# Patient Record
Sex: Female | Born: 1950 | Race: White | Hispanic: No | Marital: Married | Smoking: Former smoker
Health system: Southern US, Community
[De-identification: ages and names within clinical notes are randomized; demographics above are authoritative.]

## PROBLEM LIST (undated history)

## (undated) DIAGNOSIS — M1990 Unspecified osteoarthritis, unspecified site: Secondary | ICD-10-CM

## (undated) DIAGNOSIS — G4730 Sleep apnea, unspecified: Secondary | ICD-10-CM

## (undated) DIAGNOSIS — I10 Essential (primary) hypertension: Secondary | ICD-10-CM

## (undated) DIAGNOSIS — Z8709 Personal history of other diseases of the respiratory system: Secondary | ICD-10-CM

## (undated) DIAGNOSIS — U071 COVID-19: Secondary | ICD-10-CM

## (undated) DIAGNOSIS — K219 Gastro-esophageal reflux disease without esophagitis: Secondary | ICD-10-CM

## (undated) DIAGNOSIS — E119 Type 2 diabetes mellitus without complications: Secondary | ICD-10-CM

## (undated) DIAGNOSIS — M2550 Pain in unspecified joint: Secondary | ICD-10-CM

## (undated) DIAGNOSIS — Z9289 Personal history of other medical treatment: Secondary | ICD-10-CM

## (undated) DIAGNOSIS — M2540 Effusion, unspecified joint: Secondary | ICD-10-CM

## (undated) DIAGNOSIS — R7303 Prediabetes: Secondary | ICD-10-CM

## (undated) DIAGNOSIS — R350 Frequency of micturition: Secondary | ICD-10-CM

## (undated) DIAGNOSIS — R3915 Urgency of urination: Secondary | ICD-10-CM

## (undated) DIAGNOSIS — J189 Pneumonia, unspecified organism: Secondary | ICD-10-CM

## (undated) DIAGNOSIS — K5900 Constipation, unspecified: Secondary | ICD-10-CM

## (undated) DIAGNOSIS — E785 Hyperlipidemia, unspecified: Secondary | ICD-10-CM

## (undated) DIAGNOSIS — F419 Anxiety disorder, unspecified: Secondary | ICD-10-CM

## (undated) DIAGNOSIS — R0600 Dyspnea, unspecified: Secondary | ICD-10-CM

## (undated) HISTORY — PX: CHOLECYSTECTOMY: SHX55

## (undated) HISTORY — PX: ABDOMINAL HYSTERECTOMY: SHX81

## (undated) HISTORY — PX: APPENDECTOMY: SHX54

## (undated) HISTORY — PX: TONSILLECTOMY: SUR1361

## (undated) HISTORY — PX: OTHER SURGICAL HISTORY: SHX169

## (undated) HISTORY — PX: COLONOSCOPY: SHX174

## (undated) HISTORY — PX: CATARACT EXTRACTION W/ INTRAOCULAR LENS IMPLANT: SHX1309

---
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
## 1970-03-31 HISTORY — PX: ABDOMINAL HYSTERECTOMY: SHX81

## 2014-05-11 ENCOUNTER — Other Ambulatory Visit: Payer: Self-pay | Admitting: Orthopaedic Surgery

## 2014-05-18 NOTE — Pre-Procedure Instructions (Signed)
Karen Baker  05/18/2014   Your procedure is scheduled on:  Tues, Mar 1 @ 12:40 PM  Report to [facility]  at 10:40 AM.  Call this number if you have problems the morning of surgery: [phone]   Remember:   Do not eat food or drink liquids after midnight.   Take these medicines the morning of surgery with A SIP OF WATER: Alprazolam(Xanax),Amlodipine(Norvasc),Pain Pill(if needed),and Omeprazole(Prilosec)              Stop taking your Naproxen. No Goody's,BC's,Aspirin,Ibuprofen,Fish Oil,or any Herbal Medications   Do not wear jewelry, make-up or nail polish.  Do not wear lotions, powders, or perfumes. You may wear deodorant.  Do not shave 48 hours prior to surgery.   Do not bring valuables to the hospital.  [facility] is not responsible                  for any belongings or valuables.               Contacts, dentures or bridgework may not be worn into surgery.  Leave suitcase in the car. After surgery it may be brought to your room.  For patients admitted to the hospital, discharge time is determined by your                treatment team.                   Special Instructions:    Please read over the following fact sheets that you were given: Pain Booklet, Coughing and Deep Breathing, MRSA Information and Surgical Site Infection Prevention

## 2014-05-19 ENCOUNTER — Encounter (HOSPITAL_COMMUNITY): Payer: Self-pay

## 2014-05-19 ENCOUNTER — Encounter (HOSPITAL_COMMUNITY)
Admission: RE | Admit: 2014-05-19 | Discharge: 2014-05-19 | Disposition: A | Discharge status: Home / Self Care | Payer: Medicare PPO | Source: Ambulatory Visit | Attending: Orthopaedic Surgery | Admitting: Orthopaedic Surgery

## 2014-05-19 DIAGNOSIS — Z87891 Personal history of nicotine dependence: Secondary | ICD-10-CM | POA: Insufficient documentation

## 2014-05-19 DIAGNOSIS — I10 Essential (primary) hypertension: Secondary | ICD-10-CM | POA: Insufficient documentation

## 2014-05-19 DIAGNOSIS — Z01818 Encounter for other preprocedural examination: Secondary | ICD-10-CM

## 2014-05-19 DIAGNOSIS — F419 Anxiety disorder, unspecified: Secondary | ICD-10-CM | POA: Insufficient documentation

## 2014-05-19 DIAGNOSIS — M1612 Unilateral primary osteoarthritis, left hip: Secondary | ICD-10-CM | POA: Insufficient documentation

## 2014-05-19 DIAGNOSIS — R001 Bradycardia, unspecified: Secondary | ICD-10-CM | POA: Insufficient documentation

## 2014-05-19 DIAGNOSIS — E785 Hyperlipidemia, unspecified: Secondary | ICD-10-CM | POA: Diagnosis not present

## 2014-05-19 DIAGNOSIS — Z01812 Encounter for preprocedural laboratory examination: Secondary | ICD-10-CM | POA: Insufficient documentation

## 2014-05-19 DIAGNOSIS — Z79899 Other long term (current) drug therapy: Secondary | ICD-10-CM | POA: Insufficient documentation

## 2014-05-19 DIAGNOSIS — K219 Gastro-esophageal reflux disease without esophagitis: Secondary | ICD-10-CM | POA: Diagnosis not present

## 2014-05-19 DIAGNOSIS — I4510 Unspecified right bundle-branch block: Secondary | ICD-10-CM | POA: Insufficient documentation

## 2014-05-19 HISTORY — DX: Essential (primary) hypertension: I10

## 2014-05-19 HISTORY — DX: Hyperlipidemia, unspecified: E78.5

## 2014-05-19 HISTORY — DX: Pain in unspecified joint: M25.50

## 2014-05-19 HISTORY — DX: Anxiety disorder, unspecified: F41.9

## 2014-05-19 HISTORY — DX: Effusion, unspecified joint: M25.40

## 2014-05-19 HISTORY — DX: Unspecified osteoarthritis, unspecified site: M19.90

## 2014-05-19 HISTORY — DX: Frequency of micturition: R35.0

## 2014-05-19 HISTORY — DX: Gastro-esophageal reflux disease without esophagitis: K21.9

## 2014-05-19 HISTORY — DX: Personal history of other medical treatment: Z92.89

## 2014-05-19 HISTORY — DX: Constipation, unspecified: K59.00

## 2014-05-19 HISTORY — DX: Personal history of other diseases of the respiratory system: Z87.09

## 2014-05-19 HISTORY — DX: Urgency of urination: R39.15

## 2014-05-19 LAB — BASIC METABOLIC PANEL
Anion gap: 8 (ref 5–15)
BUN: 14 mg/dL (ref 6–23)
CALCIUM: 9.8 mg/dL (ref 8.4–10.5)
CO2: 26 mmol/L (ref 19–32)
Chloride: 105 mmol/L (ref 96–112)
Creatinine, Ser: 0.67 mg/dL (ref 0.50–1.10)
GFR calc Af Amer: >90 mL/min (ref >90)
GFR calc non Af Amer: >90 mL/min (ref >90)
GLUCOSE: 97 mg/dL (ref 70–99)
POTASSIUM: 4.5 mmol/L (ref 3.5–5.1)
Sodium: 139 mmol/L (ref 135–145)

## 2014-05-19 LAB — SURGICAL PCR SCREEN
MRSA, PCR: NEGATIVE
Staphylococcus aureus: POSITIVE — AB

## 2014-05-19 LAB — URINALYSIS, ROUTINE W REFLEX MICROSCOPIC
BILIRUBIN URINE: NEGATIVE
Glucose, UA: NEGATIVE mg/dL
Hgb urine dipstick: NEGATIVE
Ketones, ur: NEGATIVE mg/dL
Leukocytes, UA: NEGATIVE
NITRITE: NEGATIVE
PROTEIN: NEGATIVE mg/dL
SPECIFIC GRAVITY, URINE: 1.017 (ref 1.005–1.030)
UROBILINOGEN UA: 0.2 mg/dL (ref 0.0–1.0)
pH: 5.5 (ref 5.0–8.0)

## 2014-05-19 LAB — CBC WITH DIFFERENTIAL/PLATELET
Basophils Absolute: 0.1 10*3/uL (ref 0.0–0.1)
Basophils Relative: 1 % (ref 0–1)
EOS ABS: 0.4 10*3/uL (ref 0.0–0.7)
EOS PCT: 4 % (ref 0–5)
HCT: 41.3 % (ref 36.0–46.0)
Hemoglobin: 14.2 g/dL (ref 12.0–15.0)
LYMPHS ABS: 2.9 10*3/uL (ref 0.7–4.0)
Lymphocytes Relative: 27 % (ref 12–46)
MCH: 30 pg (ref 26.0–34.0)
MCHC: 34.4 g/dL (ref 30.0–36.0)
MCV: 87.1 fL (ref 78.0–100.0)
MONO ABS: 0.9 10*3/uL (ref 0.1–1.0)
Monocytes Relative: 9 % (ref 3–12)
NEUTROS ABS: 6.4 10*3/uL (ref 1.7–7.7)
NEUTROS PCT: 59 % (ref 43–77)
PLATELETS: 264 10*3/uL (ref 150–400)
RBC: 4.74 MIL/uL (ref 3.87–5.11)
RDW: 12.7 % (ref 11.5–15.5)
WBC: 10.7 10*3/uL — ABNORMAL HIGH (ref 4.0–10.5)

## 2014-05-19 LAB — PROTIME-INR
INR: 1.04 (ref 0.00–1.49)
PROTHROMBIN TIME: 13.7 s (ref 11.6–15.2)

## 2014-05-19 LAB — APTT: APTT: 37 s (ref 24–37)

## 2014-05-19 MED ORDER — CHLORHEXIDINE GLUCONATE 4 % EX LIQD: 60.0000 mL | Freq: Once | CUTANEOUS | Status: DC

## 2014-05-19 NOTE — Progress Notes (Signed)
Mupirocin script called into [facility] in [place]

## 2014-05-19 NOTE — Progress Notes (Addendum)
Medical Md is Dr.Sami Sheryle Hail  Pt doesn't have a cardiologist  Denies EKG or CXR in past yr  Denies ever having an echo/stress test/heart cath

## 2014-05-23 NOTE — Progress Notes (Addendum)
Anesthesia Chart Review:  Patient is a 64 year old female scheduled for left THA, anterior approach on 05/30/14 by Dr. Rhona Raider.  History includes former smoker, HTN, HLD, anxiety, GERD, arthritis, hysterectomy, tonsillectomy. PCP is Dr. Antonietta Jewel [phone]).   Meds Xanax, amlodipine, lovastatin, omeprazole.   EKG 05/19/14: SB at 59 bpm, LAD/LAFB, right BBB, minimal voltage criteria for LVH, may be normal variant, pulmonary disease pattern. No old tracing to compare in [place], or at her PCP office.   05/19/14 CXR: 1. Left base pleural-parenchymal scarring. 2. No acute cardiopulmonary disease.  Preoperative labs noted.   I requested prior EKG if available from [facility]. Records are pending.  In the interim, I did review her PAT EKG with Dr. Jenita Seashore. If patient is asymptomatic from a CV standpoint then would anticipate that she could proceed as planned.  I'll follow-up once I hear back from [facility].  [facility] Short Stay Center/Anesthesiology Phone [phone] 05/23/2014 5:28 PM  Addendum: Stress echo from 03/26/09 received from [facility] with results showing: Normal exercise tolerance, inconclusive EKG due to underlying conduction delay, negative stress echo for ischemia. Baseline EKG was noted to have SR with right BBB, LAFB, bifascicular block which would be consistent with her 05/19/14 EKG. Echo portion showed impaired LV relaxation and mild TR, EF 60%.  [facility] Short Stay Center/Anesthesiology Phone [phone] 05/24/2014 9:28 AM

## 2014-05-26 NOTE — H&P (Signed)
TOTAL HIP ADMISSION H&P  Patient is admitted for left total hip arthroplasty.  Subjective:  Chief Complaint: left hip pain  HPI: [facility], 64 y.o. female, has a history of pain and functional disability in the left hip(s) due to arthritis and patient has failed non-surgical conservative treatments for greater than 12 weeks to include NSAID's and/or analgesics, flexibility and strengthening excercises, use of assistive devices, weight reduction as appropriate and activity modification.  Onset of symptoms was gradual starting 5 years ago with gradually worsening course since that time.The patient noted no past surgery on the left hip(s).  Patient currently rates pain in the left hip at 10 out of 10 with activity. Patient has night pain, worsening of pain with activity and weight bearing, pain that interfers with activities of daily living and crepitus. Patient has evidence of subchondral cysts, subchondral sclerosis, periarticular osteophytes and joint space narrowing by imaging studies. This condition presents safety issues increasing the risk of falls. There is no current active infection.  There are no active problems to display for this patient.  Past Medical History  Diagnosis Date  . Hypertension     takes Amlodipine daily  . Anxiety     takes Xanax daily  . Constipation     takes Colace daily  . Hyperlipidemia     takes Lovastatin daily  . GERD (gastroesophageal reflux disease)     takes Omeprazole daily  . History of bronchitis 3-55yrs ago  . Arthritis   . Joint pain   . Joint swelling   . Urinary frequency   . Urinary urgency   . History of blood transfusion     no abnormal reaction noted    Past Surgical History  Procedure Laterality Date  . Cyst removed from ovary    . Tonsillectomy    . 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 Knots removed from neck    . Knee athroscopy Right   . Abdominal hysterectomy    . Appendectomy    . Colonoscopy      No  prescriptions prior to admission   Allergies  Allergen Reactions  . Aspirin     States she just doesn't take it.    History  Substance Use Topics  . Smoking status: Former Smoker -- 1.00 packs/day for 20 years  . Smokeless tobacco: Not on file     Comment: no cigs in over a week  . Alcohol Use: No    No family history on file.   Review of Systems  Musculoskeletal: Positive for joint pain.       Left hip  All other systems reviewed and are negative.   Objective:  Physical Exam  Constitutional: She is oriented to person, place, and time. She appears well-developed and well-nourished.  HENT:  Head: Normocephalic and atraumatic.  Eyes: Conjunctivae are normal. Pupils are equal, round, and reactive to light.  Neck: Normal range of motion.  Cardiovascular: Normal rate and regular rhythm.   Respiratory: Effort normal.  GI: Soft.  Musculoskeletal:  Left hip motion is quite limited.  She has extreme pain on internal rotation.  Her leg lengths seem about equal.  Sensation and motor function are intact in her feet with palpable pulses on both sides.  She has normal unlabored respirations.  There is no palpable lymphadenopathy about either groin area  Neurological: She is alert and oriented to person, place, and time.  Skin: Skin is warm and dry.  Psychiatric: She has a normal  mood and affect. Her behavior is normal. Judgment and thought content normal.    Vital signs in last 24 hours:    Labs:   There is no height or weight on file to calculate BMI.   Imaging Review Plain radiographs demonstrate severe degenerative joint disease of the left hip(s). The bone quality appears to be good for age and reported activity level.  Assessment/Plan:  End stage primary arthritis, left hip(s)  The patient history, physical examination, clinical judgement of the provider and imaging studies are consistent with end stage degenerative joint disease of the left hip(s) and total hip  arthroplasty is deemed medically necessary. The treatment options including medical management, injection therapy, arthroscopy and arthroplasty were discussed at length. The risks and benefits of total hip arthroplasty were presented and reviewed. The risks due to aseptic loosening, infection, stiffness, dislocation/subluxation,  thromboembolic complications and other imponderables were discussed.  The patient acknowledged the explanation, agreed to proceed with the plan and consent was signed. Patient is being admitted for inpatient treatment for surgery, pain control, PT, OT, prophylactic antibiotics, VTE prophylaxis, progressive ambulation and ADL's and discharge planning.The patient is planning to be discharged home with home health services

## 2014-05-29 MED ORDER — CEFAZOLIN SODIUM-DEXTROSE 2-3 GM-% IV SOLR
2.0000 g | INTRAVENOUS | Status: AC
  Administered 2014-05-30: 2 g via INTRAVENOUS
  Filled 2014-05-29: qty 50

## 2014-05-29 MED ORDER — LACTATED RINGERS IV SOLN
INTRAVENOUS | Status: DC
  Administered 2014-05-30: 10:00:00 via INTRAVENOUS

## 2014-05-29 NOTE — Progress Notes (Signed)
Pt notified of time change;to arrive at [facility]

## 2014-05-30 ENCOUNTER — Encounter (HOSPITAL_COMMUNITY): Payer: Self-pay | Admitting: *Deleted

## 2014-05-30 ENCOUNTER — Encounter (HOSPITAL_COMMUNITY)
Admission: RE | Disposition: A | Discharge status: Home / Self Care | Payer: Self-pay | Source: Ambulatory Visit | Attending: Orthopaedic Surgery

## 2014-05-30 ENCOUNTER — Inpatient Hospital Stay (HOSPITAL_COMMUNITY): Payer: Medicare PPO | Admitting: Anesthesiology

## 2014-05-30 ENCOUNTER — Inpatient Hospital Stay (HOSPITAL_COMMUNITY)
Admission: RE | Admit: 2014-05-30 | Discharge: 2014-06-01 | DRG: 470 | Disposition: A | Discharge status: Home / Self Care | Payer: Medicare PPO | Source: Ambulatory Visit | Attending: Orthopaedic Surgery | Admitting: Orthopaedic Surgery

## 2014-05-30 ENCOUNTER — Inpatient Hospital Stay (HOSPITAL_COMMUNITY): Payer: Medicare PPO | Admitting: Vascular Surgery

## 2014-05-30 ENCOUNTER — Inpatient Hospital Stay (HOSPITAL_COMMUNITY): Payer: Medicare PPO

## 2014-05-30 DIAGNOSIS — F419 Anxiety disorder, unspecified: Secondary | ICD-10-CM | POA: Diagnosis not present

## 2014-05-30 DIAGNOSIS — E785 Hyperlipidemia, unspecified: Secondary | ICD-10-CM | POA: Diagnosis present

## 2014-05-30 DIAGNOSIS — Z90710 Acquired absence of both cervix and uterus: Secondary | ICD-10-CM | POA: Diagnosis not present

## 2014-05-30 DIAGNOSIS — Z886 Allergy status to analgesic agent status: Secondary | ICD-10-CM | POA: Diagnosis not present

## 2014-05-30 DIAGNOSIS — K219 Gastro-esophageal reflux disease without esophagitis: Secondary | ICD-10-CM | POA: Diagnosis present

## 2014-05-30 DIAGNOSIS — I10 Essential (primary) hypertension: Secondary | ICD-10-CM | POA: Diagnosis present

## 2014-05-30 DIAGNOSIS — Z87891 Personal history of nicotine dependence: Secondary | ICD-10-CM | POA: Diagnosis not present

## 2014-05-30 DIAGNOSIS — M1612 Unilateral primary osteoarthritis, left hip: Principal | ICD-10-CM | POA: Diagnosis present

## 2014-05-30 DIAGNOSIS — M25552 Pain in left hip: Secondary | ICD-10-CM | POA: Diagnosis present

## 2014-05-30 DIAGNOSIS — Z9049 Acquired absence of other specified parts of digestive tract: Secondary | ICD-10-CM | POA: Diagnosis present

## 2014-05-30 DIAGNOSIS — Z419 Encounter for procedure for purposes other than remedying health state, unspecified: Secondary | ICD-10-CM

## 2014-05-30 HISTORY — DX: Unilateral primary osteoarthritis, left hip: M16.12

## 2014-05-30 HISTORY — PX: TOTAL HIP ARTHROPLASTY: SHX124

## 2014-05-30 SURGERY — ARTHROPLASTY, HIP, TOTAL, ANTERIOR APPROACH
Anesthesia: Spinal | Site: Hip | Laterality: Left

## 2014-05-30 MED ORDER — PANTOPRAZOLE SODIUM 40 MG PO TBEC
40.0000 mg | DELAYED_RELEASE_TABLET | Freq: Every day | ORAL | Status: DC
Start: 2014-05-31 — End: 2014-06-01
  Administered 2014-05-31 – 2014-06-01 (×2): 40 mg via ORAL
  Filled 2014-05-30 (×2): qty 1

## 2014-05-30 MED ORDER — PROPOFOL 10 MG/ML IV BOLUS
INTRAVENOUS | Status: DC | PRN
  Administered 2014-05-30: 20 mg via INTRAVENOUS

## 2014-05-30 MED ORDER — METOCLOPRAMIDE HCL 10 MG PO TABS: 5.0000 mg | ORAL_TABLET | Freq: Three times a day (TID) | ORAL | Status: DC | PRN

## 2014-05-30 MED ORDER — TRANEXAMIC ACID 100 MG/ML IV SOLN
1000.0000 mg | INTRAVENOUS | Status: AC
  Administered 2014-05-30: 1000 mg via INTRAVENOUS
  Filled 2014-05-30: qty 10

## 2014-05-30 MED ORDER — ALPRAZOLAM 0.5 MG PO TABS
0.5000 mg | ORAL_TABLET | Freq: Two times a day (BID) | ORAL | Status: DC
  Administered 2014-05-30 – 2014-06-01 (×4): 0.5 mg via ORAL
  Filled 2014-05-30 (×4): qty 1

## 2014-05-30 MED ORDER — ONDANSETRON HCL 4 MG/2ML IJ SOLN: 4.0000 mg | Freq: Once | INTRAMUSCULAR | Status: DC | PRN

## 2014-05-30 MED ORDER — PROPOFOL 10 MG/ML IV BOLUS
INTRAVENOUS | Status: AC
  Filled 2014-05-30: qty 20

## 2014-05-30 MED ORDER — ONDANSETRON HCL 4 MG/2ML IJ SOLN
INTRAMUSCULAR | Status: AC
  Filled 2014-05-30: qty 2

## 2014-05-30 MED ORDER — SODIUM CHLORIDE 0.9 % IV SOLN
10.0000 mg | INTRAVENOUS | Status: DC | PRN
  Administered 2014-05-30: 10 ug/min via INTRAVENOUS

## 2014-05-30 MED ORDER — PHENYLEPHRINE HCL 10 MG/ML IJ SOLN
INTRAMUSCULAR | Status: AC
  Filled 2014-05-30: qty 1

## 2014-05-30 MED ORDER — PROPOFOL INFUSION 10 MG/ML OPTIME
INTRAVENOUS | Status: DC | PRN
  Administered 2014-05-30: 100 ug/kg/min via INTRAVENOUS

## 2014-05-30 MED ORDER — METHOCARBAMOL 1000 MG/10ML IJ SOLN
500.0000 mg | Freq: Four times a day (QID) | INTRAMUSCULAR | Status: DC | PRN
  Administered 2014-05-30: 500 mg via INTRAVENOUS
  Filled 2014-05-30 (×3): qty 5

## 2014-05-30 MED ORDER — MEPERIDINE HCL 25 MG/ML IJ SOLN: 6.2500 mg | INTRAMUSCULAR | Status: DC | PRN

## 2014-05-30 MED ORDER — DIPHENHYDRAMINE HCL 12.5 MG/5ML PO ELIX: 12.5000 mg | ORAL_SOLUTION | ORAL | Status: DC | PRN

## 2014-05-30 MED ORDER — AMLODIPINE BESYLATE 5 MG PO TABS
5.0000 mg | ORAL_TABLET | Freq: Every day | ORAL | Status: DC
Start: 2014-05-31 — End: 2014-06-01
  Administered 2014-05-31 – 2014-06-01 (×2): 5 mg via ORAL
  Filled 2014-05-30 (×2): qty 1

## 2014-05-30 MED ORDER — METOCLOPRAMIDE HCL 5 MG/ML IJ SOLN: 5.0000 mg | Freq: Three times a day (TID) | INTRAMUSCULAR | Status: DC | PRN

## 2014-05-30 MED ORDER — ACETAMINOPHEN 650 MG RE SUPP: 650.0000 mg | Freq: Four times a day (QID) | RECTAL | Status: DC | PRN

## 2014-05-30 MED ORDER — MIDAZOLAM HCL 2 MG/2ML IJ SOLN
INTRAMUSCULAR | Status: AC
  Filled 2014-05-30: qty 2

## 2014-05-30 MED ORDER — ROCURONIUM BROMIDE 50 MG/5ML IV SOLN
INTRAVENOUS | Status: AC
  Filled 2014-05-30: qty 1

## 2014-05-30 MED ORDER — ALUM & MAG HYDROXIDE-SIMETH 200-200-20 MG/5ML PO SUSP
30.0000 mL | ORAL | Status: DC | PRN
  Administered 2014-05-30: 30 mL via ORAL
  Filled 2014-05-30: qty 30

## 2014-05-30 MED ORDER — FENTANYL CITRATE 0.05 MG/ML IJ SOLN
INTRAMUSCULAR | Status: DC | PRN
  Administered 2014-05-30: 50 ug via INTRAVENOUS

## 2014-05-30 MED ORDER — DOCUSATE SODIUM 100 MG PO CAPS
100.0000 mg | ORAL_CAPSULE | Freq: Two times a day (BID) | ORAL | Status: DC
  Administered 2014-05-30 – 2014-06-01 (×4): 100 mg via ORAL
  Filled 2014-05-30 (×4): qty 1

## 2014-05-30 MED ORDER — HYDROMORPHONE HCL 1 MG/ML IJ SOLN
0.2500 mg | INTRAMUSCULAR | Status: DC | PRN
  Administered 2014-05-30 (×3): 0.5 mg via INTRAVENOUS

## 2014-05-30 MED ORDER — CEFAZOLIN SODIUM-DEXTROSE 2-3 GM-% IV SOLR
2.0000 g | Freq: Four times a day (QID) | INTRAVENOUS | Status: AC
  Administered 2014-05-30 – 2014-05-31 (×2): 2 g via INTRAVENOUS
  Filled 2014-05-30 (×2): qty 50

## 2014-05-30 MED ORDER — ONDANSETRON HCL 4 MG/2ML IJ SOLN: 4.0000 mg | Freq: Four times a day (QID) | INTRAMUSCULAR | Status: DC | PRN

## 2014-05-30 MED ORDER — PRAVASTATIN SODIUM 20 MG PO TABS
20.0000 mg | ORAL_TABLET | Freq: Every day | ORAL | Status: DC
  Administered 2014-05-30 – 2014-05-31 (×2): 20 mg via ORAL
  Filled 2014-05-30 (×4): qty 1

## 2014-05-30 MED ORDER — ASPIRIN EC 325 MG PO TBEC
325.0000 mg | DELAYED_RELEASE_TABLET | Freq: Two times a day (BID) | ORAL | Status: DC
  Administered 2014-05-31 – 2014-06-01 (×3): 325 mg via ORAL
  Filled 2014-05-30 (×3): qty 1

## 2014-05-30 MED ORDER — 0.9 % SODIUM CHLORIDE (POUR BTL) OPTIME
TOPICAL | Status: DC | PRN
  Administered 2014-05-30: 1000 mL

## 2014-05-30 MED ORDER — METHOCARBAMOL 500 MG PO TABS
500.0000 mg | ORAL_TABLET | Freq: Four times a day (QID) | ORAL | Status: DC | PRN
  Administered 2014-05-30 – 2014-05-31 (×2): 500 mg via ORAL
  Filled 2014-05-30 (×3): qty 1

## 2014-05-30 MED ORDER — HYDROMORPHONE HCL 1 MG/ML IJ SOLN
INTRAMUSCULAR | Status: AC
  Administered 2014-05-30: 0.5 mg via INTRAVENOUS
  Filled 2014-05-30: qty 1

## 2014-05-30 MED ORDER — PHENOL 1.4 % MT LIQD: 1.0000 | OROMUCOSAL | Status: DC | PRN

## 2014-05-30 MED ORDER — MENTHOL 3 MG MT LOZG: 1.0000 | LOZENGE | OROMUCOSAL | Status: DC | PRN

## 2014-05-30 MED ORDER — ONDANSETRON HCL 4 MG PO TABS: 4.0000 mg | ORAL_TABLET | Freq: Four times a day (QID) | ORAL | Status: DC | PRN

## 2014-05-30 MED ORDER — MIDAZOLAM HCL 5 MG/5ML IJ SOLN
INTRAMUSCULAR | Status: DC | PRN
  Administered 2014-05-30: 2 mg via INTRAVENOUS

## 2014-05-30 MED ORDER — HYDROMORPHONE HCL 1 MG/ML IJ SOLN
0.5000 mg | INTRAMUSCULAR | Status: DC | PRN
  Administered 2014-05-30 (×2): 1 mg via INTRAVENOUS
  Filled 2014-05-30 (×2): qty 1

## 2014-05-30 MED ORDER — LACTATED RINGERS IV SOLN
INTRAVENOUS | Status: DC
  Administered 2014-05-30 – 2014-05-31 (×3): via INTRAVENOUS

## 2014-05-30 MED ORDER — BISACODYL 5 MG PO TBEC: 5.0000 mg | DELAYED_RELEASE_TABLET | Freq: Every day | ORAL | Status: DC | PRN

## 2014-05-30 MED ORDER — LIDOCAINE HCL (CARDIAC) 20 MG/ML IV SOLN
INTRAVENOUS | Status: AC
  Filled 2014-05-30: qty 5

## 2014-05-30 MED ORDER — FENTANYL CITRATE 0.05 MG/ML IJ SOLN
INTRAMUSCULAR | Status: AC
  Filled 2014-05-30: qty 5

## 2014-05-30 MED ORDER — HYDROCODONE-ACETAMINOPHEN 10-325 MG PO TABS
1.0000 | ORAL_TABLET | ORAL | Status: DC | PRN
  Administered 2014-05-30 (×2): 1 via ORAL
  Administered 2014-05-31 (×3): 2 via ORAL
  Administered 2014-05-31: 1 via ORAL
  Administered 2014-06-01: 2 via ORAL
  Administered 2014-06-01: 1 via ORAL
  Filled 2014-05-30 (×3): qty 2
  Filled 2014-05-30 (×4): qty 1
  Filled 2014-05-30: qty 2

## 2014-05-30 MED ORDER — ACETAMINOPHEN 325 MG PO TABS
650.0000 mg | ORAL_TABLET | Freq: Four times a day (QID) | ORAL | Status: DC | PRN
Start: 2014-05-30 — End: 2014-06-01

## 2014-05-30 MED ORDER — HYDROMORPHONE HCL 1 MG/ML IJ SOLN
INTRAMUSCULAR | Status: AC
  Filled 2014-05-30: qty 1

## 2014-05-30 SURGICAL SUPPLY — 50 items
BENZOIN TINCTURE PRP APPL 2/3 (GAUZE/BANDAGES/DRESSINGS) ×2 IMPLANT
BLADE SAW SGTL 18X1.27X75 (BLADE) ×2 IMPLANT
BLADE SURG ROTATE 9660 (MISCELLANEOUS) IMPLANT
CAPT HIP TOTAL 2 ×2 IMPLANT
CELLS DAT CNTRL 66122 CELL SVR (MISCELLANEOUS) ×1 IMPLANT
CLSR STERI-STRIP ANTIMIC 1/2X4 (GAUZE/BANDAGES/DRESSINGS) ×2 IMPLANT
COVER PERINEAL POST (MISCELLANEOUS) ×2 IMPLANT
COVER SURGICAL LIGHT HANDLE (MISCELLANEOUS) ×2 IMPLANT
DRAPE C-ARM 42X72 X-RAY (DRAPES) ×2 IMPLANT
DRAPE IMP U-DRAPE 54X76 (DRAPES) ×2 IMPLANT
DRAPE STERI IOBAN 125X83 (DRAPES) ×2 IMPLANT
DRAPE U-SHAPE 47X51 STRL (DRAPES) ×6 IMPLANT
DRSG AQUACEL AG ADV 3.5X10 (GAUZE/BANDAGES/DRESSINGS) ×2 IMPLANT
DRSG AQUACEL AG ADV 3.5X14 (GAUZE/BANDAGES/DRESSINGS) ×2 IMPLANT
DURAPREP 26ML APPLICATOR (WOUND CARE) ×2 IMPLANT
ELECT BLADE 4.0 EZ CLEAN MEGAD (MISCELLANEOUS)
ELECT CAUTERY BLADE 6.4 (BLADE) ×2 IMPLANT
ELECT REM PT RETURN 9FT ADLT (ELECTROSURGICAL) ×2
ELECTRODE BLDE 4.0 EZ CLN MEGD (MISCELLANEOUS) IMPLANT
ELECTRODE REM PT RTRN 9FT ADLT (ELECTROSURGICAL) ×1 IMPLANT
FACESHIELD WRAPAROUND (MASK) ×4 IMPLANT
GLOVE BIO SURGEON STRL SZ8 (GLOVE) ×10 IMPLANT
GLOVE BIOGEL PI IND STRL 8 (GLOVE) ×2 IMPLANT
GLOVE BIOGEL PI INDICATOR 8 (GLOVE) ×2
GOWN STRL REUS W/ TWL LRG LVL3 (GOWN DISPOSABLE) ×1 IMPLANT
GOWN STRL REUS W/ TWL XL LVL3 (GOWN DISPOSABLE) ×2 IMPLANT
GOWN STRL REUS W/TWL LRG LVL3 (GOWN DISPOSABLE) ×1
GOWN STRL REUS W/TWL XL LVL3 (GOWN DISPOSABLE) ×2
KIT BASIN OR (CUSTOM PROCEDURE TRAY) ×2 IMPLANT
KIT ROOM TURNOVER OR (KITS) ×2 IMPLANT
LINER BOOT UNIVERSAL DISP (MISCELLANEOUS) ×2 IMPLANT
MANIFOLD NEPTUNE II (INSTRUMENTS) ×2 IMPLANT
NS IRRIG 1000ML POUR BTL (IV SOLUTION) ×2 IMPLANT
PACK TOTAL JOINT (CUSTOM PROCEDURE TRAY) ×2 IMPLANT
PACK UNIVERSAL I (CUSTOM PROCEDURE TRAY) ×2 IMPLANT
PAD ARMBOARD 7.5X6 YLW CONV (MISCELLANEOUS) ×4 IMPLANT
RTRCTR WOUND ALEXIS 18CM MED (MISCELLANEOUS) ×2
STAPLER VISISTAT 35W (STAPLE) ×2 IMPLANT
SUT ETHIBOND NAB CT1 #1 30IN (SUTURE) ×6 IMPLANT
SUT VIC AB 0 CT1 27 (SUTURE)
SUT VIC AB 0 CT1 27XBRD ANBCTR (SUTURE) IMPLANT
SUT VIC AB 1 CT1 27 (SUTURE) ×1
SUT VIC AB 1 CT1 27XBRD ANBCTR (SUTURE) ×1 IMPLANT
SUT VIC AB 2-0 CT1 27 (SUTURE) ×1
SUT VIC AB 2-0 CT1 TAPERPNT 27 (SUTURE) ×1 IMPLANT
SUT VLOC 180 0 24IN GS25 (SUTURE) ×2 IMPLANT
TOWEL OR 17X24 6PK STRL BLUE (TOWEL DISPOSABLE) ×2 IMPLANT
TOWEL OR 17X26 10 PK STRL BLUE (TOWEL DISPOSABLE) ×4 IMPLANT
TRAY FOLEY CATH 14FR (SET/KITS/TRAYS/PACK) IMPLANT
WATER STERILE IRR 1000ML POUR (IV SOLUTION) ×4 IMPLANT

## 2014-05-30 NOTE — Anesthesia Procedure Notes (Addendum)
Spinal Patient location during procedure: OR Start time: 05/30/2014 11:40 AM End time: 05/30/2014 11:45 AM Staffing Anesthesiologist: Lillia Abed Performed by: anesthesiologist  Preanesthetic Checklist Completed: patient identified, surgical consent, pre-op evaluation, timeout performed, IV checked, risks and benefits discussed and monitors and equipment checked Spinal Block Patient position: sitting Prep: ChloraPrep Patient monitoring: heart rate, cardiac monitor, continuous pulse ox and blood pressure Approach: right paramedian Location: L4-5 Injection technique: single-shot Needle Needle type: Pencan  Needle gauge: 24 G Needle length: 9 cm Needle insertion depth: 7 cm Assessment Sensory level: T6  Procedure Name: MAC Date/Time: 05/30/2014 11:58 AM Performed by: Rush Farmer E Pre-anesthesia Checklist: Patient identified, Emergency Drugs available, Suction available, Patient being monitored and Timeout performed Patient Re-evaluated:Patient Re-evaluated prior to inductionOxygen Delivery Method: Simple face mask Intubation Type: IV induction Placement Confirmation: positive ETCO2

## 2014-05-30 NOTE — Interval H&P Note (Signed)
OK for surgery PD 

## 2014-05-30 NOTE — Anesthesia Preprocedure Evaluation (Addendum)
Anesthesia Evaluation  Patient identified by MRN, date of birth, ID band Patient awake    Reviewed: Allergy & Precautions, NPO status , Patient's Chart, lab work & pertinent test results  Airway Mallampati: I  TM Distance: >3 FB Neck ROM: Full    Dental  (+) Partial Lower   Pulmonary Current Smoker, former smoker,          Cardiovascular hypertension, Pt. on medications     Neuro/Psych    GI/Hepatic GERD-  Medicated and Controlled,  Endo/Other    Renal/GU      Musculoskeletal   Abdominal   Peds  Hematology   Anesthesia Other Findings   Reproductive/Obstetrics                           Anesthesia Physical Anesthesia Plan  ASA: III  Anesthesia Plan: Spinal   Post-op Pain Management:    Induction: Intravenous  Airway Management Planned:   Additional Equipment:   Intra-op Plan:   Post-operative Plan:   Informed Consent: I have reviewed the patients History and Physical, chart, labs and discussed the procedure including the risks, benefits and alternatives for the proposed anesthesia with the patient or authorized representative who has indicated his/her understanding and acceptance.   Dental advisory given  Plan Discussed with: CRNA  Anesthesia Plan Comments:        Anesthesia Quick Evaluation

## 2014-05-30 NOTE — Transfer of Care (Signed)
Immediate Anesthesia Transfer of Care Note  Patient: Karen Baker  Procedure(s) Performed: Procedure(s): LEFT TOTAL HIP ARTHROPLASTY ANTERIOR APPROACH (Left)  Patient Location: PACU  Anesthesia Type:Spinal  Level of Consciousness: awake, alert  and oriented  Airway & Oxygen Therapy: Patient Spontanous Breathing and Patient connected to face mask oxygen  Post-op Assessment: Report given to RN and Post -op Vital signs reviewed and stable  Post vital signs: Reviewed and stable  Last Vitals:  Filed Vitals:   05/30/14 1349  BP:   Pulse:   Temp: 36.6 C  Resp:     Complications: No apparent anesthesia complications

## 2014-05-30 NOTE — Anesthesia Postprocedure Evaluation (Signed)
Anesthesia Post Note  Patient: Karen Baker  Procedure(s) Performed: Procedure(s) (LRB): LEFT TOTAL HIP ARTHROPLASTY ANTERIOR APPROACH (Left)  Anesthesia type: general  Patient location: PACU  Post pain: Pain level controlled  Post assessment: Patient's Cardiovascular Status Stable  Last Vitals:  Filed Vitals:   05/30/14 1534  BP: 122/76  Pulse: 65  Temp: 36.5 C  Resp: 18    Post vital signs: Reviewed and stable  Level of consciousness: sedated  Complications: No apparent anesthesia complications

## 2014-05-30 NOTE — Progress Notes (Signed)
Utilization review completed.  

## 2014-05-30 NOTE — Op Note (Signed)

## 2014-05-31 ENCOUNTER — Encounter (HOSPITAL_COMMUNITY): Payer: Self-pay | Admitting: Orthopaedic Surgery

## 2014-05-31 LAB — BASIC METABOLIC PANEL
Anion gap: 8 (ref 5–15)
BUN: 11 mg/dL (ref 6–23)
CHLORIDE: 99 mmol/L (ref 96–112)
CO2: 27 mmol/L (ref 19–32)
CREATININE: 0.65 mg/dL (ref 0.50–1.10)
Calcium: 8.6 mg/dL (ref 8.4–10.5)
GFR calc Af Amer: >90 mL/min (ref >90)
GFR calc non Af Amer: >90 mL/min (ref >90)
Glucose, Bld: 131 mg/dL — ABNORMAL HIGH (ref 70–99)
Potassium: 3.8 mmol/L (ref 3.5–5.1)
Sodium: 134 mmol/L — ABNORMAL LOW (ref 135–145)

## 2014-05-31 LAB — CBC
HEMATOCRIT: 30.3 % — AB (ref 36.0–46.0)
HEMOGLOBIN: 10.6 g/dL — AB (ref 12.0–15.0)
MCH: 30.7 pg (ref 26.0–34.0)
MCHC: 35 g/dL (ref 30.0–36.0)
MCV: 87.8 fL (ref 78.0–100.0)
Platelets: 203 10*3/uL (ref 150–400)
RBC: 3.45 MIL/uL — AB (ref 3.87–5.11)
RDW: 12.8 % (ref 11.5–15.5)
WBC: 10.8 10*3/uL — ABNORMAL HIGH (ref 4.0–10.5)

## 2014-05-31 MED ORDER — PNEUMOCOCCAL VAC POLYVALENT 25 MCG/0.5ML IJ INJ
0.5000 mL | INJECTION | INTRAMUSCULAR | Status: AC
  Administered 2014-06-01: 0.5 mL via INTRAMUSCULAR
  Filled 2014-05-31: qty 0.5

## 2014-05-31 NOTE — Care Management Note (Signed)
CARE MANAGEMENT NOTE 05/31/2014  Patient:  Haefner,Safira   Account Number:  [id number]  Date Initiated:  05/31/2014  Documentation initiated by:  Ricki Miller  Subjective/Objective Assessment:   64 yr old female admitted with DJD of left hip. Patient underwent a left total hip arthroplasty.     Action/Plan:   CM spoke with patient concerning home health and DME needs. Choice offered. Patient wants [facility]. CM called referral and faxed information to Abigail Butts [phone]. Patient has family support at Discharge.Has RW.   Anticipated DC Date:  06/01/2014   Anticipated DC Plan:  [place]  CM consult      [place]   Choice offered to / List presented to:  C-1 Patient   DME arranged  3-N-1      DME agency  [employer]     [place] arranged  HH-2 PT      [place] agency  [facility]   Status of service:  Completed, signed off Medicare Important Message given?  NA - LOS <3 / Initial given by admissions (If response is "NO", the following Medicare IM given date fields will be blank) Date Medicare IM given:   Medicare IM given by:   Date Additional Medicare IM given:   Additional Medicare IM given by:    Discharge Disposition:  [place]  Per UR Regulation:  Reviewed for med. necessity/level of care/duration of stay  If discussed at [place] of Stay Meetings, dates discussed:

## 2014-05-31 NOTE — Progress Notes (Signed)
Physical Therapy Treatment Patient Details Name: Karen Baker MRN: [id number] DOB: 10-18-1950 Today's Date: 05/31/2014    History of Present Illness Pt is a 64 y.o. female s/p Lt direct anterior THA.     PT Comments    Pt progressing with mobility and exercises. Pt hopeful to D/C home tomorrow. Patient needs to practice 1 step next session prior to D/C home.   Follow Up Recommendations  Home health PT;Supervision/Assistance - 24 hour     Equipment Recommendations  None recommended by PT    Recommendations for Other Services       Precautions / Restrictions Precautions Precautions: None Restrictions Weight Bearing Restrictions: Yes LLE Weight Bearing: Weight bearing as tolerated    Mobility  Bed Mobility Overal bed mobility: Needs Assistance Bed Mobility: Supine to Sit Rolling: Supervision   Supine to sit: Supervision     General bed mobility comments: cues for hooklying techique to bring Lt LE of EOB  Transfers Overall transfer level: Needs assistance Equipment used: Rolling walker (2 wheeled) Transfers: Sit to/from Stand Sit to Stand: Supervision         General transfer comment: cues for hand placement and safety   Ambulation/Gait Ambulation/Gait assistance: Min guard Ambulation Distance (Feet): 100 Feet Assistive device: Rolling walker (2 wheeled) Gait Pattern/deviations: Step-through pattern;Decreased stance time - left;Decreased step length - right;Antalgic;Narrow base of support Gait velocity: decr Gait velocity interpretation: Below normal speed for age/gender General Gait Details: cues for widened BOS and step through gt   Stairs            Wheelchair Mobility    Modified Rankin (Stroke Patients Only)       Balance Overall balance assessment: Needs assistance Sitting-balance support: Feet supported;No upper extremity supported Sitting balance-Leahy Scale: Good     Standing balance support: During functional activity;Bilateral  upper extremity supported Standing balance-Leahy Scale: Poor Standing balance comment: RW to balance                     Cognition Arousal/Alertness: Awake/alert Behavior During Therapy: WFL for tasks assessed/performed Overall Cognitive Status: Within Functional Limits for tasks assessed                      Exercises Total Joint Exercises Ankle Circles/Pumps: AROM;Both;10 reps;Seated Quad Sets: AROM;10 reps;Both Heel Slides: AAROM;Left;10 reps Hip ABduction/ADduction: AAROM;Left;10 reps Long Arc Quad: AROM;Left;10 reps;Seated    General Comments        Pertinent Vitals/Pain Pain Assessment: 0-10 Pain Score: 2  Pain Location: Lt hip  Pain Descriptors / Indicators: Sore Pain Intervention(s): Monitored during session;Premedicated before session;Repositioned    Home Living Family/patient expects to be discharged to:: Private residence Living Arrangements: Spouse/significant other;Children;Other relatives Available Help at Discharge: Family;Available 24 hours/day Type of Home: House Home Access: Stairs to enter Entrance Stairs-Rails: None Home Layout: One level Home Equipment: Environmental consultant - 2 wheels;Bedside commode      Prior Function Level of Independence: Independent          PT Goals (current goals can now be found in the care plan section) Acute Rehab PT Goals Patient Stated Goal: to go home tomorrow PT Goal Formulation: With patient Time For Goal Achievement: 06/03/14 Potential to Achieve Goals: Good Progress towards PT goals: Progressing toward goals    Frequency  7X/week    PT Plan Current plan remains appropriate    Co-evaluation             End of Session Equipment Utilized  During Treatment: Gait belt Activity Tolerance: Patient tolerated treatment well Patient left: in chair;with call bell/phone within reach;with family/visitor present     Time: 1341-1413 PT Time Calculation (min) (ACUTE ONLY): 32 min  Charges:  $Gait  Training: 8-22 mins $Therapeutic Exercise: 8-22 mins                    G CodesElie Confer Fonda, [place] 05/31/2014, 4:09 PM

## 2014-05-31 NOTE — Clinical Social Work Note (Signed)
CSW received referral for SNF plan is to discharge home with home health.  CSW to sign off please re-consult if social work needs arise.  Jones Broom. [place], MSW, [place]

## 2014-05-31 NOTE — Progress Notes (Signed)
Subjective: 1 Day Post-Op Procedure(s) (LRB): LEFT TOTAL HIP ARTHROPLASTY ANTERIOR APPROACH (Left)  Activity level:  WBAT Diet tolerance:  Eating well Voiding:  ok Patient reports pain as mild.    Objective: Vital signs in last 24 hours: Temp:  [97.5 F (36.4 C)-99.3 F (37.4 C)] 98.7 F (37.1 C) (03/02 0524) Pulse Rate:  [50-85] 85 (03/02 0524) Resp:  [9-20] 17 (03/02 0524) BP: (90-172)/(60-90) 102/63 mmHg (03/02 0524) SpO2:  [91 %-100 %] 91 % (03/02 0524) Weight:  [87.998 kg (194 lb)] 87.998 kg (194 lb) (03/01 0911)  Labs:  Recent Labs  05/31/14 0633  HGB 10.6*    Recent Labs  05/31/14 0633  WBC 10.8*  RBC 3.45*  HCT 30.3*  PLT 203    Recent Labs  05/31/14 0633  NA 134*  K 3.8  CL 99  CO2 27  BUN 11  CREATININE 0.65  GLUCOSE 131*  CALCIUM 8.6   No results for input(s): LABPT, INR in the last 72 hours.  Physical Exam:  Neurologically intact ABD soft Neurovascular intact Sensation intact distally Intact pulses distally Dorsiflexion/Plantar flexion intact Incision: dressing C/D/I and no drainage No cellulitis present Compartment soft  Assessment/Plan:  1 Day Post-Op Procedure(s) (LRB): LEFT TOTAL HIP ARTHROPLASTY ANTERIOR APPROACH (Left) Advance diet Up with therapy D/C IV fluids Plan for discharge tomorrow Discharge home with home health if doing well and cleared by PT. Continue on 325mg  ASA BID x 4 weeks post op. Follow up in office 2 weeks post op.   Shahrukh Pasch, Larwance Sachs 05/31/2014, 8:03 AM

## 2014-05-31 NOTE — Evaluation (Signed)
Occupational Therapy Evaluation Patient Details Name: Karen Baker MRN: [id number] DOB: 13-Sep-1950 Today's Date: 05/31/2014    History of Present Illness Pt is a 64 y.o. female s/p Lt direct anterior THA.    Clinical Impression   Patient independent PTA. Patient currently requires up to min assist for ADLs using RW and AE prn. Patient will benefit from acute OT to increase overall independence in the areas of ADLs, functional mobility, and overall safety in order to safely discharge home with family.     Follow Up Recommendations  No OT follow up;Supervision/Assistance - 24 hour    Equipment Recommendations  Other (comment) (AE - reacher, sock aid, LH sponge, LH shoe horn)    Recommendations for Other Services  None at this time     Precautions / Restrictions Precautions Precautions: None Restrictions Weight Bearing Restrictions: Yes LLE Weight Bearing: Weight bearing as tolerated      Mobility Bed Mobility Overal bed mobility: Needs Assistance Bed Mobility: Supine to Sit;Rolling Rolling: Supervision   Supine to sit: Supervision     General bed mobility comments: bed rails used  Transfers  See PT eval    Balance Overall balance assessment: Needs assistance Sitting-balance support: Feet supported;No upper extremity supported Sitting balance-Leahy Scale: Good        See PT eval for standing balance     ADL Overall ADL's : Needs assistance/impaired Eating/Feeding: Independent;Sitting   Grooming: Supervision/safety;Standing   Upper Body Bathing: Set up;Sitting   Lower Body Bathing: Minimal assistance;Sit to/from stand   Upper Body Dressing : Set up;Sitting   Lower Body Dressing: Minimal assistance;Sit to/from stand                 General ADL Comments: Patient unable to reach BLEs for LB ADLs. Educated, introduced, and demonstrated use of AE to assist with increasing independence with LB ADLs. Educated patient on walk-in shower transfers. Plan  to demonstrate and have patient teach back walk-in shower transfers using RW and BSC during next OT session.     Pertinent Vitals/Pain Pain Assessment: 0-10 Pain Score: 4  Pain Location: left hip Pain Descriptors / Indicators: Sore Pain Intervention(s): Monitored during session     Hand Dominance Right   Extremity/Trunk Assessment Upper Extremity Assessment Upper Extremity Assessment: Overall WFL for tasks assessed       Cervical / Trunk Assessment Cervical / Trunk Assessment: Normal   Communication Communication Communication: No difficulties   Cognition Arousal/Alertness: Awake/alert Behavior During Therapy: WFL for tasks assessed/performed Overall Cognitive Status: Within Functional Limits for tasks assessed             Home Living Family/patient expects to be discharged to:: Private residence Living Arrangements: Spouse/significant other;Children;Other relatives Available Help at Discharge: Family;Available 24 hours/day Type of Home: House Home Access: Stairs to enter [employer] of Steps: 1 Entrance Stairs-Rails: None Home Layout: One level     Bathroom Shower/Tub: Walk-in shower;Door   [employer] Toilet: Standard     Home Equipment: Environmental consultant - 2 wheels;Bedside commode          Prior Functioning/Environment Level of Independence: Independent      OT Diagnosis: Generalized weakness;Acute pain   OT Problem List: Decreased strength;Decreased activity tolerance;Impaired balance (sitting and/or standing);Decreased safety awareness;Decreased knowledge of use of DME or AE;Decreased knowledge of precautions;Pain   OT Treatment/Interventions: Self-care/ADL training;Therapeutic exercise;Energy conservation;DME and/or AE instruction;Therapeutic activities;Patient/family education;Balance training    OT Goals(Current goals can be found in the care plan section) Acute Rehab OT Goals Patient Stated  Goal: to go home tomorrow OT Goal Formulation: With  patient/family Time For Goal Achievement: 06/07/14 Potential to Achieve Goals: Good ADL Goals Pt Will Perform Grooming: with modified independence;standing Pt Will Perform Upper Body Bathing: Independently;sitting Pt Will Perform Lower Body Bathing: with modified independence;sit to/from stand;with adaptive equipment Pt Will Perform Upper Body Dressing: Independently;sitting Pt Will Perform Lower Body Dressing: with modified independence;sit to/from stand;with adaptive equipment Pt Will Transfer to Toilet: with modified independence;bedside commode;ambulating Pt Will Perform Toileting - Clothing Manipulation and hygiene: with modified independence;sit to/from stand Pt Will Perform Tub/Shower Transfer: with modified independence;ambulating;3 in 1;Shower transfer;rolling walker  OT Frequency: Min 2X/week   Barriers to D/C: None known at this time          End of Session Activity Tolerance: Patient tolerated treatment well Patient left: in bed;with call bell/phone within reach;with family/visitor present   Time: 4628-6381 OT Time Calculation (min): 17 min Charges:  OT General Charges $OT Visit: 1 Procedure OT Evaluation $Initial OT Evaluation Tier I: 1 Procedure  Adaira Centola , MS, OTR/L, CLT Pager: [phone]  05/31/2014, 3:47 PM

## 2014-05-31 NOTE — Evaluation (Signed)
Physical Therapy Evaluation Patient Details Name: Karen Baker MRN: [id number] DOB: 1950-10-23 Today's Date: 05/31/2014   History of Present Illness  Pt is a 64 y.o. female s/p Lt direct anterior THA.   Clinical Impression  Pt is s/p Lt direct anterior THA POD#1 resulting in the deficits listed below (see PT Problem List).  Pt will benefit from skilled PT to increase their independence and safety with mobility to allow discharge to the venue listed below. PTA pt very independent. Pt also has good family support.      Follow Up Recommendations Home health PT;Supervision/Assistance - 24 hour (may progress to only needing OPPT  ?)    Equipment Recommendations  None recommended by PT    Recommendations for Other Services OT consult     Precautions / Restrictions Precautions Precautions: None Restrictions Weight Bearing Restrictions: Yes LLE Weight Bearing: Weight bearing as tolerated      Mobility  Bed Mobility Overal bed mobility: Needs Assistance Bed Mobility: Supine to Sit     Supine to sit: Min guard;HOB elevated     General bed mobility comments: cues for technique; pt utilizing handrails  Transfers Overall transfer level: Needs assistance Equipment used: Rolling walker (2 wheeled) Transfers: Sit to/from Stand Sit to Stand: Min guard         General transfer comment: cues for hand placement and safety with RW  Ambulation/Gait Ambulation/Gait assistance: Min guard Ambulation Distance (Feet): 50 Feet Assistive device: Rolling walker (2 wheeled) Gait Pattern/deviations: Decreased stance time - left;Decreased step length - right;Antalgic Gait velocity: decr Gait velocity interpretation: Below normal speed for age/gender General Gait Details: cues for step through gt sequencing and upright posture   Stairs            Wheelchair Mobility    Modified Rankin (Stroke Patients Only)       Balance Overall balance assessment: Needs  assistance Sitting-balance support: Feet supported;No upper extremity supported Sitting balance-Leahy Scale: Good     Standing balance support: During functional activity;Bilateral upper extremity supported Standing balance-Leahy Scale: Poor Standing balance comment: RW to balance                              Pertinent Vitals/Pain Pain Assessment: 0-10 Pain Score: 3  Pain Location: Lt hip Pain Descriptors / Indicators: Sore Pain Intervention(s): Monitored during session;Premedicated before session;Repositioned;Heat applied    Home Living Family/patient expects to be discharged to:: Private residence Living Arrangements: Spouse/significant other;Children;Other relatives Available Help at Discharge: Family;Available 24 hours/day Type of Home: House Home Access: Stairs to enter Entrance Stairs-Rails: None Entrance Stairs-Number of Steps: 1 (threshold) Home Layout: One level Home Equipment: Walker - 2 wheels Additional Comments: pt has walk in shower     Prior Function Level of Independence: Independent               Hand Dominance        Extremity/Trunk Assessment   Upper Extremity Assessment: Defer to OT evaluation           Lower Extremity Assessment: LLE deficits/detail   LLE Deficits / Details: hip 3-/5; quad 3/5  Cervical / Trunk Assessment: Normal  Communication   Communication: No difficulties  Cognition Arousal/Alertness: Awake/alert Behavior During Therapy: WFL for tasks assessed/performed Overall Cognitive Status: Within Functional Limits for tasks assessed                      General Comments  Exercises Total Joint Exercises Ankle Circles/Pumps: AROM;Both;10 reps;Seated Quad Sets: AROM;Left;10 reps Heel Slides: AAROM;Left;10 reps Hip ABduction/ADduction: AAROM;Left;10 reps Long Arc Quad: AROM;Left;10 reps;Seated      Assessment/Plan    PT Assessment Patient needs continued PT services  PT Diagnosis  Difficulty walking;Generalized weakness;Acute pain   PT Problem List Decreased strength;Decreased activity tolerance;Decreased balance;Decreased mobility;Decreased knowledge of use of DME;Pain  PT Treatment Interventions DME instruction;Gait training;Stair training;Functional mobility training;Therapeutic activities;Therapeutic exercise;Balance training;Neuromuscular re-education;Patient/family education   PT Goals (Current goals can be found in the Care Plan section) Acute Rehab PT Goals Patient Stated Goal: to go home tomorrow PT Goal Formulation: With patient Time For Goal Achievement: 06/03/14 Potential to Achieve Goals: Good    Frequency 7X/week   Barriers to discharge        Co-evaluation               End of Session Equipment Utilized During Treatment: Gait belt Activity Tolerance: Patient tolerated treatment well Patient left: in chair;with call bell/phone within reach;with family/visitor present Nurse Communication: Mobility status         Time: 5456-2563 PT Time Calculation (min) (ACUTE ONLY): 26 min   Charges:   PT Evaluation $Initial PT Evaluation Tier I: 1 Procedure PT Treatments $Gait Training: 8-22 mins   PT G CodesGustavus Bryant, [place]  [phone] 05/31/2014, 9:24 AM

## 2014-05-31 NOTE — Plan of Care (Signed)
Problem: Consults Goal: Diagnosis- Total Joint Replacement Primary Total Hip Left     

## 2014-06-01 DIAGNOSIS — M1612 Unilateral primary osteoarthritis, left hip: Secondary | ICD-10-CM | POA: Diagnosis not present

## 2014-06-01 LAB — CBC
HCT: 29.5 % — ABNORMAL LOW (ref 36.0–46.0)
HEMOGLOBIN: 10.3 g/dL — AB (ref 12.0–15.0)
MCH: 30 pg (ref 26.0–34.0)
MCHC: 34.9 g/dL (ref 30.0–36.0)
MCV: 86 fL (ref 78.0–100.0)
Platelets: 189 10*3/uL (ref 150–400)
RBC: 3.43 MIL/uL — AB (ref 3.87–5.11)
RDW: 12.5 % (ref 11.5–15.5)
WBC: 11 10*3/uL — ABNORMAL HIGH (ref 4.0–10.5)

## 2014-06-01 MED ORDER — METHOCARBAMOL 500 MG PO TABS: 500.0000 mg | ORAL_TABLET | Freq: Four times a day (QID) | ORAL | Status: DC | PRN

## 2014-06-01 MED ORDER — ASPIRIN 325 MG PO TBEC: 325.0000 mg | DELAYED_RELEASE_TABLET | Freq: Two times a day (BID) | ORAL | Status: DC

## 2014-06-01 MED ORDER — HYDROCODONE-ACETAMINOPHEN 10-325 MG PO TABS: 1.0000 | ORAL_TABLET | Freq: Four times a day (QID) | ORAL | Status: DC | PRN

## 2014-06-01 NOTE — Progress Notes (Signed)
Physical Therapy Treatment Patient Details Name: Karen Baker MRN: [id number] DOB: [birth year] Today's Date: 06/01/2014    History of Present Illness Pt is a 64 y.o. female s/p Lt direct anterior THA.     PT Comments    Pt very motivated and progressing mobility. Pt safe from mobility standpoint to D/C home today with family.    Follow Up Recommendations  Home health PT;Supervision/Assistance - 24 hour     Equipment Recommendations  None recommended by PT    Recommendations for Other Services       Precautions / Restrictions Precautions Precautions: None Restrictions Weight Bearing Restrictions: Yes LLE Weight Bearing: Weight bearing as tolerated    Mobility  Bed Mobility Overal bed mobility: Modified Independent             General bed mobility comments: bed flattened and handrails removed   Transfers Overall transfer level: Needs assistance Equipment used: Rolling walker (2 wheeled) Transfers: Sit to/from Stand Sit to Stand: Supervision         General transfer comment: min cues for safety with RW  Ambulation/Gait Ambulation/Gait assistance: Supervision Ambulation Distance (Feet): 150 Feet Assistive device: Rolling walker (2 wheeled) Gait Pattern/deviations: Step-through pattern;Decreased stride length Gait velocity: decr Gait velocity interpretation: Below normal speed for age/gender General Gait Details: difficulty initially lifting Rt LE for stepping; no LOB noted; progressing to steady step through gt with incr mobilization    Stairs Stairs: Yes Stairs assistance: Min guard Stair Management: No rails;Step to pattern;Forwards;With walker Number of Stairs: 1 General stair comments: min guard to block RW; cues for technique   Wheelchair Mobility    Modified Rankin (Stroke Patients Only)       Balance Overall balance assessment: Needs assistance Sitting-balance support: Feet supported;No upper extremity supported Sitting balance-Leahy  Scale: Good     Standing balance support: During functional activity;Single extremity supported;No upper extremity supported Standing balance-Leahy Scale: Fair                      Cognition Arousal/Alertness: Awake/alert Behavior During Therapy: WFL for tasks assessed/performed Overall Cognitive Status: Within Functional Limits for tasks assessed                      Exercises Total Joint Exercises Ankle Circles/Pumps: AROM;Both;10 reps;Seated Quad Sets: AROM;10 reps;Both Heel Slides: AAROM;Left;10 reps Straight Leg Raises: AAROM;Right;5 reps Long Arc Quad: AROM;Left;10 reps;Seated    General Comments General comments (skin integrity, edema, etc.): educated on car transfer technique       Pertinent Vitals/Pain Pain Assessment: 0-10 Pain Score: 2  Pain Location: Lt hip Pain Descriptors / Indicators: Sore Pain Intervention(s): Monitored during session;Premedicated before session;Repositioned    Home Living                      Prior Function            PT Goals (current goals can now be found in the care plan section) Acute Rehab PT Goals Patient Stated Goal: to go home today PT Goal Formulation: With patient Time For Goal Achievement: 06/03/14 Potential to Achieve Goals: Good Progress towards PT goals: Progressing toward goals    Frequency  7X/week    PT Plan Current plan remains appropriate    Co-evaluation             End of Session   Activity Tolerance: Patient tolerated treatment well Patient left: in chair;with call bell/phone within reach  Time: 4967-5916 PT Time Calculation (min) (ACUTE ONLY): 24 min  Charges:  $Gait Training: 8-22 mins $Therapeutic Exercise: 8-22 mins                    G Codes:      Gustavus Bryant, [place]  [phone] 06/01/2014, 10:32 AM

## 2014-06-01 NOTE — Discharge Summary (Signed)
Patient ID: Karen Baker MRN: [id number] DOB/AGE: 1950-08-30 64 y.o.  Admit date: 05/30/2014 Discharge date: 06/01/2014  Admission Diagnoses:  Principal Problem:   Primary osteoarthritis of left hip   Discharge Diagnoses:  Same  Past Medical History  Diagnosis Date  . Hypertension     takes Amlodipine daily  . Anxiety     takes Xanax daily  . Constipation     takes Colace daily  . Hyperlipidemia     takes Lovastatin daily  . GERD (gastroesophageal reflux disease)     takes Omeprazole daily  . History of bronchitis 3-49yrs ago  . Arthritis   . Joint pain   . Joint swelling   . Urinary frequency   . Urinary urgency   . History of blood transfusion     no abnormal reaction noted    Surgeries: Procedure(s): LEFT TOTAL HIP ARTHROPLASTY ANTERIOR APPROACH on 05/30/2014   Consultants:    Discharged Condition: Improved  Hospital Course: Karen Baker is an 64 y.o. female who was admitted 05/30/2014 for operative treatment ofPrimary osteoarthritis of left hip. Patient has severe unremitting pain that affects sleep, daily activities, and work/hobbies. After pre-op clearance the patient was taken to the operating room on 05/30/2014 and underwent  Procedure(s): LEFT TOTAL HIP ARTHROPLASTY ANTERIOR APPROACH.    Patient was given perioperative antibiotics: Anti-infectives    Start     Dose/Rate Route Frequency Ordered Stop   05/30/14 1800  ceFAZolin (ANCEF) IVPB 2 g/50 mL premix     2 g 100 mL/hr over 30 Minutes Intravenous Every 6 hours 05/30/14 1536 05/31/14 0047   05/30/14 0600  ceFAZolin (ANCEF) IVPB 2 g/50 mL premix     2 g 100 mL/hr over 30 Minutes Intravenous On call to O.R. 05/29/14 1412 05/30/14 1205       Patient was given sequential compression devices, early ambulation, and chemoprophylaxis to prevent DVT.  Patient benefited maximally from hospital stay and there were no complications.    Recent vital signs: Patient Vitals for the past 24 hrs:  BP Temp Temp src  Pulse Resp SpO2  06/01/14 1200 - - - - 16 96 %  06/01/14 0800 - - - - 16 96 %  06/01/14 0518 (!) 122/55 mmHg 99.3 F (37.4 C) Oral 81 15 95 %  06/01/14 0309 - - - - 14 97 %  06/01/14 0000 - - - - 16 96 %  05/31/14 2127 - (!) 100.4 F (38 C) - - - -  05/31/14 2047 139/64 mmHg (!) 100.8 F (38.2 C) Oral 91 18 94 %  05/31/14 2000 - - - - 16 93 %  05/31/14 1600 - - - - 20 92 %     Recent laboratory studies:  Recent Labs  05/31/14 0633 06/01/14 0554  WBC 10.8* 11.0*  HGB 10.6* 10.3*  HCT 30.3* 29.5*  PLT 203 189  NA 134*  --   K 3.8  --   CL 99  --   CO2 27  --   BUN 11  --   CREATININE 0.65  --   GLUCOSE 131*  --   CALCIUM 8.6  --      Discharge Medications:     Medication List    STOP taking these medications        naproxen 500 MG tablet  Commonly known as:  NAPROSYN      TAKE these medications        ALPRAZolam 0.5 MG tablet  Commonly known as:  XANAX  Take 0.5 mg by mouth 2 (two) times daily.     amLODipine 5 MG tablet  Commonly known as:  NORVASC  Take 5 mg by mouth daily.     aspirin 325 MG EC tablet  Take 1 tablet (325 mg total) by mouth 2 (two) times daily after a meal.     HYDROcodone-acetaminophen 10-325 MG per tablet  Commonly known as:  NORCO  Take 1-2 tablets by mouth every 6 (six) hours as needed for moderate pain or severe pain.     lovastatin 20 MG tablet  Commonly known as:  MEVACOR  Take 20 mg by mouth daily.     methocarbamol 500 MG tablet  Commonly known as:  ROBAXIN  Take 1 tablet (500 mg total) by mouth every 6 (six) hours as needed for muscle spasms.     omeprazole 20 MG capsule  Commonly known as:  PRILOSEC  Take 20 mg by mouth daily.     STOOL SOFTENER PO  Take 2 tablets by mouth 2 (two) times daily.        Diagnostic Studies: Dg Chest 2 View  05/19/2014   CLINICAL DATA:  Hypertension.  EXAM: CHEST  2 VIEW  COMPARISON:  CT 05/30/2013 .  FINDINGS: Mediastinum and hilar structures are normal. Heart size normal. Mild  left left base pleural-parenchymal scarring noted. No pleural effusion or pneumothorax. No acute bony abnormality.  IMPRESSION: 1. Left base pleural-parenchymal scarring. 2. No acute cardiopulmonary disease.   Electronically Signed   By: Marcello Moores  Register   On: 05/19/2014 14:37   Dg Hip Operative Unilat With Pelvis Left  05/30/2014   CLINICAL DATA:  Left total hip arthroplasty.  EXAM: OPERATIVE left HIP (WITH PELVIS IF PERFORMED) 2 VIEWS  TECHNIQUE: Fluoroscopic spot image(s) were submitted for interpretation post-operatively.  COMPARISON:  March 08, 2014.  FINDINGS: Two intraoperative fluoroscopic images of the left hip demonstrate the patient to be status post left total hip arthroplasty. The femoral and acetabular components appear to be well situated.  IMPRESSION: Status post left total hip arthroplasty.   Electronically Signed   By: Marijo Conception, M.D.   On: 05/30/2014 14:09    Disposition: Final discharge disposition not confirmed      Discharge Instructions    Call MD / Call 911    Complete by:  As directed   If you experience chest pain or shortness of breath, CALL 911 and be transported to the hospital emergency room.  If you develope a fever above 101 F, pus (white drainage) or increased drainage or redness at the wound, or calf pain, call your surgeon's office.     Constipation Prevention    Complete by:  As directed   Drink plenty of fluids.  Prune juice may be helpful.  You may use a stool softener, such as Colace (over the counter) 100 mg twice a day.  Use MiraLax (over the counter) for constipation as needed.     Diet - low sodium heart healthy    Complete by:  As directed      Increase activity slowly as tolerated    Complete by:  As directed            Follow-up Information    Follow up with Hessie Dibble, MD. Schedule an appointment as soon as possible for a visit in 2 weeks.   Specialty:  Orthopedic Surgery   Contact information:   [place]  [address] [phone]  Follow up with [facility]   Why:  Someone from [facility] will contact you concening start time for physical therapy.   Contact information:   [address] [place] [address] [phone]        Signed: Rich Fuchs 06/01/2014, 12:45 PM

## 2014-06-01 NOTE — Progress Notes (Signed)
NURSING PROGRESS NOTE  Karen Baker [id number] Discharge Data: 06/01/2014 2:53 PM Attending Provider: Hessie Dibble, MD YIR:SWNIOE,VOJJ, MD     Arvid Right to be D/C'd Home per MD order.  Discussed with the patient the After Visit Summary and all questions fully answered. All IV's discontinued with no bleeding noted. All belongings returned to patient for patient to take home.  Prescriptions given to patient.   Last Vital Signs:  Blood pressure 122/55, pulse 81, temperature 99.3 F (37.4 C), temperature source Oral, resp. rate 16, height 5\' 3"  (1.6 m), weight 87.998 kg (194 lb), SpO2 96 %.  Discharge Medication List   Medication List    STOP taking these medications        naproxen 500 MG tablet  Commonly known as:  NAPROSYN      TAKE these medications        ALPRAZolam 0.5 MG tablet  Commonly known as:  XANAX  Take 0.5 mg by mouth 2 (two) times daily.     amLODipine 5 MG tablet  Commonly known as:  NORVASC  Take 5 mg by mouth daily.     aspirin 325 MG EC tablet  Take 1 tablet (325 mg total) by mouth 2 (two) times daily after a meal.     HYDROcodone-acetaminophen 10-325 MG per tablet  Commonly known as:  NORCO  Take 1-2 tablets by mouth every 6 (six) hours as needed for moderate pain or severe pain.     lovastatin 20 MG tablet  Commonly known as:  MEVACOR  Take 20 mg by mouth daily.     methocarbamol 500 MG tablet  Commonly known as:  ROBAXIN  Take 1 tablet (500 mg total) by mouth every 6 (six) hours as needed for muscle spasms.     omeprazole 20 MG capsule  Commonly known as:  PRILOSEC  Take 20 mg by mouth daily.     STOOL SOFTENER PO  Take 2 tablets by mouth 2 (two) times daily.

## 2014-06-01 NOTE — Progress Notes (Signed)
Occupational Therapy Treatment Patient Details Name: Karen Baker MRN: [id number] DOB: 03-14-1951 Today's Date: 06/01/2014    History of present illness Pt is a 64 y.o. female s/p Lt direct anterior THA.    OT comments  Patient is progressing towards OT goals, continue plan of care for now. Patient states she plans to purchase a hip kit to increase independence with LB ADLs.    Follow Up Recommendations  No OT follow up;Supervision/Assistance - 24 hour    Equipment Recommendations  Other (comment) (AE - reacher, sock aid, LH shoe horn, LH sponge)    Recommendations for Other Services  None at this time    Precautions / Restrictions Precautions Precautions: None Restrictions Weight Bearing Restrictions: Yes LLE Weight Bearing: Weight bearing as tolerated       Mobility Bed Mobility General bed mobility comments: Patient seated in recliner upon OT entering room  Transfers Overall transfer level: Needs assistance Equipment used: Rolling walker (2 wheeled) Transfers: Sit to/from Stand Sit to Stand: Supervision         General transfer comment: cues for hand placement and safety     Balance Overall balance assessment: Needs assistance Sitting-balance support: No upper extremity supported;Feet supported Sitting balance-Leahy Scale: Good     Standing balance support: Bilateral upper extremity supported;During functional activity Standing balance-Leahy Scale: Fair    ADL Toilet Transfer: Supervision/safety;BSC;RW;Ambulation   Writer and Hygiene: Supervision/safety;Sit to/from stand   Tub/ Shower Transfer: Supervision/safety;Ambulation;3 in 1;Walk-in shower;Rolling walker     General ADL Comments: Patient supervision overall for functional mobility and transfers. Discussed use of AE importance of using AE for LB ADLs. Patient performed toilet transfer and simulated walk-in shower transfer using simulated shower block, BSC, and RW. Cues  needed for safe transfer.      Cognition   Behavior During Therapy: WFL for tasks assessed/performed Overall Cognitive Status: Within Functional Limits for tasks assessed                 Pertinent Vitals/ Pain       Pain Assessment: 0-10 Pain Score: 2  Pain Location: left hip Pain Descriptors / Indicators: Sore Pain Intervention(s): Monitored during session;Repositioned         Frequency Min 2X/week     Progress Toward Goals  OT Goals(current goals can now be found in the care plan section)  Progress towards OT goals: Progressing toward goals     Plan Discharge plan remains appropriate       End of Session Equipment Utilized During Treatment: Rolling walker   Activity Tolerance Patient tolerated treatment well   Patient Left in chair;with call bell/phone within reach     Time: 0952-1012 OT Time Calculation (min): 20 min  Charges: OT General Charges $OT Visit: 1 Procedure OT Treatments $Self Care/Home Management : 8-22 mins  Israel Werts , MS, OTR/L, CLT Pager: [phone]  06/01/2014, 10:21 AM

## 2016-11-24 DIAGNOSIS — D369 Benign neoplasm, unspecified site: Secondary | ICD-10-CM | POA: Insufficient documentation

## 2016-11-24 HISTORY — DX: Benign neoplasm, unspecified site: D36.9

## 2019-02-17 ENCOUNTER — Encounter: Payer: Self-pay | Admitting: Cardiology

## 2019-02-17 ENCOUNTER — Encounter: Payer: Self-pay | Admitting: *Deleted

## 2019-02-18 ENCOUNTER — Encounter: Payer: Self-pay | Admitting: Cardiology

## 2019-02-18 ENCOUNTER — Ambulatory Visit (INDEPENDENT_AMBULATORY_CARE_PROVIDER_SITE_OTHER): Payer: Medicare Other | Admitting: Cardiology

## 2019-02-18 ENCOUNTER — Other Ambulatory Visit: Payer: Self-pay

## 2019-02-18 VITALS — BP 112/80 | HR 84 | Ht 63.0 in | Wt 198.0 lb

## 2019-02-18 DIAGNOSIS — Z01812 Encounter for preprocedural laboratory examination: Secondary | ICD-10-CM

## 2019-02-18 DIAGNOSIS — I10 Essential (primary) hypertension: Secondary | ICD-10-CM | POA: Diagnosis not present

## 2019-02-18 DIAGNOSIS — E782 Mixed hyperlipidemia: Secondary | ICD-10-CM

## 2019-02-18 DIAGNOSIS — R072 Precordial pain: Secondary | ICD-10-CM

## 2019-02-18 DIAGNOSIS — R7303 Prediabetes: Secondary | ICD-10-CM

## 2019-02-18 DIAGNOSIS — I4510 Unspecified right bundle-branch block: Secondary | ICD-10-CM

## 2019-02-18 DIAGNOSIS — E669 Obesity, unspecified: Secondary | ICD-10-CM

## 2019-02-18 MED ORDER — NITROGLYCERIN 0.4 MG SL SUBL: 0.4000 mg | SUBLINGUAL_TABLET | SUBLINGUAL | 3 refills | Status: DC | PRN

## 2019-02-18 MED ORDER — METOPROLOL TARTRATE 50 MG PO TABS: ORAL_TABLET | ORAL | 0 refills | Status: DC

## 2019-02-18 NOTE — Patient Instructions (Signed)
Medication Instructions:  Your physician has recommended you make the following change in your medication:   Nitroglycerin 0.4 mg sublingual (under your tongue) as needed for chest pain. If experiencing chest pain, stop what you are doing and sit down. Take 1 nitroglycerin and wait 5 minutes. If chest pain continues, take another nitroglycerin and wait 5 minutes. If chest pain does not subside, take 1 more nitroglycerin and dial 911. You make take a total of 3 nitroglycerin in a 15 minute time frame.   *If you need a refill on your cardiac medications before your next appointment, please call your pharmacy*  Lab Work: Your physician recommends that you return for lab work in:   3-7 days prior to CT appointment: BMP  If you have labs (blood work) drawn today and your tests are completely normal, you will receive your results only by:  [facility] (if you have MyChart) OR  A paper copy in the mail If you have any lab test that is abnormal or we need to change your treatment, we will call you to review the results.  Testing/Procedures: Your physician has requested that you have an echocardiogram. Echocardiography is a painless test that uses sound waves to create images of your heart. It provides your doctor with information about the size and shape of your heart and how well your hearts chambers and valves are working. This procedure takes approximately one hour. There are no restrictions for this procedure.  Your physician has requested that you have cardiac CT. Cardiac computed tomography (CT) is a painless test that uses an x-ray machine to take clear, detailed pictures of your heart. For further information please visit [email or web address]. Please follow instruction sheet as given.  Your cardiac CT will be scheduled at one of the below locations:   [facility] [address] [place], [place] [address] [phone]  If scheduled at [facility], please  arrive at the [facility] main entrance of [facility] 30-45 minutes prior to test start time. Proceed to the [facility] Radiology Department (first floor) to check-in and test prep.  Please follow these instructions carefully (unless otherwise directed):  On the Night Before the Test:  Be sure to Drink plenty of water.  Do not consume any caffeinated/decaffeinated beverages or chocolate 12 hours prior to your test.  Do not take any antihistamines 12 hours prior to your test.  On the Day of the Test:  Drink plenty of water. Do not drink any water within one hour of the test.  Do not eat any food 4 hours prior to the test.  You may take your regular medications prior to the test.   Take metoprolol (Lopressor) two hours prior to test.  FEMALES- please wear underwire-free bra if available                  -If HR is less than 55 BPM- No Beta Blocker(metoprolol)                -IF HR is greater than 55 BPM and patient is less than or equal to 98 yrs old Lopressor(metoprolol) 100mg  x1.                     After the Test:  Drink plenty of water.  After receiving IV contrast, you may experience a mild flushed feeling. This is normal.  On occasion, you may experience a mild rash up to 24 hours after the test. This  is not dangerous. If this occurs, you can take Benadryl 25 mg and increase your fluid intake.  If you experience trouble breathing, this can be serious. If it is severe call 911 IMMEDIATELY. If it is mild, please call our office.  Once we have confirmed authorization from your insurance company, we will call you to set up a date and time for your test.   For non-scheduling related questions, please contact the cardiac imaging nurse navigator should you have any questions/concerns: Marchia Bond, RN Navigator Cardiac Imaging [facility] [phone] Office    Follow-Up: At [facility], you and your health needs are our priority.   As part of our continuing mission to provide you with exceptional heart care, we have created designated Provider Care Teams.  These Care Teams include your primary Cardiologist (physician) and Advanced Practice Providers (APPs -  Physician Assistants and Nurse Practitioners) who all work together to provide you with the care you need, when you need it.  Your next appointment:   3 month(s)  The format for your next appointment:   In Person  Provider:   Berniece Salines, DO  Other Instructions  Cardiac CT Angiogram  A cardiac CT angiogram is a procedure to look at the heart and the area around the heart. It may be done to help find the cause of chest pains or other symptoms of heart disease. During this procedure, a large X-ray machine, called a CT scanner, takes detailed pictures of the heart and the surrounding area after a dye (contrast material) has been injected into blood vessels in the area. The procedure is also sometimes called a coronary CT angiogram, coronary artery scanning, or CTA. A cardiac CT angiogram allows the health care provider to see how well blood is flowing to and from the heart. The health care provider will be able to see if there are any problems, such as:  Blockage or narrowing of the coronary arteries in the heart.  Fluid around the heart.  Signs of weakness or disease in the muscles, valves, and tissues of the heart. Tell a health care provider about:  Any allergies you have. This is especially important if you have had a previous allergic reaction to contrast dye.  All medicines you are taking, including vitamins, herbs, eye drops, creams, and over-the-counter medicines.  Any blood disorders you have.  Any surgeries you have had.  Any medical conditions you have.  Whether you are pregnant or may be pregnant.  Any anxiety disorders, chronic pain, or other conditions you have that may increase your stress or prevent you from lying still. What are the  risks? Generally, this is a safe procedure. However, problems may occur, including:  Bleeding.  Infection.  Allergic reactions to medicines or dyes.  Damage to other structures or organs.  Kidney damage from the dye or contrast that is used.  Increased risk of cancer from radiation exposure. This risk is low. Talk with your health care provider about: ? The risks and benefits of testing. ? How you can receive the lowest dose of radiation. What happens before the procedure?  Wear comfortable clothing and remove any jewelry, glasses, dentures, and hearing aids.  Follow instructions from your health care provider about eating and drinking. This may include: ? For 12 hours before the test -- avoid caffeine. This includes tea, coffee, soda, energy drinks, and diet pills. Drink plenty of water or other fluids that do not have caffeine in them. Being well-hydrated  can prevent complications. ? For 4-6 hours before the test -- stop eating and drinking. The contrast dye can cause nausea, but this is less likely if your stomach is empty.  Ask your health care provider about changing or stopping your regular medicines. This is especially important if you are taking diabetes medicines, blood thinners, or medicines to treat erectile dysfunction. What happens during the procedure?  Hair on your chest may need to be removed so that small sticky patches called electrodes can be placed on your chest. These will transmit information that helps to monitor your heart during the test.  An IV tube will be inserted into one of your veins.  You might be given a medicine to control your heart rate during the test. This will help to ensure that good images are obtained.  You will be asked to lie on an exam table. This table will slide in and out of the CT machine during the procedure.  Contrast dye will be injected into the IV tube. You might feel warm, or you may get a metallic taste in your mouth.  You  will be given a medicine (nitroglycerin) to relax (dilate) the arteries in your heart.  The table that you are lying on will move into the CT machine tunnel for the scan.  The person running the machine will give you instructions while the scans are being done. You may be asked to: ? Keep your arms above your head. ? Hold your breath. ? Stay very still, even if the table is moving.  When the scanning is complete, you will be moved out of the machine.  The IV tube will be removed. The procedure may vary among health care providers and hospitals. What happens after the procedure?  You might feel warm, or you may get a metallic taste in your mouth from the contrast dye.  You may have a headache from the nitroglycerin.  After the procedure, drink water or other fluids to wash (flush) the contrast material out of your body.  Contact a health care provider if you have any symptoms of allergy to the contrast. These symptoms include: ? Shortness of breath. ? Rash or hives. ? A racing heartbeat.  Most people can return to their normal activities right after the procedure. Ask your health care provider what activities are safe for you.  It is up to you to get the results of your procedure. Ask your health care provider, or the department that is doing the procedure, when your results will be ready. Summary  A cardiac CT angiogram is a procedure to look at the heart and the area around the heart. It may be done to help find the cause of chest pains or other symptoms of heart disease.  During this procedure, a large X-ray machine, called a CT scanner, takes detailed pictures of the heart and the surrounding area after a dye (contrast material) has been injected into blood vessels in the area.  Ask your health care provider about changing or stopping your regular medicines before the procedure. This is especially important if you are taking diabetes medicines, blood thinners, or medicines to  treat erectile dysfunction.  After the procedure, drink water or other fluids to wash (flush) the contrast material out of your body. This information is not intended to replace advice given to you by your health care provider. Make sure you discuss any questions you have with your health care provider. Document Released: 02/28/2008 Document Revised: 02/27/2017 Document  Reviewed: 02/04/2016 Elsevier Patient Education  [employer].  Echocardiogram An echocardiogram is a procedure that uses painless sound waves (ultrasound) to produce an image of the heart. Images from an echocardiogram can provide important information about:  Signs of coronary artery disease (CAD).  Aneurysm detection. An aneurysm is a weak or damaged part of an artery wall that bulges out from the normal force of blood pumping through the body.  Heart size and shape. Changes in the size or shape of the heart can be associated with certain conditions, including heart failure, aneurysm, and CAD.  Heart muscle function.  Heart valve function.  Signs of a past heart attack.  Fluid buildup around the heart.  Thickening of the heart muscle.  A tumor or infectious growth around the heart valves. Tell a health care provider about:  Any allergies you have.  All medicines you are taking, including vitamins, herbs, eye drops, creams, and over-the-counter medicines.  Any blood disorders you have.  Any surgeries you have had.  Any medical conditions you have.  Whether you are pregnant or may be pregnant. What are the risks? Generally, this is a safe procedure. However, problems may occur, including:  Allergic reaction to dye (contrast) that may be used during the procedure. What happens before the procedure? No specific preparation is needed. You may eat and drink normally. What happens during the procedure?   An IV tube may be inserted into one of your veins.  You may receive contrast through this tube.  A contrast is an injection that improves the quality of the pictures from your heart.  A gel will be applied to your chest.  A wand-like tool (transducer) will be moved over your chest. The gel will help to transmit the sound waves from the transducer.  The sound waves will harmlessly bounce off of your heart to allow the heart images to be captured in real-time motion. The images will be recorded on a computer. The procedure may vary among health care providers and hospitals. What happens after the procedure?  You may return to your normal, everyday life, including diet, activities, and medicines, unless your health care provider tells you not to do that. Summary  An echocardiogram is a procedure that uses painless sound waves (ultrasound) to produce an image of the heart.  Images from an echocardiogram can provide important information about the size and shape of your heart, heart muscle function, heart valve function, and fluid buildup around your heart.  You do not need to do anything to prepare before this procedure. You may eat and drink normally.  After the echocardiogram is completed, you may return to your normal, everyday life, unless your health care provider tells you not to do that. This information is not intended to replace advice given to you by your health care provider. Make sure you discuss any questions you have with your health care provider. Document Released: 03/14/2000 Document Revised: 07/08/2018 Document Reviewed: 04/19/2016 Elsevier Patient Education  2020 [employer].

## 2019-02-18 NOTE — Progress Notes (Signed)
Cardiology Office Note:    Date:  02/18/2019   ID:  Karen Baker, DOB 09-13-1950, MRN [id number]  PCP:  Antonietta Jewel, MD  Cardiologist:  Berniece Salines, DO  Electrophysiologist:  None   Referring MD: Earlyne Iba, NP   Chief Complaint  Patient presents with   Abnormal ECG   History of Present Illness:    Karen Baker is a 68 y.o. female with a hx of hypertension, hyperlipidemia and prediabetes (hemoglobin A1c January 10, 2019 reported as 6.2%) presents to be evaluated chest pain.  Patient tells me that in the last several months she has been experiencing diffuse chest pressure.  She described it as a heaviness happening mostly at night which often last for 2 to 3 minutes prior to resolution.  She denies any radiation to this.  She admits to sometimes experiencing shortness of breath she notes that in the beginning it was sparingly but now this is happening quite often.  She did speak to her PCP about this who recommended she see cardiology for further evaluation.    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   Primary osteoarthritis of left hip 05/30/2014   Urinary frequency    Urinary urgency     Past Surgical History:  Procedure Laterality Date   ABDOMINAL HYSTERECTOMY  1972   APPENDECTOMY  1969   COLONOSCOPY     cyst removed from ovary     knee athroscopy Right    knots removed from neck     [place] Left 05/30/2014   Procedure: LEFT TOTAL HIP ARTHROPLASTY ANTERIOR APPROACH;  Surgeon: Hessie Dibble, MD;  Location: [facility];   Service: Orthopedics;  Laterality: Left;   tumors removed from arches in feet Bilateral     Current Medications: Current Meds  Medication Sig   amLODipine (NORVASC) 5 MG tablet Take 5 mg by mouth daily.   furosemide (LASIX) 20 MG tablet Take 40 mg by mouth 2 (two) times daily. Takes 40 mg in am and 20 mg after lunch   lisinopril (ZESTRIL) 5 MG tablet Take 5 mg by mouth daily.   naproxen (NAPROSYN) 500 MG tablet Take 500 mg by mouth 2 (two) times daily with a meal.   omeprazole (PRILOSEC) 20 MG capsule Take 20 mg by mouth daily.   rosuvastatin (CRESTOR) 5 MG tablet Take 5 mg by mouth 3 (three) times a week.     Allergies:   Azithromycin and Aspirin   Social History   Socioeconomic History   Marital status: Married    Spouse name: Not on file   Number of children: Not on file   Years of education: Not on file   Highest education level: Not on file  Occupational History   Not on file  Social Needs   Financial resource strain: Not on file   Food insecurity    Worry: Not on file    Inability: Not on file   Transportation needs  Medical: Not on file    Non-medical: Not on file  Tobacco Use   Smoking status: Former Smoker    Packs/day: 1.00    Years: 20.00    Pack years: 20.00    Types: Cigarettes    Quit date: 02/17/2004    Years since quitting: 15.0   Smokeless tobacco: Never Used   Tobacco comment: no cigs in over a week  Substance and Sexual Activity   Alcohol use: No   Drug use: No   Sexual activity: Yes    Birth control/protection: Surgical  Lifestyle   Physical activity    Days per week: Not on file    Minutes per session: Not on file   Stress: Not on file  Relationships   Social connections    Talks on phone: Not on file    Gets together: Not on file    Attends religious service: Not on file    Active member of club or organization: Not on file    Attends meetings of clubs or organizations: Not on file    Relationship status:  Not on file  Other Topics Concern   Not on file  Social History Narrative   Not on file     Family History: The patient's family history includes Cancer in her sister; Hypertension in her brother and mother; Stroke in her mother.  ROS:   Review of Systems  Constitution: Negative for decreased appetite, fever and weight gain.  HENT: Negative for congestion, ear discharge, hoarse voice and sore throat.   Eyes: Negative for discharge, redness, vision loss in right eye and visual halos.  Cardiovascular: Reports chest pain.  Negative for dyspnea on exertion, leg swelling, orthopnea and palpitations.  Respiratory: Negative for cough, hemoptysis, shortness of breath and snoring.   Endocrine: Negative for heat intolerance and polyphagia.  Hematologic/Lymphatic: Negative for bleeding problem. Does not bruise/bleed easily.  Skin: Negative for flushing, nail changes, rash and suspicious lesions.  Musculoskeletal: Negative for arthritis, joint pain, muscle cramps, myalgias, neck pain and stiffness.  Gastrointestinal: Negative for abdominal pain, bowel incontinence, diarrhea and excessive appetite.  Genitourinary: Negative for decreased libido, genital sores and incomplete emptying.  Neurological: Negative for brief paralysis, focal weakness, headaches and loss of balance.  Psychiatric/Behavioral: Negative for altered mental status, depression and suicidal ideas.  Allergic/Immunologic: Negative for HIV exposure and persistent infections.    EKGs/Labs/Other Studies Reviewed:    The following studies were reviewed today:  EKG:  The ekg ordered today demonstrates sinus rhythm, heart rate 79 bpm, Right bundle branch block with left anterior fascicular block, LVH by aVL criteria. Compared to EKG performed at her PCP office at which time she evidence of sinus rhythm with right bundle branch block and left anterior fascicular block  Recent Labs: Labs done January 10, 2019 BUN 21, creatinine 1.15,  TSH 1.45, glucose 1 4, hemoglobin A1c 6.2% Recent Lipid Panel Lipid profile performed on January 10, 2019: HDL 47, total cholesterol 207, triglyceride 119 LDL not available  Physical Exam:    VS:  BP 112/80 (BP Location: Left Arm, Patient Position: Sitting, Cuff Size: Normal)    Pulse 84    Ht 5\' 3"  (1.6 m)    Wt 198 lb (89.8 kg)    SpO2 95%    BMI 35.07 kg/m     Wt Readings from Last 3 Encounters:  02/18/19 198 lb (89.8 kg)  02/10/19 199 lb (90.3 kg)  05/30/14 194 lb (88 kg)     GEN: Obese  female, well nourished, well developed in no acute distress HEENT: Normal NECK: No JVD; No carotid bruits LYMPHATICS: No lymphadenopathy CARDIAC: S1S2 noted,RRR, no murmurs, rubs, gallops RESPIRATORY:  Clear to auscultation without rales, wheezing or rhonchi  ABDOMEN: Soft, non-tender, non-distended, +bowel sounds, no guarding. EXTREMITIES: No edema, No cyanosis, no clubbing MUSCULOSKELETAL:  No edema; No deformity  SKIN: Warm and dry NEUROLOGIC:  Alert and oriented x 3, non-focal PSYCHIATRIC:  Normal affect, good insight  ASSESSMENT:    1. Precordial pain   2. Essential hypertension   3. Pre-procedure lab exam   4. Mixed hyperlipidemia   5. Obesity (BMI 30-39.9)   6. Prediabetes   7. Right bundle branch block    PLAN:    1.  Chest pain-given her risk factors and concern for coronary disease in this patient.  Therefore I am going to proceed with ischemic evaluation.  At this time a CTA coronary would be appropriate.  The patient denies any IV contrast dye allergies.  She is agreeable to proceed with this testing.  In addition, sublingual nitroglycerin prescription was sent, its protocol and 911 protocol explained and the patient vocalized understanding questions were answered to the patient's satisfaction.  2.  Hyperlipidemia she was recently switched from lovastatin to Crestor 5 mg daily.  Repeat her lipid profile in 3 months and adjust medication as appropriate.  3.  Hypertension  blood pressure appears to be controlled on lisinopril 5 mg daily and amlodipine 5 mg daily.  She also takes Lasix 40 mg in the morning and 20 mg after lunch.  In addition transthoracic echocardiogram will be ordered as has any abnormalities, valvular abnormalities and RV/LV function.  4.  Obesity-the patient understands the need to lose weight with diet and exercise. We have discussed specific strategies for this.    5.  Prediabetes patient was educated on her diagnosis and encouraged to continue with aggressive lifestyle modification.  The patient is in agreement with the above plan. The patient left the office in stable condition.  The patient will follow up in 3 months or sooner if needed.  Medication Adjustments/Labs and Tests Ordered: Current medicines are reviewed at length with the patient today.  Concerns regarding medicines are outlined above.  Orders Placed This Encounter  Procedures   CT CORONARY FRACTIONAL FLOW RESERVE DATA PREP   CT CORONARY FRACTIONAL FLOW RESERVE FLUID ANALYSIS   CT CORONARY MORPH W/CTA COR W/SCORE W/CA W/CM &/OR WO/CM   Basic Metabolic Panel (BMET)   EKG 12-Lead   ECHOCARDIOGRAM COMPLETE   Meds ordered this encounter  Medications   metoprolol tartrate (LOPRESSOR) 50 MG tablet    Sig: Take 2 tabs (100 mg) once 2 hours prior to CT appointment if heart rate greater than 55    Dispense:  2 tablet    Refill:  0   nitroGLYCERIN (NITROSTAT) 0.4 MG SL tablet    Sig: Place 1 tablet (0.4 mg total) under the tongue every 5 (five) minutes as needed.    Dispense:  30 tablet    Refill:  3    Patient Instructions  Medication Instructions:  Your physician has recommended you make the following change in your medication:   Nitroglycerin 0.4 mg sublingual (under your tongue) as needed for chest pain. If experiencing chest pain, stop what you are doing and sit down. Take 1 nitroglycerin and wait 5 minutes. If chest pain continues, take another nitroglycerin  and wait 5 minutes. If chest pain does not subside, take 1 more nitroglycerin  and dial 911. You make take a total of 3 nitroglycerin in a 15 minute time frame.   *If you need a refill on your cardiac medications before your next appointment, please call your pharmacy*  Lab Work: Your physician recommends that you return for lab work in:   3-7 days prior to CT appointment: BMP  If you have labs (blood work) drawn today and your tests are completely normal, you will receive your results only by:  [facility] (if you have MyChart) OR  A paper copy in the mail If you have any lab test that is abnormal or we need to change your treatment, we will call you to review the results.  Testing/Procedures: Your physician has requested that you have an echocardiogram. Echocardiography is a painless test that uses sound waves to create images of your heart. It provides your doctor with information about the size and shape of your heart and how well your hearts chambers and valves are working. This procedure takes approximately one hour. There are no restrictions for this procedure.  Your physician has requested that you have cardiac CT. Cardiac computed tomography (CT) is a painless test that uses an x-ray machine to take clear, detailed pictures of your heart. For further information please visit [email or web address]. Please follow instruction sheet as given.  Your cardiac CT will be scheduled at one of the below locations:   [facility] [address] [place], [place] [address] [phone]  If scheduled at [facility], please arrive at the [facility] main entrance of [facility] 30-45 minutes prior to test start time. Proceed to the [facility] Radiology Department (first floor) to check-in and test prep.  Please follow these instructions carefully (unless otherwise directed):  On the Night Before the Test:  Be sure to Drink plenty of water.  Do not consume  any caffeinated/decaffeinated beverages or chocolate 12 hours prior to your test.  Do not take any antihistamines 12 hours prior to your test.  On the Day of the Test:  Drink plenty of water. Do not drink any water within one hour of the test.  Do not eat any food 4 hours prior to the test.  You may take your regular medications prior to the test.   Take metoprolol (Lopressor) two hours prior to test.  FEMALES- please wear underwire-free bra if available                  -If HR is less than 55 BPM- No Beta Blocker(metoprolol)                -IF HR is greater than 55 BPM and patient is less than or equal to 37 yrs old Lopressor(metoprolol) 100mg  x1.                     After the Test:  Drink plenty of water.  After receiving IV contrast, you may experience a mild flushed feeling. This is normal.  On occasion, you may experience a mild rash up to 24 hours after the test. This is not dangerous. If this occurs, you can take Benadryl 25 mg and increase your fluid intake.  If you experience trouble breathing, this can be serious. If it is severe call 911 IMMEDIATELY. If it is mild, please call our office.  Once we have confirmed authorization from your insurance company, we will call you to set up a date and time for your test.   For  non-scheduling related questions, please contact the cardiac imaging nurse navigator should you have any questions/concerns: Marchia Bond, RN Navigator Cardiac Imaging [facility] [phone] Office    Follow-Up: At [facility], you and your health needs are our priority.  As part of our continuing mission to provide you with exceptional heart care, we have created designated Provider Care Teams.  These Care Teams include your primary Cardiologist (physician) and Advanced Practice Providers (APPs -  Physician Assistants and Nurse Practitioners) who all work together to provide you with the care you need, when you need  it.  Your next appointment:   3 month(s)  The format for your next appointment:   In Person  Provider:   Berniece Salines, DO  Other Instructions  Cardiac CT Angiogram  A cardiac CT angiogram is a procedure to look at the heart and the area around the heart. It may be done to help find the cause of chest pains or other symptoms of heart disease. During this procedure, a large X-ray machine, called a CT scanner, takes detailed pictures of the heart and the surrounding area after a dye (contrast material) has been injected into blood vessels in the area. The procedure is also sometimes called a coronary CT angiogram, coronary artery scanning, or CTA. A cardiac CT angiogram allows the health care provider to see how well blood is flowing to and from the heart. The health care provider will be able to see if there are any problems, such as:  Blockage or narrowing of the coronary arteries in the heart.  Fluid around the heart.  Signs of weakness or disease in the muscles, valves, and tissues of the heart. Tell a health care provider about:  Any allergies you have. This is especially important if you have had a previous allergic reaction to contrast dye.  All medicines you are taking, including vitamins, herbs, eye drops, creams, and over-the-counter medicines.  Any blood disorders you have.  Any surgeries you have had.  Any medical conditions you have.  Whether you are pregnant or may be pregnant.  Any anxiety disorders, chronic pain, or other conditions you have that may increase your stress or prevent you from lying still. What are the risks? Generally, this is a safe procedure. However, problems may occur, including:  Bleeding.  Infection.  Allergic reactions to medicines or dyes.  Damage to other structures or organs.  Kidney damage from the dye or contrast that is used.  Increased risk of cancer from radiation exposure. This risk is low. Talk with your health care  provider about: ? The risks and benefits of testing. ? How you can receive the lowest dose of radiation. What happens before the procedure?  Wear comfortable clothing and remove any jewelry, glasses, dentures, and hearing aids.  Follow instructions from your health care provider about eating and drinking. This may include: ? For 12 hours before the test -- avoid caffeine. This includes tea, coffee, soda, energy drinks, and diet pills. Drink plenty of water or other fluids that do not have caffeine in them. Being well-hydrated can prevent complications. ? For 4-6 hours before the test -- stop eating and drinking. The contrast dye can cause nausea, but this is less likely if your stomach is empty.  Ask your health care provider about changing or stopping your regular medicines. This is especially important if you are taking diabetes medicines, blood thinners, or medicines to treat erectile dysfunction. What happens during the procedure?  Hair  on your chest may need to be removed so that small sticky patches called electrodes can be placed on your chest. These will transmit information that helps to monitor your heart during the test.  An IV tube will be inserted into one of your veins.  You might be given a medicine to control your heart rate during the test. This will help to ensure that good images are obtained.  You will be asked to lie on an exam table. This table will slide in and out of the CT machine during the procedure.  Contrast dye will be injected into the IV tube. You might feel warm, or you may get a metallic taste in your mouth.  You will be given a medicine (nitroglycerin) to relax (dilate) the arteries in your heart.  The table that you are lying on will move into the CT machine tunnel for the scan.  The person running the machine will give you instructions while the scans are being done. You may be asked to: ? Keep your arms above your head. ? Hold your breath. ? Stay  very still, even if the table is moving.  When the scanning is complete, you will be moved out of the machine.  The IV tube will be removed. The procedure may vary among health care providers and hospitals. What happens after the procedure?  You might feel warm, or you may get a metallic taste in your mouth from the contrast dye.  You may have a headache from the nitroglycerin.  After the procedure, drink water or other fluids to wash (flush) the contrast material out of your body.  Contact a health care provider if you have any symptoms of allergy to the contrast. These symptoms include: ? Shortness of breath. ? Rash or hives. ? A racing heartbeat.  Most people can return to their normal activities right after the procedure. Ask your health care provider what activities are safe for you.  It is up to you to get the results of your procedure. Ask your health care provider, or the department that is doing the procedure, when your results will be ready. Summary  A cardiac CT angiogram is a procedure to look at the heart and the area around the heart. It may be done to help find the cause of chest pains or other symptoms of heart disease.  During this procedure, a large X-ray machine, called a CT scanner, takes detailed pictures of the heart and the surrounding area after a dye (contrast material) has been injected into blood vessels in the area.  Ask your health care provider about changing or stopping your regular medicines before the procedure. This is especially important if you are taking diabetes medicines, blood thinners, or medicines to treat erectile dysfunction.  After the procedure, drink water or other fluids to wash (flush) the contrast material out of your body. This information is not intended to replace advice given to you by your health care provider. Make sure you discuss any questions you have with your health care provider. Document Released: 02/28/2008 Document  Revised: 02/27/2017 Document Reviewed: 02/04/2016 Elsevier Patient Education  [place].  Echocardiogram An echocardiogram is a procedure that uses painless sound waves (ultrasound) to produce an image of the heart. Images from an echocardiogram can provide important information about:  Signs of coronary artery disease (CAD).  Aneurysm detection. An aneurysm is a weak or damaged part of an artery wall that bulges out from the normal force of blood  pumping through the body.  Heart size and shape. Changes in the size or shape of the heart can be associated with certain conditions, including heart failure, aneurysm, and CAD.  Heart muscle function.  Heart valve function.  Signs of a past heart attack.  Fluid buildup around the heart.  Thickening of the heart muscle.  A tumor or infectious growth around the heart valves. Tell a health care provider about:  Any allergies you have.  All medicines you are taking, including vitamins, herbs, eye drops, creams, and over-the-counter medicines.  Any blood disorders you have.  Any surgeries you have had.  Any medical conditions you have.  Whether you are pregnant or may be pregnant. What are the risks? Generally, this is a safe procedure. However, problems may occur, including:  Allergic reaction to dye (contrast) that may be used during the procedure. What happens before the procedure? No specific preparation is needed. You may eat and drink normally. What happens during the procedure?   An IV tube may be inserted into one of your veins.  You may receive contrast through this tube. A contrast is an injection that improves the quality of the pictures from your heart.  A gel will be applied to your chest.  A wand-like tool (transducer) will be moved over your chest. The gel will help to transmit the sound waves from the transducer.  The sound waves will harmlessly bounce off of your heart to allow the heart images to  be captured in real-time motion. The images will be recorded on a computer. The procedure may vary among health care providers and hospitals. What happens after the procedure?  You may return to your normal, everyday life, including diet, activities, and medicines, unless your health care provider tells you not to do that. Summary  An echocardiogram is a procedure that uses painless sound waves (ultrasound) to produce an image of the heart.  Images from an echocardiogram can provide important information about the size and shape of your heart, heart muscle function, heart valve function, and fluid buildup around your heart.  You do not need to do anything to prepare before this procedure. You may eat and drink normally.  After the echocardiogram is completed, you may return to your normal, everyday life, unless your health care provider tells you not to do that. This information is not intended to replace advice given to you by your health care provider. Make sure you discuss any questions you have with your health care provider. Document Released: 03/14/2000 Document Revised: 07/08/2018 Document Reviewed: 04/19/2016 Elsevier Patient Education  2020 [employer].      Adopting a Healthy Lifestyle.  Know what a healthy weight is for you (roughly BMI <25) and aim to maintain this   Aim for 7+ servings of fruits and vegetables daily   65-80+ fluid ounces of water or unsweet tea for healthy kidneys   Limit to max 1 drink of alcohol per day; avoid smoking/tobacco   Limit animal fats in diet for cholesterol and heart health - choose grass fed whenever available   Avoid highly processed foods, and foods high in saturated/trans fats   Aim for low stress - take time to unwind and care for your mental health   Aim for 150 min of moderate intensity exercise weekly for heart health, and weights twice weekly for bone health   Aim for 7-9 hours of sleep daily   When it comes to diets,  agreement about the perfect plan isnt  easy to find, even among the experts. Experts at the [employer] developed an idea known as the Healthy Eating Plate. Just imagine a plate divided into logical, healthy portions.   The emphasis is on diet quality:   Load up on vegetables and fruits - one-half of your plate: Aim for color and variety, and remember that potatoes dont count.   Go for whole grains - one-quarter of your plate: Whole wheat, barley, wheat berries, quinoa, oats, brown rice, and foods made with them. If you want pasta, go with whole wheat pasta.   Protein power - one-quarter of your plate: Fish, chicken, beans, and nuts are all healthy, versatile protein sources. Limit red meat.   The diet, however, does go beyond the plate, offering a few other suggestions.   Use healthy plant oils, such as olive, canola, soy, corn, sunflower and peanut. Check the labels, and avoid partially hydrogenated oil, which have unhealthy trans fats.   If youre thirsty, drink water. Coffee and tea are good in moderation, but skip sugary drinks and limit milk and dairy products to one or two daily servings.   The type of carbohydrate in the diet is more important than the amount. Some sources of carbohydrates, such as vegetables, fruits, whole grains, and beans-are healthier than others.   Finally, stay active  Signed, Berniece Salines, DO  02/18/2019 10:27 AM    [place]

## 2019-03-28 ENCOUNTER — Ambulatory Visit (HOSPITAL_COMMUNITY): Payer: Medicare Other

## 2019-04-08 ENCOUNTER — Telehealth (HOSPITAL_COMMUNITY): Payer: Self-pay

## 2019-04-08 NOTE — Telephone Encounter (Signed)

## 2019-04-11 ENCOUNTER — Ambulatory Visit (HOSPITAL_COMMUNITY): Payer: Medicare PPO

## 2019-04-15 ENCOUNTER — Ambulatory Visit (INDEPENDENT_AMBULATORY_CARE_PROVIDER_SITE_OTHER): Payer: Medicare PPO

## 2019-04-15 ENCOUNTER — Other Ambulatory Visit: Payer: Self-pay

## 2019-04-15 DIAGNOSIS — R072 Precordial pain: Secondary | ICD-10-CM

## 2019-04-15 DIAGNOSIS — I10 Essential (primary) hypertension: Secondary | ICD-10-CM | POA: Diagnosis not present

## 2019-04-15 NOTE — Progress Notes (Signed)
Complete echocardiogram has been performed.  Jimmy Izabel Chim RDCS, RVT 

## 2019-04-25 ENCOUNTER — Telehealth (HOSPITAL_COMMUNITY): Payer: Self-pay | Admitting: Emergency Medicine

## 2019-04-25 ENCOUNTER — Telehealth: Payer: Self-pay | Admitting: Cardiology

## 2019-04-25 ENCOUNTER — Encounter (HOSPITAL_COMMUNITY): Payer: Self-pay

## 2019-04-25 LAB — BASIC METABOLIC PANEL
BUN/Creatinine Ratio: 16 (ref 12–28)
BUN: 19 mg/dL (ref 8–27)
CO2: 28 mmol/L (ref 20–29)
Calcium: 11.3 mg/dL — ABNORMAL HIGH (ref 8.7–10.3)
Chloride: 98 mmol/L (ref 96–106)
Creatinine, Ser: 1.17 mg/dL — ABNORMAL HIGH (ref 0.57–1.00)
GFR calc Af Amer: 55 mL/min/{1.73_m2} — ABNORMAL LOW (ref >59)
GFR calc non Af Amer: 47 mL/min/{1.73_m2} — ABNORMAL LOW (ref >59)
Glucose: 106 mg/dL — ABNORMAL HIGH (ref 65–99)
Potassium: 4.5 mmol/L (ref 3.5–5.2)
Sodium: 136 mmol/L (ref 134–144)

## 2019-04-25 NOTE — Telephone Encounter (Signed)
Left message on voicemail with name and callback number Binta Statzer RN Navigator Cardiac Imaging [facility] [phone] Office [phone] Cell  

## 2019-04-25 NOTE — Telephone Encounter (Signed)
BMP from today is in [place], will send to Dr. Harriet Masson to review and advise.

## 2019-04-25 NOTE — Telephone Encounter (Signed)
Patient lab review showed elevated creatinine at 1.17, compared to that 4 years ago creatinine was 0.65.  Please asked patient to increase hydration today and after her CTA tomorrow.

## 2019-04-25 NOTE — Telephone Encounter (Signed)
Karen Baker calling from [facility] lab stating she faxed over a BMP today. She would like to know if it was received.

## 2019-04-26 ENCOUNTER — Ambulatory Visit (HOSPITAL_COMMUNITY)
Admission: RE | Admit: 2019-04-26 | Discharge: 2019-04-26 | Disposition: A | Discharge status: Home / Self Care | Payer: Medicare PPO | Source: Ambulatory Visit | Attending: Cardiology | Admitting: Cardiology

## 2019-04-26 ENCOUNTER — Other Ambulatory Visit: Payer: Self-pay

## 2019-04-26 DIAGNOSIS — I2510 Atherosclerotic heart disease of native coronary artery without angina pectoris: Secondary | ICD-10-CM

## 2019-04-26 DIAGNOSIS — R072 Precordial pain: Secondary | ICD-10-CM | POA: Insufficient documentation

## 2019-04-26 MED ORDER — NITROGLYCERIN 0.4 MG SL SUBL
0.8000 mg | SUBLINGUAL_TABLET | Freq: Once | SUBLINGUAL | Status: AC
  Administered 2019-04-26: 16:00:00 0.8 mg via SUBLINGUAL

## 2019-04-26 MED ORDER — IOHEXOL 350 MG/ML SOLN
80.0000 mL | Freq: Once | INTRAVENOUS | Status: AC | PRN
  Administered 2019-04-26: 80 mL via INTRAVENOUS

## 2019-04-26 MED ORDER — NITROGLYCERIN 0.4 MG SL SUBL
SUBLINGUAL_TABLET | SUBLINGUAL | Status: AC
  Filled 2019-04-26: qty 2

## 2019-04-26 NOTE — Telephone Encounter (Signed)
Telephone call to patient. Informed of lab results and need to hydrate before and after Cta today.Pt verbalized understanding.

## 2019-04-27 DIAGNOSIS — I2510 Atherosclerotic heart disease of native coronary artery without angina pectoris: Secondary | ICD-10-CM | POA: Diagnosis not present

## 2019-04-28 ENCOUNTER — Telehealth: Payer: Self-pay | Admitting: *Deleted

## 2019-04-28 NOTE — Telephone Encounter (Signed)
Patient called back. Appointment made for 04/29/19 at 2:55 PM to discuss  Cardiac CTa results

## 2019-04-28 NOTE — Telephone Encounter (Signed)
Left message to return call regarding cardiac Cta results.

## 2019-04-28 NOTE — Telephone Encounter (Signed)
-----   Message from Berniece Salines, DO sent at 04/28/2019  9:25 AM EST ----- Please let the patient know that I would like to see her sooner than February 22 if possible to discuss her CT reports.

## 2019-04-29 ENCOUNTER — Other Ambulatory Visit: Payer: Self-pay

## 2019-04-29 ENCOUNTER — Ambulatory Visit (INDEPENDENT_AMBULATORY_CARE_PROVIDER_SITE_OTHER): Payer: Medicare PPO | Admitting: Cardiology

## 2019-04-29 ENCOUNTER — Encounter: Payer: Self-pay | Admitting: Cardiology

## 2019-04-29 VITALS — BP 94/64 | HR 93 | Ht 63.0 in | Wt 191.0 lb

## 2019-04-29 DIAGNOSIS — E782 Mixed hyperlipidemia: Secondary | ICD-10-CM | POA: Diagnosis not present

## 2019-04-29 DIAGNOSIS — I10 Essential (primary) hypertension: Secondary | ICD-10-CM

## 2019-04-29 DIAGNOSIS — I2510 Atherosclerotic heart disease of native coronary artery without angina pectoris: Secondary | ICD-10-CM | POA: Insufficient documentation

## 2019-04-29 DIAGNOSIS — I25119 Atherosclerotic heart disease of native coronary artery with unspecified angina pectoris: Secondary | ICD-10-CM | POA: Diagnosis not present

## 2019-04-29 HISTORY — DX: Atherosclerotic heart disease of native coronary artery without angina pectoris: I25.10

## 2019-04-29 HISTORY — DX: Essential (primary) hypertension: I10

## 2019-04-29 HISTORY — DX: Mixed hyperlipidemia: E78.2

## 2019-04-29 MED ORDER — ROSUVASTATIN CALCIUM 20 MG PO TABS: 20.0000 mg | ORAL_TABLET | Freq: Every day | ORAL | 3 refills | Status: DC

## 2019-04-29 MED ORDER — ASPIRIN EC 81 MG PO TBEC: 81.0000 mg | DELAYED_RELEASE_TABLET | Freq: Every day | ORAL | 3 refills | Status: DC

## 2019-04-29 MED ORDER — ISOSORBIDE MONONITRATE ER 30 MG PO TB24: 30.0000 mg | ORAL_TABLET | Freq: Every day | ORAL | 1 refills | Status: DC

## 2019-04-29 NOTE — Progress Notes (Signed)
Cardiology Office Note:    Date:  04/29/2019   ID:  Shameria Truby, DOB [birth year], MRN [id number]  PCP:  Earlyne Iba, NP  Cardiologist:  Berniece Salines, DO  Electrophysiologist:  None   Referring MD: Earlyne Iba, NP   Chief Complaint  Patient presents with  . Follow-up    History of Present Illness:    Karen Baker is a 69 y.o. female with a hx of hypertension, hyperlipidemia, prediabetes who presented today for follow-up visit.  I did see the patient on February 18, 2019 at that time she presented with chest pressure and this was concerning therefore recommended patient undergo a coronary CTA as well as get a transthoracic echocardiogram.  In the interim the patient was able to get her testing done.  She is here to discuss result of her coronary CTA.  Past Medical History:  Diagnosis Date  . Anxiety    takes Xanax daily  . Arthritis   . Constipation    takes Colace daily  . GERD (gastroesophageal reflux disease)    takes Omeprazole daily  . History of blood transfusion    no abnormal reaction noted  . History of bronchitis 3-65yrs ago  . Hyperlipidemia    takes Lovastatin daily  . Hypertension    takes Amlodipine daily  . Joint pain   . Joint swelling   . 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 Primary osteoarthritis of left hip 05/30/2014  . Urinary frequency   . Urinary urgency     Past Surgical History:  Procedure Laterality Date  . ABDOMINAL HYSTERECTOMY  1972  . APPENDECTOMY  1969  . COLONOSCOPY    . cyst removed from ovary    . knee athroscopy Right   . knots removed from neck    . TONSILLECTOMY  1956  . TOTAL HIP ARTHROPLASTY Left 05/30/2014   Procedure: LEFT TOTAL HIP ARTHROPLASTY ANTERIOR APPROACH;  Surgeon: Hessie Dibble, MD;  Location: [facility];  Service: Orthopedics;  Laterality: Left;  . tumors removed from arches in feet Bilateral     Current Medications: Current Meds  Medication Sig  . amLODipine (NORVASC) 5 MG tablet Take 5 mg by mouth daily.  .  furosemide (LASIX) 20 MG tablet Take 40 mg by mouth 2 (two) times daily. Takes 40 mg in am and 20 mg after lunch  . nitroGLYCERIN (NITROSTAT) 0.4 MG SL tablet Place 1 tablet (0.4 mg total) under the tongue every 5 (five) minutes as needed.  Marland Kitchen omeprazole (PRILOSEC) 20 MG capsule Take 20 mg by mouth daily.  . [DISCONTINUED] lisinopril (ZESTRIL) 5 MG tablet Take 5 mg by mouth daily.  . [DISCONTINUED] metoprolol tartrate (LOPRESSOR) 50 MG tablet Take 2 tabs (100 mg) once 2 hours prior to CT appointment if heart rate greater than 55  . [DISCONTINUED] naproxen (NAPROSYN) 500 MG tablet Take 500 mg by mouth 2 (two) times daily with a meal.  . [DISCONTINUED] rosuvastatin (CRESTOR) 5 MG tablet Take 5 mg by mouth 3 (three) times a week.     Allergies:   Azithromycin and Aspirin   Social History   Socioeconomic History  . Marital status: Married    Spouse name: Not on file  . Number of children: Not on file  . Years of education: Not on file  . Highest education level: Not on file  Occupational History  . Not on file  Tobacco Use  . Smoking status: Former Smoker    Packs/day: 1.00    Years: 20.00  Pack years: 20.00    Types: Cigarettes    Quit date: 02/17/2004    Years since quitting: 15.2  . Smokeless tobacco: Never Used  . Tobacco comment: no cigs in over a week  Substance and Sexual Activity  . Alcohol use: No  . Drug use: No  . Sexual activity: Yes    Birth control/protection: Surgical  Other Topics Concern  . Not on file  Social History Narrative  . Not on file   Social Determinants of Health   Financial Resource Strain:   . Difficulty of Paying Living Expenses: Not on file  Food Insecurity:   . Worried About Charity fundraiser in the Last Year: Not on file  . Ran Out of Food in the Last Year: Not on file  Transportation Needs:   . Lack of Transportation (Medical): Not on file  . Lack of Transportation (Non-Medical): Not on file  Physical Activity:   . Days of Exercise  per Week: Not on file  . Minutes of Exercise per Session: Not on file  Stress:   . Feeling of Stress : Not on file  Social Connections:   . Frequency of Communication with Friends and Family: Not on file  . Frequency of Social Gatherings with Friends and Family: Not on file  . Attends Religious Services: Not on file  . Active Member of Clubs or Organizations: Not on file  . Attends Archivist Meetings: Not on file  . Marital Status: Not on file     Family History: The patient's family history includes Cancer in her sister; Hypertension in her brother and mother; Stroke in her mother.  ROS:   Review of Systems  Constitution: Negative for decreased appetite, fever and weight gain.  HENT: Negative for congestion, ear discharge, hoarse voice and sore throat.   Eyes: Negative for discharge, redness, vision loss in right eye and visual halos.  Cardiovascular: Negative for chest pain, dyspnea on exertion, leg swelling, orthopnea and palpitations.  Respiratory: Negative for cough, hemoptysis, shortness of breath and snoring.   Endocrine: Negative for heat intolerance and polyphagia.  Hematologic/Lymphatic: Negative for bleeding problem. Does not bruise/bleed easily.  Skin: Negative for flushing, nail changes, rash and suspicious lesions.  Musculoskeletal: Negative for arthritis, joint pain, muscle cramps, myalgias, neck pain and stiffness.  Gastrointestinal: Negative for abdominal pain, bowel incontinence, diarrhea and excessive appetite.  Genitourinary: Negative for decreased libido, genital sores and incomplete emptying.  Neurological: Negative for brief paralysis, focal weakness, headaches and loss of balance.  Psychiatric/Behavioral: Negative for altered mental status, depression and suicidal ideas.  Allergic/Immunologic: Negative for HIV exposure and persistent infections.    EKGs/Labs/Other Studies Reviewed:    The following studies were reviewed today:   EKG:  The ekg  ordered today demonstrates   Coronary CTA  Normal pulmonary vein drainage into the left atrium.  Normal left atrial appendage without a thrombus.  Normal size of the pulmonary artery.  Moderate mitral annular calcification  IMPRESSION: 1. 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 Moderate stenosis in the mid LAD, ostial first diagonal, and distal left circumflex. CT FFR will be performed and reported separately.  2. The patient's coronary artery calcium score is 1003, which places the patient in the 97th percentile.  3. Normal coronary origin with left dominance.  FFR CT: 1. Left Main:  No significant stenosis. FFR = 0.98  2. LAD: No significant stenosis. Proximal FFR = 0.94, Mid FFR = 0.89, Distal FFR =  could not be mapped 3. LCX: No significant stenosis. Proximal FFR = 0.97, Distal FFR = 0.82, PDA FFR = 0.71, distally 0.66 4. RCA: No significant stenosis. Proximal FFR = 0.93, remainder of vessel could not be mapped.  IMPRESSION: 1. CT FFR showed hemodynamically significant lesion in the proximal PDA arising from the large, dominant left circumflex artery. PDA measures 2.3 mm proximal to the stenosis and 2 mm distal to the stenosis.    2D echo 01/13/2020 IMPRESSIONS  1. Left ventricular ejection fraction, by visual estimation, is 60 to  65%. The left ventricle has normal function. There is no left ventricular  hypertrophy.  2. Left ventricular diastolic parameters are consistent with Grade I  diastolic dysfunction (impaired relaxation).  3. Left atrial size was normal.  4. Right atrial size was normal.  5. The mitral valve is normal in structure. No evidence of mitral valve  regurgitation. No evidence of mitral stenosis.  6. The tricuspid valve is normal in structure.  7. The aortic valve is normal in structure. Aortic valve regurgitation is  not visualized. No evidence of aortic valve sclerosis or stenosis.  8. The pulmonic valve was normal  in structure. Pulmonic valve  regurgitation is not visualized.  9. The left ventricle has no regional wall motion abnormalities.   Recent Labs: 04/25/2019: BUN 19; Creatinine, Ser 1.17; Potassium 4.5; Sodium 136  Recent Lipid Panel No results found for: CHOL, TRIG, HDL, CHOLHDL, VLDL, LDLCALC, LDLDIRECT  Physical Exam:    VS:  BP 94/64 (BP Location: Left Arm, Patient Position: Sitting, Cuff Size: Normal)   Pulse 93   Ht 5\' 3"  (1.6 m)   Wt 191 lb (86.6 kg)   SpO2 95%   BMI 33.83 kg/m     Wt Readings from Last 3 Encounters:  04/29/19 191 lb (86.6 kg)  02/18/19 198 lb (89.8 kg)  02/10/19 199 lb (90.3 kg)     GEN: Well nourished, well developed in no acute distress HEENT: Normal NECK: No JVD; No carotid bruits LYMPHATICS: No lymphadenopathy CARDIAC: S1S2 noted,RRR, no murmurs, rubs, gallops RESPIRATORY:  Clear to auscultation without rales, wheezing or rhonchi  ABDOMEN: Soft, non-tender, non-distended, +bowel sounds, no guarding. EXTREMITIES: No edema, No cyanosis, no clubbing MUSCULOSKELETAL:  No deformity  SKIN: Warm and dry NEUROLOGIC:  Alert and oriented x 3, non-focal PSYCHIATRIC:  Normal affect, good insight  ASSESSMENT:    1. Coronary artery disease involving native coronary artery of native heart with angina pectoris ([place])   2. Essential hypertension   3. Mixed hyperlipidemia    PLAN:     Her coronary CTA today shows evidence of significant coronary disease.  Shared decision with the patient, I will start with aggressive medical management.  She will be placed on aspirin 81 mg daily, Crestor has been increased to 20 mg daily, will start Imdur 30 mg daily as well.  I discussed in great detail with the patient about the start of aspirin 81 mg daily as she has a stated on her allergies as she just cannot take aspirin.  The patient tells me that as a child she was told that she had blood disorder after getting a tonsillectomy.  But she notes that she had been able to  tolerate aspirin 325 twice daily in the past when she had a hip surgery, therefore with her diagnosis of coronary artery disease and need for aspirin she will be willing to go ahead and take this medication.  I have educated her that even though  she is on medical management I will need to know immediately if she started experience any worsening chest pain or if her symptoms does not resolve.  She understands that the next step may be cardiac catheterization, but at this time is willing to try the aggressive medical therapy first.  Her blood pressure is on the lower side with her systolic blood pressure at 94 mmHg.  In the office today.  I am going to stop the lisinopril 5 mg daily.  Blood pressure doctor remains to be less than 130/80 mmHg provided we can keep her systolic over [id number].  Hyperlipidemia-I have increase her Crestor to 20 mg daily.  We will repeat her lipid profile to make sure that she is at goal LDL less than 70.  Blood work will be done today which will include CBC, PT INR, PTT  The patient is in agreement with the above plan. The patient left the office in stable condition.  The patient will follow up in 3 months or sooner if needed.   Medication Adjustments/Labs and Tests Ordered: Current medicines are reviewed at length with the patient today.  Concerns regarding medicines are outlined above.  Orders Placed This Encounter  Procedures  . CBC  . Protime-INR ( [facility] ONLY)  . PTT  . Lipid Profile   Meds ordered this encounter  Medications  . aspirin EC 81 MG tablet    Sig: Take 1 tablet (81 mg total) by mouth daily.    Dispense:  90 tablet    Refill:  3  . isosorbide mononitrate (IMDUR) 30 MG 24 hr tablet    Sig: Take 1 tablet (30 mg total) by mouth daily.    Dispense:  90 tablet    Refill:  1  . rosuvastatin (CRESTOR) 20 MG tablet    Sig: Take 1 tablet (20 mg total) by mouth daily.    Dispense:  90 tablet    Refill:  3    Patient Instructions  Medication  Instructions:  Your physician has recommended you make the following change in your medication:   STOP: Lisinopril  START; Aspirin 81 mg Take 1 tab daily START: Imdur(isosorbide) 30 mg Take 1 tab daily  INCREASE: Crestor to 20 mg Take  1 tab daily ( May take 4 tabs of 5 mg daily until you run out)  *If you need a refill on your cardiac medications before your next appointment, please call your pharmacy*  Lab Work: Your physician recommends that you return for lab work in: TODAY CBC,PT/INR,PTT  BEFORE NEXT VISIT: Fasting LIPID  If you have labs (blood work) drawn today and your tests are completely normal, you will receive your results only by: Marland Kitchen MyChart Message (if you have MyChart) OR . A paper copy in the mail If you have any lab test that is abnormal or we need to change your treatment, we will call you to review the results.  Testing/Procedures: None  Follow-Up: At [facility], you and your health needs are our priority.  As part of our continuing mission to provide you with exceptional heart care, we have created designated Provider Care Teams.  These Care Teams include your primary Cardiologist (physician) and Advanced Practice Providers (APPs -  Physician Assistants and Nurse Practitioners) who all work together to provide you with the care you need, when you need it.  Your next appointment:   3 month(s)  The format for your next appointment:   In Person  Provider:   Berniece Salines, DO  Other Instructions      Adopting a Healthy Lifestyle.  Know what a healthy weight is for you (roughly BMI <25) and aim to maintain this   Aim for 7+ servings of fruits and vegetables daily   65-80+ fluid ounces of water or unsweet tea for healthy kidneys   Limit to max 1 drink of alcohol per day; avoid smoking/tobacco   Limit animal fats in diet for cholesterol and heart health - choose grass fed whenever available   Avoid highly processed foods, and foods high in  saturated/trans fats   Aim for low stress - take time to unwind and care for your mental health   Aim for 150 min of moderate intensity exercise weekly for heart health, and weights twice weekly for bone health   Aim for 7-9 hours of sleep daily   When it comes to diets, agreement about the perfect plan isnt easy to find, even among the experts. Experts at the [employer] developed an idea known as the Healthy Eating Plate. Just imagine a plate divided into logical, healthy portions.   The emphasis is on diet quality:   Load up on vegetables and fruits - one-half of your plate: Aim for color and variety, and remember that potatoes dont count.   Go for whole grains - one-quarter of your plate: Whole wheat, barley, wheat berries, quinoa, oats, brown rice, and foods made with them. If you want pasta, go with whole wheat pasta.   Protein power - one-quarter of your plate: Fish, chicken, beans, and nuts are all healthy, versatile protein sources. Limit red meat.   The diet, however, does go beyond the plate, offering a few other suggestions.   Use healthy plant oils, such as olive, canola, soy, corn, sunflower and peanut. Check the labels, and avoid partially hydrogenated oil, which have unhealthy trans fats.   If youre thirsty, drink water. Coffee and tea are good in moderation, but skip sugary drinks and limit milk and dairy products to one or two daily servings.   The type of carbohydrate in the diet is more important than the amount. Some sources of carbohydrates, such as vegetables, fruits, whole grains, and beans-are healthier than others.   Finally, stay active  Signed, Berniece Salines, DO  04/29/2019 4:53 PM    [facility]

## 2019-04-29 NOTE — Patient Instructions (Signed)
Medication Instructions:  Your physician has recommended you make the following change in your medication:   STOP: Lisinopril  START; Aspirin 81 mg Take 1 tab daily START: Imdur(isosorbide) 30 mg Take 1 tab daily  INCREASE: Crestor to 20 mg Take  1 tab daily ( May take 4 tabs of 5 mg daily until you run out)  *If you need a refill on your cardiac medications before your next appointment, please call your pharmacy*  Lab Work: Your physician recommends that you return for lab work in: TODAY CBC,PT/INR,PTT  BEFORE NEXT VISIT: Fasting LIPID  If you have labs (blood work) drawn today and your tests are completely normal, you will receive your results only by: Marland Kitchen MyChart Message (if you have MyChart) OR . A paper copy in the mail If you have any lab test that is abnormal or we need to change your treatment, we will call you to review the results.  Testing/Procedures: None  Follow-Up: At [facility], you and your health needs are our priority.  As part of our continuing mission to provide you with exceptional heart care, we have created designated Provider Care Teams.  These Care Teams include your primary Cardiologist (physician) and Advanced Practice Providers (APPs -  Physician Assistants and Nurse Practitioners) who all work together to provide you with the care you need, when you need it.  Your next appointment:   3 month(s)  The format for your next appointment:   In Person  Provider:   Berniece Salines, DO  Other Instructions

## 2019-04-30 LAB — CBC
Hematocrit: 36.6 % (ref 34.0–46.6)
Hemoglobin: 12.5 g/dL (ref 11.1–15.9)
MCH: 27.8 pg (ref 26.6–33.0)
MCHC: 34.2 g/dL (ref 31.5–35.7)
MCV: 82 fL (ref 79–97)
Platelets: 319 10*3/uL (ref 150–450)
RBC: 4.49 x10E6/uL (ref 3.77–5.28)
RDW: 13.1 % (ref 11.7–15.4)
WBC: 11.3 10*3/uL — ABNORMAL HIGH (ref 3.4–10.8)

## 2019-04-30 LAB — PROTIME-INR
INR: 1 (ref 0.9–1.2)
Prothrombin Time: 10.3 s (ref 9.1–12.0)

## 2019-04-30 LAB — APTT: aPTT: 29 s (ref 24–33)

## 2019-05-11 ENCOUNTER — Telehealth (HOSPITAL_COMMUNITY): Payer: Self-pay

## 2019-05-11 NOTE — Telephone Encounter (Signed)

## 2019-05-12 ENCOUNTER — Ambulatory Visit (HOSPITAL_COMMUNITY)
Admission: RE | Admit: 2019-05-12 | Discharge: 2019-05-12 | Disposition: A | Discharge status: Home / Self Care | Payer: Medicare PPO | Source: Ambulatory Visit | Attending: Surgery | Admitting: Surgery

## 2019-05-12 ENCOUNTER — Other Ambulatory Visit: Payer: Self-pay

## 2019-05-12 ENCOUNTER — Encounter: Payer: Self-pay | Admitting: Physician Assistant

## 2019-05-12 ENCOUNTER — Ambulatory Visit: Payer: Medicare PPO | Admitting: Physician Assistant

## 2019-05-12 VITALS — BP 131/91 | HR 83 | Temp 98.1°F | Ht 64.0 in | Wt 188.5 lb

## 2019-05-12 DIAGNOSIS — I872 Venous insufficiency (chronic) (peripheral): Secondary | ICD-10-CM

## 2019-05-12 DIAGNOSIS — R202 Paresthesia of skin: Secondary | ICD-10-CM | POA: Diagnosis not present

## 2019-05-12 DIAGNOSIS — I83893 Varicose veins of bilateral lower extremities with other complications: Secondary | ICD-10-CM

## 2019-05-12 NOTE — Progress Notes (Signed)
Requested by: Dr. Evangeline Gula, MD [address] [place],  [place] [address]  Reason for consultation: varicose veins   History of Present Illness   Karen Baker is a 69 y.o. (1951/01/19) female who presents with chief complaint: varicose veins. She says she has had visible veins around her ankles for many years. 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 She says at the time she wore pantyhose compression stockings but never had any real issues with swelling at that time. Patient notes, onset of swelling approximately 1 year ago, associated with prolonged ambulation or sitting. The patient's symptoms include: tenderness to touch or any compression of right or left legs, increased visible varicose and spider veins, and occasional intermittent red rash on anterior legs.  The patient has a very remote history of DVT in her left leg in her 12s. She states she does not believe there was any causative event and she doesn't recall having any medical treatment for it at the time. She has had no recurrence of DVT that she knows of. No history of venous stasis ulcers, no hx of bleeding varicose veins or skin changes, and no history of Lymphedema. There is no family history of venous disorders.  The patient has tried OTC compression stockings about 1 month ago and states she only tolerated wearing them for 10 minutes. She has not tried wearing them again since. She does routinely elevate her legs and use warm compresses/heated blankets with relief.  Patient was recently seen by her cardiologist on 04/29/19 due to chest pressure. She was evaluated with a coronary CTA which found that she has significant coronary artery disease. Her cardiologist recommended initially aggressive medical therapy with Aspirin 81 mg, Crestor 20 mg and Imdur 30 mg. She is suppose to follow up with them in 3 months  Past Medical History:  Diagnosis Date  . Anxiety    takes Xanax daily  .  Arthritis   . Constipation    takes Colace daily  . GERD (gastroesophageal reflux disease)    takes Omeprazole daily  . History of blood transfusion    no abnormal reaction noted  . History of bronchitis 3-33yrs ago  . Hyperlipidemia    takes Lovastatin daily  . Hypertension    takes Amlodipine daily  . Joint pain   . Joint swelling   . 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 Primary osteoarthritis of left hip 05/30/2014  . Urinary frequency   . Urinary urgency     Past Surgical History:  Procedure Laterality Date  . ABDOMINAL HYSTERECTOMY  1972  . APPENDECTOMY  1969  . COLONOSCOPY    . cyst removed from ovary    . knee athroscopy Right   . knots removed from neck    . TONSILLECTOMY  1956  . TOTAL HIP ARTHROPLASTY Left 05/30/2014   Procedure: LEFT TOTAL HIP ARTHROPLASTY ANTERIOR APPROACH;  Surgeon: Hessie Dibble, MD;  Location: [facility];  Service: Orthopedics;  Laterality: Left;  . tumors removed from arches in feet Bilateral     Social History   Socioeconomic History  . Marital status: Married    Spouse name: Not on file  . Number of children: Not on file  . Years of education: Not on file  . Highest education level: Not on file  Occupational History  . Not on file  Tobacco Use  . Smoking status: Former Smoker    Packs/day: 1.00  Years: 20.00    Pack years: 20.00    Types: Cigarettes    Quit date: 02/17/2004    Years since quitting: 15.2  . Smokeless tobacco: Never Used  . Tobacco comment: no cigs in over a week  Substance and Sexual Activity  . Alcohol use: No  . Drug use: No  . Sexual activity: Yes    Birth control/protection: Surgical  Other Topics Concern  . Not on file  Social History Narrative  . Not on file   Social Determinants of Health   Financial Resource Strain:   . Difficulty of Paying Living Expenses: Not on file  Food Insecurity:   . Worried About Charity fundraiser in the Last Year: Not on file  . Ran Out of Food in the Last Year: Not on file   Transportation Needs:   . Lack of Transportation (Medical): Not on file  . Lack of Transportation (Non-Medical): Not on file  Physical Activity:   . Days of Exercise per Week: Not on file  . Minutes of Exercise per Session: Not on file  Stress:   . Feeling of Stress : Not on file  Social Connections:   . Frequency of Communication with Friends and Family: Not on file  . Frequency of Social Gatherings with Friends and Family: Not on file  . Attends Religious Services: Not on file  . Active Member of Clubs or Organizations: Not on file  . Attends Archivist Meetings: Not on file  . Marital Status: Not on file  Intimate Partner Violence:   . Fear of Current or Ex-Partner: Not on file  . Emotionally Abused: Not on file  . Physically Abused: Not on file  . Sexually Abused: Not on file    Family History  Problem Relation Age of Onset  . Hypertension Mother   . Stroke Mother   . Cancer Sister   . Hypertension Brother     Current Outpatient Medications  Medication Sig Dispense Refill  . amLODipine (NORVASC) 5 MG tablet Take 5 mg by mouth daily.    Marland Kitchen aspirin EC 81 MG tablet Take 1 tablet (81 mg total) by mouth daily. 90 tablet 3  . furosemide (LASIX) 20 MG tablet Take 40 mg by mouth 2 (two) times daily. Takes 40 mg in am and 20 mg after lunch    . isosorbide mononitrate (IMDUR) 30 MG 24 hr tablet Take 1 tablet (30 mg total) by mouth daily. 90 tablet 1  . nitroGLYCERIN (NITROSTAT) 0.4 MG SL tablet Place 1 tablet (0.4 mg total) under the tongue every 5 (five) minutes as needed. 30 tablet 3  . omeprazole (PRILOSEC) 20 MG capsule Take 20 mg by mouth daily.    . rosuvastatin (CRESTOR) 20 MG tablet Take 1 tablet (20 mg total) by mouth daily. 90 tablet 3   No current facility-administered medications for this visit.    Allergies  Allergen Reactions  . Azithromycin Hives  . Aspirin     States she just doesn't take it. Has trouble with blood clotting    REVIEW OF SYSTEMS  (negative unless checked):   Cardiac:  [x]  Chest pain or chest pressure? [x]  Shortness of breath upon activity? []  Shortness of breath when lying flat? []  Irregular heart rhythm?  Vascular:  []  Pain in calf, thigh, or hip brought on by walking? []  Pain in feet at night that wakes you up from your sleep? []  Blood clot in your veins? []  Leg swelling?  Pulmonary:  []   Oxygen at home? []  Productive cough? []  Wheezing?  Neurologic:  []  Sudden weakness in arms or legs? []  Sudden numbness in arms or legs? []  Sudden onset of difficult speaking or slurred speech? []  Temporary loss of vision in one eye? []  Problems with dizziness?  Gastrointestinal:  []  Blood in stool? []  Vomited blood?  Genitourinary:  []  Burning when urinating? []  Blood in urine?  Psychiatric:  []  Major depression  Hematologic:  []  Bleeding problems? []  Problems with blood clotting?  Dermatologic:  []  Rashes or ulcers?  Constitutional:  []  Fever or chills?  Ear/Nose/Throat:  []  Change in hearing? []  Nose bleeds? []  Sore throat?  Musculoskeletal:  []  Back pain? []  Joint pain? []  Muscle pain?   Physical Examination     Vitals:   05/12/19 1406  BP: (!) 131/91  Pulse: 83  Temp: 98.1 F (36.7 C)  TempSrc: Temporal  SpO2: 97%  Weight: 188 lb 8 oz (85.5 kg)  Height: 5\' 4"  (1.626 m)   Body mass index is 32.36 kg/m.  General Alert and oriented, appears well, not in any pain  Head [place]/AT     Pulmonary Sym exp, clear to auscultation bilaterally  Cardiac Regular rate and rhythm, no Murmurs  Vascular Vessel Right Left  Radial Palpable Palpable  Brachial Palpable Palpable  Carotid Palpable, No Bruit Palpable, No Bruit  Aorta Not palpable N/A  Femoral Palpable Palpable  Popliteal palpable Palpable   PT Faintly palpable Faintly palpable  DP Not palpable Palpable    Gastro- intestinal soft, non-distended, non-tender to palpation, no masses  Musculo- skeletal M/S 5/5 throughout ,  Extremities without ischemic changes , No edema present, Varicosities present: right thigh, right leg, right ankle, left leg, left ankle, No Lipodermatosclerosis present. Hypersensitivity to palpation of both right and left legs even to light touch  Neurologic Cranial nerves 2-12 intact, Pain and light touch intact in extremities  Psychiatric normal Mood & affect   Dermatologic See M/S exam for extremity exam, Rashes present: mild macular rash on anterior right and left legs  Lymphatic  Palpable lymph nodes: None    Non-invasive Vascular Imaging   BLE Venous Insufficiency Duplex (05/12/19):   RLE:   No DVT and SVT  GSV reflux in the proximal and distal calf  no SSV reflux,  popliteal deep venous reflux  LLE:  no DVT and SVT  No GSV reflux   there is SSV reflux  No deep venous reflux  Bilateral lower extremities without SFJ reflux. No deep reflux. No DVT. There is mild superficial reflux bilaterally. Bilateral lower extremities with very small veins  Medical Decision Making   Magdaline Carnell is a 69 y.o. female who presents with: LE chronic venous insufficiency, and varicose veins. A lot of her lower extremity symptoms are hypersensitivity to touch which I feel is likely not related to her venous disease. I recommend she follow up with her PCP for further evaluation   Based on the patient's history and examination, I recommend exercise, elevation of bilateral lower extremities daily, and compression stockings.  I discussed with the patient the use of her 20-30 mm thigh high compression stockings and need for 3 month trial of such. She was measured at today's visit and given information for the [employer]   Based on her duplex she would not be a candidate for venous ablation. She does have some spider veins that she could potentially have sclerotherapy for but at this time she would like to continue conservative therapy  She  will follow up in 3 months with [facility]  clinic   Karoline Caldwell, PA-C [facility]: [phone]  05/12/2019, 2:48 PM  Clinic MD: Dr. Scot Dock

## 2019-05-23 ENCOUNTER — Ambulatory Visit: Payer: Medicare Other | Admitting: Cardiology

## 2019-06-24 ENCOUNTER — Encounter (HOSPITAL_COMMUNITY): Payer: Self-pay

## 2019-06-24 ENCOUNTER — Other Ambulatory Visit: Payer: Self-pay

## 2019-06-24 ENCOUNTER — Other Ambulatory Visit (HOSPITAL_COMMUNITY): Payer: Self-pay

## 2019-06-24 ENCOUNTER — Inpatient Hospital Stay (HOSPITAL_COMMUNITY)
Admission: EM | Admit: 2019-06-24 | Discharge: 2019-06-28 | DRG: 287 | Disposition: A | Discharge status: Home / Self Care | Payer: Medicare PPO | Attending: Cardiovascular Disease | Admitting: Cardiovascular Disease

## 2019-06-24 ENCOUNTER — Emergency Department (HOSPITAL_COMMUNITY): Payer: Medicare PPO

## 2019-06-24 DIAGNOSIS — Z79899 Other long term (current) drug therapy: Secondary | ICD-10-CM

## 2019-06-24 DIAGNOSIS — Z87891 Personal history of nicotine dependence: Secondary | ICD-10-CM | POA: Diagnosis not present

## 2019-06-24 DIAGNOSIS — Z888 Allergy status to other drugs, medicaments and biological substances status: Secondary | ICD-10-CM | POA: Diagnosis not present

## 2019-06-24 DIAGNOSIS — E782 Mixed hyperlipidemia: Secondary | ICD-10-CM | POA: Diagnosis not present

## 2019-06-24 DIAGNOSIS — I071 Rheumatic tricuspid insufficiency: Secondary | ICD-10-CM | POA: Diagnosis present

## 2019-06-24 DIAGNOSIS — I10 Essential (primary) hypertension: Secondary | ICD-10-CM | POA: Diagnosis not present

## 2019-06-24 DIAGNOSIS — E118 Type 2 diabetes mellitus with unspecified complications: Secondary | ICD-10-CM

## 2019-06-24 DIAGNOSIS — Z6830 Body mass index (BMI) 30.0-30.9, adult: Secondary | ICD-10-CM | POA: Diagnosis not present

## 2019-06-24 DIAGNOSIS — R0789 Other chest pain: Secondary | ICD-10-CM | POA: Diagnosis not present

## 2019-06-24 DIAGNOSIS — I200 Unstable angina: Secondary | ICD-10-CM

## 2019-06-24 DIAGNOSIS — E119 Type 2 diabetes mellitus without complications: Secondary | ICD-10-CM | POA: Diagnosis present

## 2019-06-24 DIAGNOSIS — R079 Chest pain, unspecified: Secondary | ICD-10-CM | POA: Diagnosis not present

## 2019-06-24 DIAGNOSIS — K5900 Constipation, unspecified: Secondary | ICD-10-CM | POA: Diagnosis not present

## 2019-06-24 DIAGNOSIS — I25119 Atherosclerotic heart disease of native coronary artery with unspecified angina pectoris: Secondary | ICD-10-CM | POA: Diagnosis not present

## 2019-06-24 DIAGNOSIS — I452 Bifascicular block: Secondary | ICD-10-CM | POA: Diagnosis present

## 2019-06-24 DIAGNOSIS — E669 Obesity, unspecified: Secondary | ICD-10-CM | POA: Diagnosis present

## 2019-06-24 DIAGNOSIS — F419 Anxiety disorder, unspecified: Secondary | ICD-10-CM | POA: Diagnosis not present

## 2019-06-24 DIAGNOSIS — I25110 Atherosclerotic heart disease of native coronary artery with unstable angina pectoris: Principal | ICD-10-CM | POA: Diagnosis present

## 2019-06-24 DIAGNOSIS — K219 Gastro-esophageal reflux disease without esophagitis: Secondary | ICD-10-CM | POA: Diagnosis present

## 2019-06-24 DIAGNOSIS — E876 Hypokalemia: Secondary | ICD-10-CM | POA: Diagnosis not present

## 2019-06-24 DIAGNOSIS — Z7982 Long term (current) use of aspirin: Secondary | ICD-10-CM | POA: Diagnosis not present

## 2019-06-24 DIAGNOSIS — Z20822 Contact with and (suspected) exposure to covid-19: Secondary | ICD-10-CM | POA: Diagnosis not present

## 2019-06-24 DIAGNOSIS — I2510 Atherosclerotic heart disease of native coronary artery without angina pectoris: Secondary | ICD-10-CM | POA: Diagnosis present

## 2019-06-24 DIAGNOSIS — I213 ST elevation (STEMI) myocardial infarction of unspecified site: Secondary | ICD-10-CM | POA: Diagnosis not present

## 2019-06-24 DIAGNOSIS — Z8249 Family history of ischemic heart disease and other diseases of the circulatory system: Secondary | ICD-10-CM | POA: Diagnosis not present

## 2019-06-24 DIAGNOSIS — Z96642 Presence of left artificial hip joint: Secondary | ICD-10-CM | POA: Diagnosis present

## 2019-06-24 LAB — CBC WITH DIFFERENTIAL/PLATELET
Abs Immature Granulocytes: 0.03 10*3/uL (ref 0.00–0.07)
Basophils Absolute: 0 10*3/uL (ref 0.0–0.1)
Basophils Relative: 1 %
Eosinophils Absolute: 0 10*3/uL (ref 0.0–0.5)
Eosinophils Relative: 0 %
HCT: 32.2 % — ABNORMAL LOW (ref 36.0–46.0)
Hemoglobin: 10.6 g/dL — ABNORMAL LOW (ref 12.0–15.0)
Immature Granulocytes: 0 %
Lymphocytes Relative: 8 %
Lymphs Abs: 0.7 10*3/uL (ref 0.7–4.0)
MCH: 27.1 pg (ref 26.0–34.0)
MCHC: 32.9 g/dL (ref 30.0–36.0)
MCV: 82.4 fL (ref 80.0–100.0)
Monocytes Absolute: 0.5 10*3/uL (ref 0.1–1.0)
Monocytes Relative: 6 %
Neutro Abs: 6.7 10*3/uL (ref 1.7–7.7)
Neutrophils Relative %: 85 %
Platelets: 219 10*3/uL (ref 150–400)
RBC: 3.91 MIL/uL (ref 3.87–5.11)
RDW: 13.7 % (ref 11.5–15.5)
WBC: 7.9 10*3/uL (ref 4.0–10.5)
nRBC: 0 % (ref 0.0–0.2)

## 2019-06-24 LAB — BASIC METABOLIC PANEL
Anion gap: 9 (ref 5–15)
BUN: 13 mg/dL (ref 8–23)
CO2: 27 mmol/L (ref 22–32)
Calcium: 10.3 mg/dL (ref 8.9–10.3)
Chloride: 98 mmol/L (ref 98–111)
Creatinine, Ser: 0.99 mg/dL (ref 0.44–1.00)
GFR calc Af Amer: >60 mL/min (ref >60)
GFR calc non Af Amer: 58 mL/min — ABNORMAL LOW (ref >60)
Glucose, Bld: 157 mg/dL — ABNORMAL HIGH (ref 70–99)
Potassium: 3 mmol/L — ABNORMAL LOW (ref 3.5–5.1)
Sodium: 134 mmol/L — ABNORMAL LOW (ref 135–145)

## 2019-06-24 LAB — CBG MONITORING, ED: Glucose-Capillary: 153 mg/dL — ABNORMAL HIGH (ref 70–99)

## 2019-06-24 LAB — TROPONIN I (HIGH SENSITIVITY)
Troponin I (High Sensitivity): 11 ng/L (ref <18)
Troponin I (High Sensitivity): 12 ng/L (ref <18)

## 2019-06-24 MED ORDER — NITROGLYCERIN 2 % TD OINT
1.0000 [in_us] | TOPICAL_OINTMENT | Freq: Four times a day (QID) | TRANSDERMAL | Status: DC
  Administered 2019-06-24: 1 [in_us] via TOPICAL
  Filled 2019-06-24: qty 1

## 2019-06-24 MED ORDER — ACETAMINOPHEN 325 MG PO TABS
650.0000 mg | ORAL_TABLET | Freq: Four times a day (QID) | ORAL | Status: DC | PRN
  Administered 2019-06-24 – 2019-06-26 (×3): 650 mg via ORAL
  Filled 2019-06-24 (×3): qty 2

## 2019-06-24 NOTE — H&P (Addendum)
Cardiology Admission History and Physical:   Patient ID: Karen Baker MRN: [id number]; DOB: Mar 04, 1951   Admission date: 06/24/2019  Primary Care Provider: Earlyne Iba, NP Primary Cardiologist: Berniece Salines, DO  Primary Electrophysiologist:  None   Chief Complaint:  Chest pain  Patient Profile:   Karen Baker is a 69 y.o. female with multivessel CAD, HTN, HLD, prediabetes presents with chest pain.   History of Present Illness:   Karen Baker has been followed by cardiologist Dr Harriet Masson. She has reported symptoms of chest pain in the past and underwent coronary CTA in January of 2021. This showed a hemodynamically significant lesion in the proximal PDA arising from a large dominant left circumflex artery. Her most recent echo shows normal LVEF. She opted for medical treatment of her CAD. She was started on crestor, aspirin, and imdur. She was not treated with a beta blocker, possibly due to borderline low blood pressure. She has been taking all of her medications as prescribed since that time.   The patient now presents with worsening chest pain. She states that over the past few weeks she has had occasional, very mild chest pain with exertion approximately once per week that only lasts for a few seconds.  This has been manageable for her and she has not had to take any nitroglycerin.  Earlier today however, she developed sudden onset severe chest pain/pressure while she was laying on the couch watching TV.  She had never had symptoms this severe before.  She took 1 nitroglycerin tablet with minimal relief in her symptoms.  She took a second nitroglycerin tablet and her symptoms improved substantially.  She came to the emergency department for evaluation.  In the ED, the patient was initially hypertensive with systolic blood pressures in the 160s, however this improved to the 130s.  The rest of her vital signs were within normal limits.  Her ECG was similar to baseline without any ischemic  changes.  Her troponin was initially 11 and on follow-up 2 hours later was 12.  Cardiology was consulted for further management.  Heart Pathway Score:     Past Medical History:  Diagnosis Date  . Anxiety    takes Xanax daily  . Arthritis   . Constipation    takes Colace daily  . GERD (gastroesophageal reflux disease)    takes Omeprazole daily  . History of blood transfusion    no abnormal reaction noted  . History of bronchitis 3-69yrs ago  . Hyperlipidemia    takes Lovastatin daily  . Hypertension    takes Amlodipine daily  . Joint pain   . Joint swelling   . 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 Primary osteoarthritis of left hip 05/30/2014  . Urinary frequency   . Urinary urgency     Past Surgical History:  Procedure Laterality Date  . ABDOMINAL HYSTERECTOMY  1972  . APPENDECTOMY  1969  . COLONOSCOPY    . cyst removed from ovary    . knee athroscopy Right   . knots removed from neck    . TONSILLECTOMY  1956  . TOTAL HIP ARTHROPLASTY Left 05/30/2014   Procedure: LEFT TOTAL HIP ARTHROPLASTY ANTERIOR APPROACH;  Surgeon: Hessie Dibble, MD;  Location: [facility];  Service: Orthopedics;  Laterality: Left;  . tumors removed from arches in feet Bilateral      Medications Prior to Admission: Prior to Admission medications   Medication Sig Start Date End Date Taking? Authorizing Provider  amLODipine (NORVASC) 5 MG tablet Take 5 mg  by mouth daily.    [provider]  aspirin EC 81 MG tablet Take 1 tablet (81 mg total) by mouth daily. 04/29/19   Tobb, Kardie, DO  furosemide (LASIX) 20 MG tablet Take 40 mg by mouth 2 (two) times daily. Takes 40 mg in am and 20 mg after lunch    [provider]  isosorbide mononitrate (IMDUR) 30 MG 24 hr tablet Take 1 tablet (30 mg total) by mouth daily. 04/29/19 07/28/19  Tobb, Kardie, DO  nitroGLYCERIN (NITROSTAT) 0.4 MG SL tablet Place 1 tablet (0.4 mg total) under the tongue every 5 (five) minutes as needed. 02/18/19 05/19/19  Tobb, Kardie, DO   omeprazole (PRILOSEC) 20 MG capsule Take 20 mg by mouth daily.    [provider]  rosuvastatin (CRESTOR) 20 MG tablet Take 1 tablet (20 mg total) by mouth daily. 04/29/19 07/28/19  Berniece Salines, DO     Allergies:    Allergies  Allergen Reactions  . Azithromycin Hives  . Aspirin     States she just doesn't take it. Has trouble with blood clotting    Social History:   Social History   Socioeconomic History  . Marital status: Married    Spouse name: Not on file  . Number of children: Not on file  . Years of education: Not on file  . Highest education level: Not on file  Occupational History  . Not on file  Tobacco Use  . Smoking status: Former Smoker    Packs/day: 1.00    Years: 20.00    Pack years: 20.00    Types: Cigarettes    Quit date: 02/17/2004    Years since quitting: 15.3  . Smokeless tobacco: Never Used  . Tobacco comment: no cigs in over a week  Substance and Sexual Activity  . Alcohol use: No  . Drug use: No  . Sexual activity: Yes    Birth control/protection: Surgical  Other Topics Concern  . Not on file  Social History Narrative  . Not on file   Social Determinants of Health   Financial Resource Strain:   . Difficulty of Paying Living Expenses:   Food Insecurity:   . Worried About Charity fundraiser in the Last Year:   . Arboriculturist in the Last Year:   Transportation Needs:   . Film/video editor (Medical):   Marland Kitchen Lack of Transportation (Non-Medical):   Physical Activity:   . Days of Exercise per Week:   . Minutes of Exercise per Session:   Stress:   . Feeling of Stress :   Social Connections:   . Frequency of Communication with Friends and Family:   . Frequency of Social Gatherings with Friends and Family:   . Attends Religious Services:   . Active Member of Clubs or Organizations:   . Attends Archivist Meetings:   Marland Kitchen Marital Status:   Intimate Partner Violence:   . Fear of Current or Ex-Partner:   . Emotionally  Abused:   Marland Kitchen Physically Abused:   . Sexually Abused:     Family History:   The patient's family history includes Cancer in her sister; Hypertension in her brother and mother; Stroke in her mother.    ROS:  Please see the history of present illness.  All other ROS reviewed and negative.     Physical Exam/Data:   Vitals:   06/24/19 1900 06/24/19 1930 06/24/19 2000 06/24/19 2030  BP: 133/67 137/69 127/70 125/68  Pulse: 90 85  85 88  Resp: 16 (!) 23 (!) 23 (!) 23  Temp:      TempSrc:      SpO2: 94% 95% 92% 90%   No intake or output data in the 24 hours ending 06/24/19 2209 Last 3 Weights 05/12/2019 04/29/2019 02/18/2019  Weight (lbs) 188 lb 8 oz 191 lb 198 lb  Weight (kg) 85.503 kg 86.637 kg 89.812 kg     There is no height or weight on file to calculate BMI.  General:  Well nourished, well developed, in no acute distress HEENT: normal Neck: no JVD Vascular: No carotid bruits; FA pulses 2+ bilaterally without bruits  Cardiac:  normal S1, S2; RRR; soft systolic murmur heard throughout the precordium Lungs:  clear to auscultation bilaterally, no wheezing, rhonchi or rales  Abd: soft, nontender, no hepatomegaly  Ext: non-pitting edema throughout the bilateral lower extremities, superficial varicosities Musculoskeletal:  No deformities, BUE and BLE strength normal and equal Skin: warm and dry  Neuro:  CNs 2-12 intact, no focal abnormalities noted Psych:  Normal affect   EKG:  The ECG that was done on arrival to the ED was personally reviewed and demonstrates NSR, RBBB, LAFB, nonspecific repolarization changes similar to prior ECGs  Relevant CV Studies: Coronary CTA from Jan 2021 Coronary CTA  Normal pulmonary vein drainage into the left atrium.  Normal left atrial appendage without a thrombus.  Normal size of the pulmonary artery.  Moderate mitral annular calcification  IMPRESSION: 1. 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 Moderate stenosis in the mid LAD,  ostial first diagonal, and distal left circumflex. CT FFR will be performed and reported separately.  2. The patient's coronary artery calcium score is 1003, which places the patient in the 97th percentile.  3. Normal coronary origin with left dominance.  FFR CT: 1. Left Main: No significant stenosis. FFR = 0.98  2. LAD: No significant stenosis. Proximal FFR = 0.94, Mid FFR = 0.89, Distal FFR = could not be mapped 3. LCX: No significant stenosis. Proximal FFR = 0.97, Distal FFR = 0.82, PDA FFR = 0.71, distally 0.66 4. RCA: No significant stenosis. Proximal FFR = 0.93, remainder of vessel could not be mapped.  IMPRESSION: 1. CT FFR showed hemodynamically significant lesion in the proximal PDA arising from the large, dominant left circumflex artery. PDA measures 2.3 mm proximal to the stenosis and 2 mm distal to the Stenosis.  Echo from 12/2019 1. Left ventricular ejection fraction, by visual estimation, is 60 to  65%. The left ventricle has normal function. There is no left ventricular  hypertrophy.  2. Left ventricular diastolic parameters are consistent with Grade I  diastolic dysfunction (impaired relaxation).  3. Left atrial size was normal.  4. Right atrial size was normal.  5. The mitral valve is normal in structure. No evidence of mitral valve  regurgitation. No evidence of mitral stenosis.  6. The tricuspid valve is normal in structure.  7. The aortic valve is normal in structure. Aortic valve regurgitation is  not visualized. No evidence of aortic valve sclerosis or stenosis.  8. The pulmonic valve was normal in structure. Pulmonic valve  regurgitation is not visualized.  9. The left ventricle has no regional wall motion abnormalities.   Laboratory Data:  High Sensitivity Troponin:   Recent Labs  Lab 06/24/19 1725 06/24/19 1925  TROPONINIHS 11 12      Chemistry Recent Labs  Lab 06/24/19 1725  NA 134*  K 3.0*  CL 98  CO2  27  GLUCOSE 157*   BUN 13  CREATININE 0.99  CALCIUM 10.3  GFRNONAA 58*  GFRAA >60  ANIONGAP 9    No results for input(s): PROT, ALBUMIN, AST, ALT, ALKPHOS, BILITOT in the last 168 hours. Hematology Recent Labs  Lab 06/24/19 1725  WBC 7.9  RBC 3.91  HGB 10.6*  HCT 32.2*  MCV 82.4  MCH 27.1  MCHC 32.9  RDW 13.7  PLT 219   BNPNo results for input(s): BNP, PROBNP in the last 168 hours.  DDimer No results for input(s): DDIMER in the last 168 hours.   Radiology/Studies:  DG Chest Portable 1 View  Result Date: 06/24/2019 CLINICAL DATA:  Chest pain. EXAM: PORTABLE CHEST 1 VIEW COMPARISON:  05/26/2016 FINDINGS: The heart size is stable from prior study. Aortic calcifications are noted. There is no pneumothorax. No large pleural effusion. No focal infiltrate. No acute osseous abnormality. IMPRESSION: No active disease. Electronically Signed   By: Constance Holster M.D.   On: 06/24/2019 18:31       HEAR Score (for undifferentiated chest pain):     4 points (moderate risk of MACE)  Assessment and Plan:  Colletta Hu Is a 69 y.o. female with multivessel CAD, HTN, HLD, prediabetes presents with chest pain.   #) Chest pain: known hemodynamically significant CAD from coronary CTA. Now having escalating chest pain despite adherence to anti-anginal therapy. Currently patient chest pain free. Will admit for consideration of invasive coronary assessment. She is aysmptomatic now, however will plan to start heparin for UA if she develops recurrent chest pain. Troponin negative x2.  - check A1c, lipids - admit for likely cath - start metoprolol tartrate 12.5mg  q6h - cont Imdur 30mg  daily - SLN prn - tylenol for headache - cont ASA 81mg  daily - cont crestor 20mg  daily - will treat with ASA 325mg  x1 and heparin drip for UA if she develops recurrent chest pain  #) HTN: - cont home amlodipine, lasix  Severity of Illness: The appropriate patient status for this patient is INPATIENT. Inpatient status is  judged to be reasonable and necessary in order to provide the required intensity of service to ensure the patient's safety. The patient's presenting symptoms, physical exam findings, and initial radiographic and laboratory data in the context of their chronic comorbidities is felt to place them at high risk for further clinical deterioration. Furthermore, it is not anticipated that the patient will be medically stable for discharge from the hospital within 2 midnights of admission. The following factors support the patient status of inpatient.   " The patient's presenting symptoms include chest pain. " The worrisome physical exam findings include obesity, systolic murmur. " The initial radiographic and laboratory data are worrisome because of conduction disease on ECG. " The chronic co-morbidities include HTN, HLD, obesity.   * I certify that at the point of admission it is my clinical judgment that the patient will require inpatient hospital care spanning beyond 2 midnights from the point of admission due to high intensity of service, high risk for further deterioration and high frequency of surveillance required.*    For questions or updates, please contact [facility] Please consult [email or web address] for contact info under        Signed, Marcie Mowers, MD  06/24/2019 10:09 PM

## 2019-06-24 NOTE — ED Provider Notes (Signed)
[facility]C CV PROGRESSIVE CARE Provider Note   CSN: [id number] Arrival date & time: 06/24/19  1613     History Chief Complaint  Patient presents with  . Chest Pain    Karen Baker is a 69 y.o. female.  HPI    69 year old comes in a chief complaint of chest pain. Patient has history of diabetes, hypertension, hyperlipidemia.  She reports that she had abnormal cardiac work-up and was started on nitroglycerin for her chest pain by her cardiologist.  Today after lunch she started having left-sided severe chest pain with associated shortness of breath.  Patient took 2 nitros and her pain alleviated but has not completely resolved.  Currently she is having 1 out of 10 pain.  Past Medical History:  Diagnosis Date  . Anxiety    takes Xanax daily  . Arthritis   . Constipation    takes Colace daily  . GERD (gastroesophageal reflux disease)    takes Omeprazole daily  . History of blood transfusion    no abnormal reaction noted  . History of bronchitis 3-49yrs ago  . Hyperlipidemia    takes Lovastatin daily  . Hypertension    takes Amlodipine daily  . Joint pain   . Joint swelling   . 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 Primary osteoarthritis of left hip 05/30/2014  . Urinary frequency   . Urinary urgency     Patient Active Problem List   Diagnosis Date Noted  . Chest pain 06/24/2019  . Essential hypertension 04/29/2019  . Mixed hyperlipidemia 04/29/2019  . Coronary artery disease involving native coronary artery of native heart with angina pectoris ([place]) 04/29/2019  . 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 Primary osteoarthritis of left hip 05/30/2014    Past Surgical History:  Procedure Laterality Date  . ABDOMINAL HYSTERECTOMY  1972  . APPENDECTOMY  1969  . COLONOSCOPY    . cyst removed from ovary    . knee athroscopy Right   . knots removed from neck    . TONSILLECTOMY  1956  . TOTAL HIP ARTHROPLASTY Left 05/30/2014   Procedure: LEFT TOTAL HIP ARTHROPLASTY ANTERIOR  APPROACH;  Surgeon: Hessie Dibble, MD;  Location: [place];  Service: Orthopedics;  Laterality: Left;  . tumors removed from arches in feet Bilateral      OB History   No obstetric history on file.     Family History  Problem Relation Age of Onset  . Hypertension Mother   . Stroke Mother   . Cancer Sister   . Hypertension Brother     Social History   Tobacco Use  . Smoking status: Former Smoker    Packs/day: 1.00    Years: 20.00    Pack years: 20.00    Types: Cigarettes    Quit date: 02/17/2004    Years since quitting: 15.3  . Smokeless tobacco: Never Used  . Tobacco comment: no cigs in over a week  Substance Use Topics  . Alcohol use: No  . Drug use: No    Home Medications Prior to Admission medications   Medication Sig Start Date End Date Taking? Authorizing Provider  amLODipine (NORVASC) 5 MG tablet Take 5 mg by mouth daily.   Yes [provider]  aspirin EC 81 MG tablet Take 1 tablet (81 mg total) by mouth daily. 04/29/19  Yes Tobb, Kardie, DO  furosemide (LASIX) 20 MG tablet Take 40 mg by mouth daily as needed for fluid.    Yes [provider]  isosorbide mononitrate (  IMDUR) 30 MG 24 hr tablet Take 1 tablet (30 mg total) by mouth daily. 04/29/19 07/28/19 Yes Tobb, Kardie, DO  nitroGLYCERIN (NITROSTAT) 0.4 MG SL tablet Place 1 tablet (0.4 mg total) under the tongue every 5 (five) minutes as needed. 02/18/19 06/24/19 Yes Tobb, Kardie, DO  omeprazole (PRILOSEC) 20 MG capsule Take 20 mg by mouth daily.   Yes [provider]  rosuvastatin (CRESTOR) 20 MG tablet Take 1 tablet (20 mg total) by mouth daily. 04/29/19 07/28/19 Yes Tobb, Kardie, DO    Allergies    Azithromycin  Review of Systems   Review of Systems  Constitutional: Positive for activity change.  Respiratory: Positive for shortness of breath.   Cardiovascular: Positive for chest pain.  Gastrointestinal: Negative for nausea and vomiting.  Allergic/Immunologic: Negative for  immunocompromised state.  Neurological: Negative for weakness.  All other systems reviewed and are negative.   Physical Exam Updated Vital Signs BP 129/77   Pulse 85   Temp 100 F (37.8 C) (Oral)   Resp (!) 23   SpO2 96%   Physical Exam Vitals and nursing note reviewed.  Constitutional:      Appearance: She is well-developed.  HENT:     Head: Normocephalic and atraumatic.  Cardiovascular:     Rate and Rhythm: Normal rate.  Pulmonary:     Effort: Pulmonary effort is normal.  Abdominal:     General: Bowel sounds are normal.  Musculoskeletal:     Cervical back: Normal range of motion and neck supple.  Skin:    General: Skin is warm and dry.  Neurological:     Mental Status: She is alert and oriented to person, place, and time.     ED Results / Procedures / Treatments   Labs (all labs ordered are listed, but only abnormal results are displayed) Labs Reviewed  BASIC METABOLIC PANEL - Abnormal; Notable for the following components:      Result Value   Sodium 134 (*)    Potassium 3.0 (*)    Glucose, Bld 157 (*)    GFR calc non Af Amer 58 (*)    All other components within normal limits  CBC WITH DIFFERENTIAL/PLATELET - Abnormal; Notable for the following components:   Hemoglobin 10.6 (*)    HCT 32.2 (*)    All other components within normal limits  CBG MONITORING, ED - Abnormal; Notable for the following components:   Glucose-Capillary 153 (*)    All other components within normal limits  SARS CORONAVIRUS 2 (TAT 6-24 HRS)  TROPONIN I (HIGH SENSITIVITY)  TROPONIN I (HIGH SENSITIVITY)    EKG None ED ECG REPORT   Date: 06/24/2019  Rate: 95  Rhythm: normal sinus rhythm  QRS Axis: normal  Intervals: normal  ST/T Wave abnormalities: nonspecific ST/T changes  Conduction Disutrbances:right bundle branch block and left anterior fascicular block  Narrative Interpretation:   Old EKG Reviewed: unchanged  I have personally reviewed the EKG tracing and agree with  the computerized printout as noted.  Radiology DG Chest Portable 1 View  Result Date: 06/24/2019 CLINICAL DATA:  Chest pain. EXAM: PORTABLE CHEST 1 VIEW COMPARISON:  05/26/2016 FINDINGS: The heart size is stable from prior study. Aortic calcifications are noted. There is no pneumothorax. No large pleural effusion. No focal infiltrate. No acute osseous abnormality. IMPRESSION: No active disease. Electronically Signed   By: Constance Holster M.D.   On: 06/24/2019 18:31    Procedures .Critical Care Performed by: Varney Biles, MD Authorized by: Varney Biles, MD  Critical care provider statement:    Critical care time (minutes):  32   Critical care was necessary to treat or prevent imminent or life-threatening deterioration of the following conditions:  Cardiac failure   Critical care was time spent personally by me on the following activities:  Discussions with consultants, evaluation of patient's response to treatment, examination of patient, ordering and performing treatments and interventions, ordering and review of laboratory studies, ordering and review of radiographic studies, pulse oximetry, re-evaluation of patient's condition, obtaining history from patient or surrogate and review of old charts   (including critical care time)  Medications Ordered in ED Medications  nitroGLYCERIN (NITROGLYN) 2 % ointment 1 inch (1 inch Topical Given 06/24/19 1746)  acetaminophen (TYLENOL) tablet 650 mg (650 mg Oral Given 06/24/19 2230)    ED Course  I have reviewed the triage vital signs and the nursing notes.  Pertinent labs & imaging results that were available during my care of the patient were reviewed by me and considered in my medical decision making (see chart for details).    MDM Rules/Calculators/A&P                      69 year old comes in a chief complaint of chest pain. She is complaining of 1 out of 10 chest pain right now, but after lunch she started having severe  left-sided chest pain with shortness of breath.  She has taken nitroglycerin and that has alleviated her pain.  She reports that she was prescribed nitroglycerin for her angina and is supposed to get outpatient work-up.  She called her cardiologist who recommended that she come to the ER.  It appears that patient has had abnormal cardiac work-up -may be a coronary CT in the recent past.  EKG is unchanged.  Troponins are flat and below the institutional cutoff.  I discussed the case with cardiology team and they would like to admit the patient.  They do not want heparin to be started right now.  They will see the patient and initiate appropriate therapy.  Patient is comfortable with the plan.  Final Clinical Impression(s) / ED Diagnoses Final diagnoses:  Unstable angina [facility])    Rx / DC Orders ED Discharge Orders    None       Varney Biles, MD 06/24/19 2324

## 2019-06-24 NOTE — ED Triage Notes (Signed)
Pt reported that she began to have chest pain al across her chest, it felt like pressure & rated it 10/10. She took two Nitro's that did help before she went to Urgent Care. The Dr at urgent care saw she had an abnormal EKG so she was transported here via EMS. Pt arrived A/OX4, CP rated at a 1/10.

## 2019-06-25 ENCOUNTER — Inpatient Hospital Stay (HOSPITAL_COMMUNITY): Payer: Medicare PPO

## 2019-06-25 DIAGNOSIS — I2510 Atherosclerotic heart disease of native coronary artery without angina pectoris: Secondary | ICD-10-CM

## 2019-06-25 DIAGNOSIS — E118 Type 2 diabetes mellitus with unspecified complications: Secondary | ICD-10-CM

## 2019-06-25 DIAGNOSIS — I200 Unstable angina: Secondary | ICD-10-CM

## 2019-06-25 HISTORY — DX: Type 2 diabetes mellitus with unspecified complications: E11.8

## 2019-06-25 HISTORY — DX: Unstable angina: I20.0

## 2019-06-25 LAB — CBC
HCT: 30.5 % — ABNORMAL LOW (ref 36.0–46.0)
Hemoglobin: 10.1 g/dL — ABNORMAL LOW (ref 12.0–15.0)
MCH: 27.1 pg (ref 26.0–34.0)
MCHC: 33.1 g/dL (ref 30.0–36.0)
MCV: 81.8 fL (ref 80.0–100.0)
Platelets: 198 10*3/uL (ref 150–400)
RBC: 3.73 MIL/uL — ABNORMAL LOW (ref 3.87–5.11)
RDW: 13.8 % (ref 11.5–15.5)
WBC: 6.1 10*3/uL (ref 4.0–10.5)
nRBC: 0 % (ref 0.0–0.2)

## 2019-06-25 LAB — BASIC METABOLIC PANEL
Anion gap: 11 (ref 5–15)
BUN: 11 mg/dL (ref 8–23)
CO2: 26 mmol/L (ref 22–32)
Calcium: 10.3 mg/dL (ref 8.9–10.3)
Chloride: 98 mmol/L (ref 98–111)
Creatinine, Ser: 0.99 mg/dL (ref 0.44–1.00)
GFR calc Af Amer: >60 mL/min (ref >60)
GFR calc non Af Amer: 58 mL/min — ABNORMAL LOW (ref >60)
Glucose, Bld: 132 mg/dL — ABNORMAL HIGH (ref 70–99)
Potassium: 2.8 mmol/L — ABNORMAL LOW (ref 3.5–5.1)
Sodium: 135 mmol/L (ref 135–145)

## 2019-06-25 LAB — HEPARIN LEVEL (UNFRACTIONATED): Heparin Unfractionated: 0.54 IU/mL (ref 0.30–0.70)

## 2019-06-25 LAB — ECHOCARDIOGRAM COMPLETE
Height: 65 in
Weight: 2966.51 oz

## 2019-06-25 LAB — SARS CORONAVIRUS 2 (TAT 6-24 HRS): SARS Coronavirus 2: NEGATIVE

## 2019-06-25 LAB — LIPID PANEL
Cholesterol: 96 mg/dL (ref 0–200)
HDL: 32 mg/dL — ABNORMAL LOW (ref >40)
LDL Cholesterol: 42 mg/dL (ref 0–99)
Total CHOL/HDL Ratio: 3 RATIO
Triglycerides: 112 mg/dL (ref <150)
VLDL: 22 mg/dL (ref 0–40)

## 2019-06-25 LAB — GLUCOSE, CAPILLARY
Glucose-Capillary: 112 mg/dL — ABNORMAL HIGH (ref 70–99)
Glucose-Capillary: 114 mg/dL — ABNORMAL HIGH (ref 70–99)

## 2019-06-25 LAB — MRSA PCR SCREENING: MRSA by PCR: NEGATIVE

## 2019-06-25 LAB — HEMOGLOBIN A1C
Hgb A1c MFr Bld: 6.7 % — ABNORMAL HIGH (ref 4.8–5.6)
Mean Plasma Glucose: 145.59 mg/dL

## 2019-06-25 LAB — HIV ANTIBODY (ROUTINE TESTING W REFLEX): HIV Screen 4th Generation wRfx: NONREACTIVE

## 2019-06-25 MED ORDER — ENSURE ENLIVE PO LIQD
237.0000 mL | Freq: Two times a day (BID) | ORAL | Status: DC
  Administered 2019-06-25 – 2019-06-27 (×3): 237 mL via ORAL

## 2019-06-25 MED ORDER — NITROGLYCERIN 0.4 MG SL SUBL: 0.4000 mg | SUBLINGUAL_TABLET | SUBLINGUAL | Status: DC | PRN

## 2019-06-25 MED ORDER — ASPIRIN EC 81 MG PO TBEC
81.0000 mg | DELAYED_RELEASE_TABLET | Freq: Every day | ORAL | Status: DC
  Administered 2019-06-25 – 2019-06-28 (×3): 81 mg via ORAL
  Filled 2019-06-25 (×3): qty 1

## 2019-06-25 MED ORDER — INSULIN ASPART 100 UNIT/ML ~~LOC~~ SOLN: 0.0000 [IU] | Freq: Every day | SUBCUTANEOUS | Status: DC

## 2019-06-25 MED ORDER — AMLODIPINE BESYLATE 5 MG PO TABS
5.0000 mg | ORAL_TABLET | Freq: Every day | ORAL | Status: DC
  Administered 2019-06-25: 5 mg via ORAL
  Filled 2019-06-25: qty 1

## 2019-06-25 MED ORDER — ONDANSETRON HCL 4 MG/2ML IJ SOLN: 4.0000 mg | Freq: Four times a day (QID) | INTRAMUSCULAR | Status: DC | PRN

## 2019-06-25 MED ORDER — POTASSIUM CHLORIDE CRYS ER 10 MEQ PO TBCR
40.0000 meq | EXTENDED_RELEASE_TABLET | Freq: Once | ORAL | Status: AC
  Administered 2019-06-25: 40 meq via ORAL
  Filled 2019-06-25: qty 4

## 2019-06-25 MED ORDER — HEPARIN (PORCINE) 25000 UT/250ML-% IV SOLN
1200.0000 [IU]/h | INTRAVENOUS | Status: DC
  Administered 2019-06-25: 1050 [IU]/h via INTRAVENOUS
  Filled 2019-06-25 (×2): qty 250

## 2019-06-25 MED ORDER — ROSUVASTATIN CALCIUM 20 MG PO TABS
40.0000 mg | ORAL_TABLET | Freq: Every day | ORAL | Status: DC
  Administered 2019-06-26 – 2019-06-28 (×3): 40 mg via ORAL
  Filled 2019-06-25 (×3): qty 2

## 2019-06-25 MED ORDER — ISOSORBIDE MONONITRATE ER 30 MG PO TB24
30.0000 mg | ORAL_TABLET | Freq: Every day | ORAL | Status: DC
  Administered 2019-06-25 – 2019-06-28 (×4): 30 mg via ORAL
  Filled 2019-06-25 (×4): qty 1

## 2019-06-25 MED ORDER — FUROSEMIDE 20 MG PO TABS: 20.0000 mg | ORAL_TABLET | Freq: Every day | ORAL | Status: DC

## 2019-06-25 MED ORDER — ROSUVASTATIN CALCIUM 20 MG PO TABS
20.0000 mg | ORAL_TABLET | Freq: Every day | ORAL | Status: DC
  Administered 2019-06-25: 20 mg via ORAL
  Filled 2019-06-25: qty 1

## 2019-06-25 MED ORDER — ENOXAPARIN SODIUM 40 MG/0.4ML ~~LOC~~ SOLN
40.0000 mg | SUBCUTANEOUS | Status: DC
  Administered 2019-06-25: 40 mg via SUBCUTANEOUS
  Filled 2019-06-25: qty 0.4

## 2019-06-25 MED ORDER — PANTOPRAZOLE SODIUM 40 MG PO TBEC
40.0000 mg | DELAYED_RELEASE_TABLET | Freq: Every day | ORAL | Status: DC
  Administered 2019-06-25 – 2019-06-28 (×4): 40 mg via ORAL
  Filled 2019-06-25 (×4): qty 1

## 2019-06-25 MED ORDER — INSULIN ASPART 100 UNIT/ML ~~LOC~~ SOLN
0.0000 [IU] | Freq: Three times a day (TID) | SUBCUTANEOUS | Status: DC
  Administered 2019-06-27 – 2019-06-28 (×2): 2 [IU] via SUBCUTANEOUS

## 2019-06-25 MED ORDER — METOPROLOL TARTRATE 12.5 MG HALF TABLET
12.5000 mg | ORAL_TABLET | Freq: Four times a day (QID) | ORAL | Status: DC
  Administered 2019-06-25 – 2019-06-28 (×12): 12.5 mg via ORAL
  Filled 2019-06-25 (×13): qty 1

## 2019-06-25 MED ORDER — FUROSEMIDE 40 MG PO TABS: 40.0000 mg | ORAL_TABLET | Freq: Two times a day (BID) | ORAL | Status: DC

## 2019-06-25 MED ORDER — FUROSEMIDE 40 MG PO TABS
40.0000 mg | ORAL_TABLET | Freq: Every day | ORAL | Status: DC
  Administered 2019-06-25: 40 mg via ORAL
  Filled 2019-06-25: qty 1

## 2019-06-25 NOTE — Progress Notes (Signed)
ANTICOAGULATION CONSULT NOTE - Initial Consult  Pharmacy Consult for heparin Indication: chest pain/ACS  Allergies  Allergen Reactions  . Azithromycin Hives    Patient Measurements: Height: 5\' 5"  (165.1 cm) Weight: 185 lb 6.5 oz (84.1 kg) IBW/kg (Calculated) : 57 Heparin Dosing Weight: 75  Vital Signs: Temp: 98.3 F (36.8 C) (03/27 0800) Temp Source: Oral (03/27 0800) BP: 128/68 (03/27 0800) Pulse Rate: 65 (03/27 0800)  Labs: Recent Labs    06/24/19 1725 06/24/19 1925 06/25/19 0037  HGB 10.6*  --  10.1*  HCT 32.2*  --  30.5*  PLT 219  --  198  CREATININE 0.99  --  0.99  TROPONINIHS 11 12  --     Estimated Creatinine Clearance: 56.6 mL/min (by C-G formula based on SCr of 0.99 mg/dL).   Medical History: Past Medical History:  Diagnosis Date  . Anxiety    takes Xanax daily  . Arthritis   . Constipation    takes Colace daily  . GERD (gastroesophageal reflux disease)    takes Omeprazole daily  . History of blood transfusion    no abnormal reaction noted  . History of bronchitis 3-63yrs ago  . Hyperlipidemia    takes Lovastatin daily  . Hypertension    takes Amlodipine daily  . Joint pain   . Joint swelling   . 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 Primary osteoarthritis of left hip 05/30/2014  . Urinary frequency   . Urinary urgency    Assessment: 69 year old female with likely unstable angina, new orders received to start IV heparin. Possible cath on Monday.   Hgb slightly low at 10 but stable overnight. plt wnl. No bleeding issues noted, received enoxaparin this am for px will not bolus.   Goal of Therapy:  Heparin level 0.3-0.7 units/ml Monitor platelets by anticoagulation protocol: Yes   Plan:  Start heparin infusion at 1050 units/hr Check anti-Xa level in 8 hours and daily while on heparin Continue to monitor H&H and platelets  Erin Hearing PharmD., BCPS Clinical Pharmacist 06/25/2019 10:57 AM

## 2019-06-25 NOTE — Discharge Instructions (Signed)

## 2019-06-25 NOTE — Progress Notes (Signed)
[place] for heparin Indication: chest pain/ACS  Allergies  Allergen Reactions  . Azithromycin Hives    Patient Measurements: Height: 5\' 5"  (165.1 cm) Weight: 185 lb 6.5 oz (84.1 kg) IBW/kg (Calculated) : 57 Heparin Dosing Weight: 75  Vital Signs: Temp: 99.3 F (37.4 C) (03/27 2005) Temp Source: Oral (03/27 2005) BP: 118/70 (03/27 2005) Pulse Rate: 62 (03/27 2005)  Labs: Recent Labs    06/24/19 1725 06/24/19 1925 06/25/19 0037 06/25/19 2015  HGB 10.6*  --  10.1*  --   HCT 32.2*  --  30.5*  --   PLT 219  --  198  --   HEPARINUNFRC  --   --   --  0.54  CREATININE 0.99  --  0.99  --   TROPONINIHS 11 12  --   --     Estimated Creatinine Clearance: 56.6 mL/min (by C-G formula based on SCr of 0.99 mg/dL).   Medical History: Past Medical History:  Diagnosis Date  . Anxiety    takes Xanax daily  . Arthritis   . Constipation    takes Colace daily  . GERD (gastroesophageal reflux disease)    takes Omeprazole daily  . History of blood transfusion    no abnormal reaction noted  . History of bronchitis 3-60yrs ago  . Hyperlipidemia    takes Lovastatin daily  . Hypertension    takes Amlodipine daily  . Joint pain   . Joint swelling   . 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 Primary osteoarthritis of left hip 05/30/2014  . Urinary frequency   . Urinary urgency    Assessment: 69 year old female with likely unstable angina, new orders received to start IV heparin. Possible cath on Monday.  -heparin level at goal  Goal of Therapy:  Heparin level 0.3-0.7 units/ml Monitor platelets by anticoagulation protocol: Yes   Plan:  -no heparin changes needed -Daily heparin level and CBC  Hildred Laser, PharmD Clinical Pharmacist **Pharmacist phone directory can now be found on [email or web address] ([email or web address]).  Listed under [place].

## 2019-06-25 NOTE — Plan of Care (Signed)

## 2019-06-25 NOTE — Progress Notes (Signed)
  Echocardiogram 2D Echocardiogram has been performed.  Michiel Cowboy 06/25/2019, 11:41 AM

## 2019-06-25 NOTE — Progress Notes (Signed)
Initial Nutrition Assessment  DOCUMENTATION CODES:   Obesity unspecified  INTERVENTION:  Continue Ensure Enlive po BID, each supplement provides 350 kcal and 20 grams of protein  MVI with minerals daily  Heart Healthy Consistent Carbohydate Diet education  NUTRITION DIAGNOSIS:   Increased nutrient needs related to acute illness(unstable angina) as evidenced by estimated needs.  GOAL:   Patient will meet greater than or equal to 90% of their needs  MONITOR:   I & O's, Labs, Supplement acceptance, PO intake, Weight trends  REASON FOR ASSESSMENT:   Malnutrition Screening Tool    ASSESSMENT:   RD working remotely.    69 year old female with past medical history of DM, GERD, constipation (takes Colace daily) HLD, and CAD presented with severe left sided chest pain with associated shortness of breath s/p 2 nitros without complete resolve and admitted on 3/26 for unstable angina.  Spoke with patient via phone this afternoon, she reports feeling much better today. Patient reports eating a "light diet" at home. She recalls having a glass of milk and piece of toast for breakfast. Her and her husband will  have either a heavier lunch or dinner and eat lighter on the other meal. She recalls chicken, hamburgers, and states that she loves her fruits and vegetables. RD educated on a heart healthy consistent carbohydrate diet, encouraged whole grains and fruit/vegetable consumption for increased fiber, choosing lean protein sources and discussed ways to decrease sodium intake. Diet education handout attached to discharge instructions for patient review.  Patient is on a HH diet eating 75% x 1 documented meal this admission and is recieving Ensure BID per medication review. Will continue to monitor po intake of meals and supplements.   Per notes: -troponins negative, EKG unremarkable -2D echo results pending -tentative plan for L heart cath on Monday -increase Crestor to 40 mg -EF 60-65%  per 04/15/19 TEE -Severe CAD in proximal PDA per 04/27/19 CCTA  Non-pitting BLE edema noted per RN assessment. Current wt 185 lbs UBW 185 lbs per pt report Weight history reviewed, on 02/10/19 pt wt 198.66 lbs, on 02/18/19 pt wt 197.56 lbs, on 04/29/19 pt wt 190.52 lbs, on 05/12/19 pt wt 188.1 lbs. This indicates an ~14 lb (6.9%) wt loss over the past 4 months which is insignificant for time frame.  Medications reviewed and include: SSI, Imdur, Crestor IV Heparin 10.5 ml/hr  Labs: CBGs 112-153 x 24 hrs, K 2.8 (L) Lab Results  Component Value Date   HGBA1C 6.7 (H) 06/25/2019     NUTRITION - FOCUSED PHYSICAL EXAM: Unable to complete at this time, RD working remotely.   Diet Order:   Diet Order            Diet Heart Room service appropriate? Yes; Fluid consistency: Thin  Diet effective now              EDUCATION NEEDS:   No education needs have been identified at this time  Skin:  Skin Assessment: Reviewed RN Assessment  Last BM:  3/25  Height:   Ht Readings from Last 1 Encounters:  06/24/19 5\' 5"  (1.651 m)    Weight:   Wt Readings from Last 1 Encounters:  06/24/19 84.1 kg    Ideal Body Weight:  56.8 kg  BMI:  Body mass index is 30.85 kg/m.  Estimated Nutritional Needs:   Kcal:  [id number]  Protein:  85-98  Fluid:  >/= 1.7 L/day   Lajuan Lines, RD, LDN Clinical Nutrition After Hours/Weekend Pager # in  Amion

## 2019-06-25 NOTE — Progress Notes (Signed)
Cardiology Progress Note  Patient ID: Karen Baker MRN: [id number] DOB: May 03, 1950 Date of Encounter: 06/25/2019  Primary Cardiologist: Berniece Salines, DO  Subjective  Denies chest pain this morning.  Troponins are negative.  EKG unremarkable.  A1c 6.7.  ROS:  All other ROS reviewed and negative. Pertinent positives noted in the HPI.     Inpatient Medications  Scheduled Meds: . amLODipine  5 mg Oral Daily  . aspirin EC  81 mg Oral Daily  . enoxaparin (LOVENOX) injection  40 mg Subcutaneous Q24H  . feeding supplement (ENSURE ENLIVE)  237 mL Oral BID BM  . isosorbide mononitrate  30 mg Oral Daily  . metoprolol tartrate  12.5 mg Oral Q6H  . nitroGLYCERIN  1 inch Topical Q6H  . pantoprazole  40 mg Oral Daily  . rosuvastatin  20 mg Oral Daily   Continuous Infusions:  PRN Meds: acetaminophen, nitroGLYCERIN, ondansetron (ZOFRAN) IV   Vital Signs   Vitals:   06/25/19 0030 06/25/19 0315 06/25/19 0622 06/25/19 0800  BP: 110/62 128/72 93/76 128/68  Pulse: 78 68 (!) 59 65  Resp:  15  20  Temp:  98.6 F (37 C)  98.3 F (36.8 C)  TempSrc:  Oral  Oral  SpO2:  98%  99%  Weight:      Height:        Intake/Output Summary (Last 24 hours) at 06/25/2019 1042 Last data filed at 06/25/2019 0800 Gross per 24 hour  Intake 240 ml  Output --  Net 240 ml   Last 3 Weights 06/24/2019 05/12/2019 04/29/2019  Weight (lbs) 185 lb 6.5 oz 188 lb 8 oz 191 lb  Weight (kg) 84.1 kg 85.503 kg 86.637 kg      Telemetry  Overnight telemetry shows normal sinus rhythm without arrhythmias, which I personally reviewed.   ECG  The most recent ECG shows normal sinus rhythm, right bundle branch block, left anterior fascicular block, normal PR interval, which I personally reviewed.   Physical Exam   Vitals:   06/25/19 0030 06/25/19 0315 06/25/19 0622 06/25/19 0800  BP: 110/62 128/72 93/76 128/68  Pulse: 78 68 (!) 59 65  Resp:  15  20  Temp:  98.6 F (37 C)  98.3 F (36.8 C)  TempSrc:  Oral  Oral   SpO2:  98%  99%  Weight:      Height:         Intake/Output Summary (Last 24 hours) at 06/25/2019 1042 Last data filed at 06/25/2019 0800 Gross per 24 hour  Intake 240 ml  Output --  Net 240 ml    Last 3 Weights 06/24/2019 05/12/2019 04/29/2019  Weight (lbs) 185 lb 6.5 oz 188 lb 8 oz 191 lb  Weight (kg) 84.1 kg 85.503 kg 86.637 kg    Body mass index is 30.85 kg/m.  General: Well nourished, well developed, in no acute distress Head: Atraumatic, normal size  Eyes: PEERLA, EOMI  Neck: Supple, no JVD Endocrine: No thryomegaly Cardiac: Normal S1, S2; RRR; no murmurs, rubs, or gallops Lungs: Clear to auscultation bilaterally, no wheezing, rhonchi or rales  Abd: Soft, nontender, no hepatomegaly  Ext: No edema, pulses 2+ Musculoskeletal: No deformities, BUE and BLE strength normal and equal Skin: Warm and dry, no rashes   Neuro: Alert and oriented to person, place, time, and situation, CNII-XII grossly intact, no focal deficits  Psych: Normal mood and affect   Labs  High Sensitivity Troponin:   Recent Labs  Lab 06/24/19 1725 06/24/19 1925  TROPONINIHS 11  12     Cardiac EnzymesNo results for input(s): TROPONINI in the last 168 hours. No results for input(s): TROPIPOC in the last 168 hours.  Chemistry Recent Labs  Lab 06/24/19 1725 06/25/19 0037  NA 134* 135  K 3.0* 2.8*  CL 98 98  CO2 27 26  GLUCOSE 157* 132*  BUN 13 11  CREATININE 0.99 0.99  CALCIUM 10.3 10.3  GFRNONAA 58* 58*  GFRAA >60 >60  ANIONGAP 9 11    Hematology Recent Labs  Lab 06/24/19 1725 06/25/19 0037  WBC 7.9 6.1  RBC 3.91 3.73*  HGB 10.6* 10.1*  HCT 32.2* 30.5*  MCV 82.4 81.8  MCH 27.1 27.1  MCHC 32.9 33.1  RDW 13.7 13.8  PLT 219 198   BNPNo results for input(s): BNP, PROBNP in the last 168 hours.  DDimer No results for input(s): DDIMER in the last 168 hours.   Radiology  DG Chest Portable 1 View  Result Date: 06/24/2019 CLINICAL DATA:  Chest pain. EXAM: PORTABLE CHEST 1 VIEW  COMPARISON:  05/26/2016 FINDINGS: The heart size is stable from prior study. Aortic calcifications are noted. There is no pneumothorax. No large pleural effusion. No focal infiltrate. No acute osseous abnormality. IMPRESSION: No active disease. Electronically Signed   By: Constance Holster M.D.   On: 06/24/2019 18:31    Cardiac Studies  TTE 04/15/2019 1. Left ventricular ejection fraction, by visual estimation, is 60 to  65%. The left ventricle has normal function. There is no left ventricular  hypertrophy.  2. Left ventricular diastolic parameters are consistent with Grade I  diastolic dysfunction (impaired relaxation).  3. Left atrial size was normal.  4. Right atrial size was normal.  5. The mitral valve is normal in structure. No evidence of mitral valve  regurgitation. No evidence of mitral stenosis.  6. The tricuspid valve is normal in structure.  7. The aortic valve is normal in structure. Aortic valve regurgitation is  not visualized. No evidence of aortic valve sclerosis or stenosis.  8. The pulmonic valve was normal in structure. Pulmonic valve  regurgitation is not visualized.  9. The left ventricle has no regional wall motion abnormalities.   CCTA 04/27/2019 IMPRESSION: 1. 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 Moderate stenosis in the mid LAD, ostial first diagonal, and distal left circumflex. CT FFR will be performed and reported separately.  2. The patient's coronary artery calcium score is 1003, which places the patient in the 97th percentile.  3. Normal coronary origin with left dominance.  IMPRESSION: 1. CT FFR showed hemodynamically significant lesion in the proximal PDA arising from the large, dominant left circumflex artery. PDA measures 2.3 mm proximal to the stenosis and 2 mm distal to the stenosis.    Patient Profile  Karen Baker is a 69 y.o. female with CAD, hypertension, hyperlipidemia who was admitted on 06/24/2019 with worsening  chest pain concerning for unstable angina.  She has no CAD based on her recent cardiac CTA.  Assessment & Plan  1.  Unstable angina -She developed symptoms at rest yesterday that responded nitroglycerin immediately.  She took up to 2-3 nitros and pain was still there.  Evaluate the emergency room here and EKG shows no acute ST-T changes.  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  Troponins are flat at 12. -Given that her symptoms developed at rest and her known cardiac CTA which demonstrated moderate disease in a diagonal branch as well as  severe stenosis in the left PDA, I suspect this is unstable angina.  She was given aspirin 325 yesterday.  We will continue 81 mg daily -We will continue metoprolol 12.5 every 6 hours -She remains on home Imdur 30 mg daily -We will going start a heparin drip -Should she develop further chest pain she will likely need a nitroglycerin drip.  I have discontinue her nitroglycerin paste. -We will increase her Crestor to 40 mg daily -We will tentatively plan for left heart catheterization on Monday.  Should she develop worsening symptoms we cannot control nitroglycerin she may need to go sooner.  We will see how she does.  2.  Diabetes -A1c 6.7 -We will need Metformin at discharge -Sliding scale insulin house  3.  Hypokalemia -We will hold Lasix.  No volume on her today.  Repletion ordered.  4. Hyperlipidemia -Continue home Crestor  For questions or updates, please contact [place] Please consult [email or web address] for contact info under   FEN -no IVF -diet: heart healthy -dvt ppx: heparin drip -code: full -Dispo: pending LHC Monday   Time Spent with Patient: I have spent a total of 35 minutes with patient reviewing hospital notes, telemetry, EKGs, labs and examining the patient as well as establishing an assessment and plan that was discussed with the patient.  > 50% of time was spent in direct patient  care.    Signed, Addison Naegeli. Audie Box, [place]  06/25/2019 10:42 AM

## 2019-06-26 LAB — GLUCOSE, CAPILLARY
Glucose-Capillary: 103 mg/dL — ABNORMAL HIGH (ref 70–99)
Glucose-Capillary: 108 mg/dL — ABNORMAL HIGH (ref 70–99)
Glucose-Capillary: 101 mg/dL — ABNORMAL HIGH (ref 70–99)
Glucose-Capillary: 136 mg/dL — ABNORMAL HIGH (ref 70–99)

## 2019-06-26 LAB — CBC
HCT: 32.1 % — ABNORMAL LOW (ref 36.0–46.0)
Hemoglobin: 10.4 g/dL — ABNORMAL LOW (ref 12.0–15.0)
MCH: 26.8 pg (ref 26.0–34.0)
MCHC: 32.4 g/dL (ref 30.0–36.0)
MCV: 82.7 fL (ref 80.0–100.0)
Platelets: 211 10*3/uL (ref 150–400)
RBC: 3.88 MIL/uL (ref 3.87–5.11)
RDW: 14.3 % (ref 11.5–15.5)
WBC: 7.7 10*3/uL (ref 4.0–10.5)
nRBC: 0 % (ref 0.0–0.2)

## 2019-06-26 LAB — BASIC METABOLIC PANEL
Anion gap: 8 (ref 5–15)
BUN: 13 mg/dL (ref 8–23)
CO2: 27 mmol/L (ref 22–32)
Calcium: 10 mg/dL (ref 8.9–10.3)
Chloride: 103 mmol/L (ref 98–111)
Creatinine, Ser: 1.01 mg/dL — ABNORMAL HIGH (ref 0.44–1.00)
GFR calc Af Amer: >60 mL/min (ref >60)
GFR calc non Af Amer: 56 mL/min — ABNORMAL LOW (ref >60)
Glucose, Bld: 120 mg/dL — ABNORMAL HIGH (ref 70–99)
Potassium: 4 mmol/L (ref 3.5–5.1)
Sodium: 138 mmol/L (ref 135–145)

## 2019-06-26 LAB — HEPARIN LEVEL (UNFRACTIONATED): Heparin Unfractionated: 0.36 IU/mL (ref 0.30–0.70)

## 2019-06-26 MED ORDER — ALUM & MAG HYDROXIDE-SIMETH 200-200-20 MG/5ML PO SUSP
30.0000 mL | Freq: Once | ORAL | Status: AC
  Administered 2019-06-26: 30 mL via ORAL
  Filled 2019-06-26: qty 30

## 2019-06-26 MED ORDER — ALUM & MAG HYDROXIDE-SIMETH 200-200-20 MG/5ML PO SUSP
15.0000 mL | ORAL | Status: DC | PRN
  Administered 2019-06-26: 15 mL via ORAL
  Filled 2019-06-26: qty 30

## 2019-06-26 MED ORDER — LIDOCAINE VISCOUS HCL 2 % MT SOLN
15.0000 mL | Freq: Once | OROMUCOSAL | Status: AC
  Administered 2019-06-26: 15 mL via ORAL
  Filled 2019-06-26: qty 15

## 2019-06-26 NOTE — Progress Notes (Signed)
[place] for heparin Indication: chest pain/ACS  Allergies  Allergen Reactions  . Azithromycin Hives    Patient Measurements: Height: 5\' 5"  (165.1 cm) Weight: 185 lb 6.5 oz (84.1 kg) IBW/kg (Calculated) : 57 Heparin Dosing Weight: 75  Vital Signs: Temp: 98.8 F (37.1 C) (03/28 0749) Temp Source: Oral (03/28 0749) BP: 120/48 (03/28 0749) Pulse Rate: 61 (03/28 0749)  Labs: Recent Labs    06/24/19 1725 06/24/19 1725 06/24/19 1925 06/25/19 0037 06/25/19 2015 06/26/19 0305  HGB 10.6*   < >  --  10.1*  --  10.4*  HCT 32.2*  --   --  30.5*  --  32.1*  PLT 219  --   --  198  --  211  HEPARINUNFRC  --   --   --   --  0.54 0.36  CREATININE 0.99  --   --  0.99  --  1.01*  TROPONINIHS 11  --  12  --   --   --    < > = values in this interval not displayed.    Estimated Creatinine Clearance: 55.5 mL/min (A) (by C-G formula based on SCr of 1.01 mg/dL (H)).   Medical History: Past Medical History:  Diagnosis Date  . Anxiety    takes Xanax daily  . Arthritis   . Constipation    takes Colace daily  . GERD (gastroesophageal reflux disease)    takes Omeprazole daily  . History of blood transfusion    no abnormal reaction noted  . History of bronchitis 3-56yrs ago  . Hyperlipidemia    takes Lovastatin daily  . Hypertension    takes Amlodipine daily  . Joint pain   . Joint swelling   . 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 Primary osteoarthritis of left hip 05/30/2014  . Urinary frequency   . Urinary urgency    Assessment: 69 year old female with likely unstable angina, new orders received to start IV heparin. Possible cath on Monday.   Hgb stable in 10s, platelets wnl. No bleeding issues noted. Planning cath in am.  Goal of Therapy:  Heparin level 0.3-0.7 units/ml Monitor platelets by anticoagulation protocol: Yes   Plan:  Continue heparin infusion at 1050 units/hr Continue to monitor H&H and platelets  Erin Hearing PharmD.,  BCPS Clinical Pharmacist 06/26/2019 8:42 AM

## 2019-06-26 NOTE — Progress Notes (Signed)
Cardiology Progress Note  Patient ID: Karen Baker MRN: [id number] DOB: 1950-12-16 Date of Encounter: 06/26/2019  Primary Cardiologist: Berniece Salines, DO  Subjective  Denies chest pain today.  Telemetry unremarkable.  Ready to get her heart cath over with.  ROS:  All other ROS reviewed and negative. Pertinent positives noted in the HPI.     Inpatient Medications  Scheduled Meds: . aspirin EC  81 mg Oral Daily  . feeding supplement (ENSURE ENLIVE)  237 mL Oral BID BM  . insulin aspart  0-15 Units Subcutaneous TID WC  . insulin aspart  0-5 Units Subcutaneous QHS  . isosorbide mononitrate  30 mg Oral Daily  . metoprolol tartrate  12.5 mg Oral Q6H  . pantoprazole  40 mg Oral Daily  . rosuvastatin  40 mg Oral Daily   Continuous Infusions: . heparin 1,050 Units/hr (06/26/19 0752)   PRN Meds: acetaminophen, nitroGLYCERIN, ondansetron (ZOFRAN) IV   Vital Signs   Vitals:   06/25/19 2325 06/26/19 0058 06/26/19 0622 06/26/19 0749  BP: 117/64 119/67 108/60 (!) 120/48  Pulse: 67 65 62 61  Resp: 14  20 11   Temp: 99.4 F (37.4 C)  98.2 F (36.8 C) 98.8 F (37.1 C)  TempSrc: Oral  Oral Oral  SpO2: 98%  98% 99%  Weight:      Height:        Intake/Output Summary (Last 24 hours) at 06/26/2019 0840 Last data filed at 06/26/2019 0752 Gross per 24 hour  Intake 798.1 ml  Output --  Net 798.1 ml   Last 3 Weights 06/24/2019 05/12/2019 04/29/2019  Weight (lbs) 185 lb 6.5 oz 188 lb 8 oz 191 lb  Weight (kg) 84.1 kg 85.503 kg 86.637 kg      Telemetry  Overnight telemetry shows normal sinus rhythm without arrhythmias, which I personally reviewed.   ECG  The most recent ECG shows normal sinus rhythm, right bundle branch block, left anterior fascicular block, normal PR interval, which I personally reviewed.   Physical Exam   Vitals:   06/25/19 2325 06/26/19 0058 06/26/19 0622 06/26/19 0749  BP: 117/64 119/67 108/60 (!) 120/48  Pulse: 67 65 62 61  Resp: 14  20 11   Temp: 99.4 F (37.4  C)  98.2 F (36.8 C) 98.8 F (37.1 C)  TempSrc: Oral  Oral Oral  SpO2: 98%  98% 99%  Weight:      Height:         Intake/Output Summary (Last 24 hours) at 06/26/2019 0840 Last data filed at 06/26/2019 0752 Gross per 24 hour  Intake 798.1 ml  Output --  Net 798.1 ml    Last 3 Weights 06/24/2019 05/12/2019 04/29/2019  Weight (lbs) 185 lb 6.5 oz 188 lb 8 oz 191 lb  Weight (kg) 84.1 kg 85.503 kg 86.637 kg    Body mass index is 30.85 kg/m.  General: Well nourished, well developed, in no acute distress Head: Atraumatic, normal size  Eyes: PEERLA, EOMI  Neck: Supple, no JVD Endocrine: No thryomegaly Cardiac: Normal S1, S2; RRR; no murmurs, rubs, or gallops Lungs: Clear to auscultation bilaterally, no wheezing, rhonchi or rales  Abd: Soft, nontender, no hepatomegaly  Ext: No edema, pulses 2+ Musculoskeletal: No deformities, BUE and BLE strength normal and equal Skin: Warm and dry, no rashes   Neuro: Alert and oriented to person, place, time, and situation, CNII-XII grossly intact, no focal deficits  Psych: Normal mood and affect   Labs  High Sensitivity Troponin:   Recent Labs  Lab  06/24/19 1725 06/24/19 1925  TROPONINIHS 11 12     Cardiac EnzymesNo results for input(s): TROPONINI in the last 168 hours. No results for input(s): TROPIPOC in the last 168 hours.  Chemistry Recent Labs  Lab 06/24/19 1725 06/25/19 0037 06/26/19 0305  NA 134* 135 138  K 3.0* 2.8* 4.0  CL 98 98 103  CO2 27 26 27   GLUCOSE 157* 132* 120*  BUN 13 11 13   CREATININE 0.99 0.99 1.01*  CALCIUM 10.3 10.3 10.0  GFRNONAA 58* 58* 56*  GFRAA >60 >60 >60  ANIONGAP 9 11 8     Hematology Recent Labs  Lab 06/24/19 1725 06/25/19 0037 06/26/19 0305  WBC 7.9 6.1 7.7  RBC 3.91 3.73* 3.88  HGB 10.6* 10.1* 10.4*  HCT 32.2* 30.5* 32.1*  MCV 82.4 81.8 82.7  MCH 27.1 27.1 26.8  MCHC 32.9 33.1 32.4  RDW 13.7 13.8 14.3  PLT 219 198 211   BNPNo results for input(s): BNP, PROBNP in the last 168 hours.   DDimer No results for input(s): DDIMER in the last 168 hours.   Radiology  DG Chest Portable 1 View  Result Date: 06/24/2019 CLINICAL DATA:  Chest pain. EXAM: PORTABLE CHEST 1 VIEW COMPARISON:  05/26/2016 FINDINGS: The heart size is stable from prior study. Aortic calcifications are noted. There is no pneumothorax. No large pleural effusion. No focal infiltrate. No acute osseous abnormality. IMPRESSION: No active disease. Electronically Signed   By: Constance Holster M.D.   On: 06/24/2019 18:31   ECHOCARDIOGRAM COMPLETE  Result Date: 06/25/2019    ECHOCARDIOGRAM REPORT   Patient Name:   Karen Baker Date of Exam: 06/25/2019 Medical Rec #:  [id number]     Height:       65.0 in Accession #:    [id number]    Weight:       185.4 lb Date of Birth:  12-21-1950     BSA:          1.915 m Patient Age:    69 years      BP:           128/68 mmHg Patient Gender: F             HR:           66 bpm. Exam Location:  Inpatient Procedure: 2D Echo, Cardiac Doppler and Color Doppler Indications:    414.01/ I25.10  History:        Patient has prior history of Echocardiogram examinations, most                 recent 04/15/2019. CAD, Signs/Symptoms:Chest Pain; Risk                 Factors:Hypertension, Dyslipidemia and Former Smoker.  Sonographer:    Vickie Epley RDCS Referring Phys: [id number] [place]  1. Left ventricular ejection fraction, by estimation, is 60 to 65%. The left ventricle has normal function. The left ventricle has no regional wall motion abnormalities. Left ventricular diastolic parameters are indeterminate.  2. Right ventricular systolic function is normal. The right ventricular size is normal.  3. The mitral valve is normal in structure. Trivial mitral valve regurgitation. No evidence of mitral stenosis.  4. The aortic valve is normal in structure. Aortic valve regurgitation is not visualized. No aortic stenosis is present. FINDINGS  Left Ventricle: Left ventricular ejection fraction, by  estimation, is 60 to 65%. The left ventricle has normal function. The left ventricle has no regional wall motion abnormalities.  The left ventricular internal cavity size was normal in size. There is  no left ventricular hypertrophy. Left ventricular diastolic parameters are indeterminate. Right Ventricle: The right ventricular size is normal. No increase in right ventricular wall thickness. Right ventricular systolic function is normal. Left Atrium: Left atrial size was normal in size. Right Atrium: Right atrial size was normal in size. Pericardium: There is no evidence of pericardial effusion. Mitral Valve: The mitral valve is normal in structure. Trivial mitral valve regurgitation. No evidence of mitral valve stenosis. Tricuspid Valve: The tricuspid valve is grossly normal. Tricuspid valve regurgitation is trivial. Aortic Valve: The aortic valve is normal in structure. Aortic valve regurgitation is not visualized. No aortic stenosis is present. Pulmonic Valve: The pulmonic valve was normal in structure. Pulmonic valve regurgitation is not visualized. No evidence of pulmonic stenosis. Aorta: The aortic root and ascending aorta are structurally normal, with no evidence of dilitation. IAS/Shunts: The atrial septum is grossly normal.  LEFT VENTRICLE PLAX 2D LVIDd:         4.20 cm      Diastology LVIDs:         3.10 cm      LV e' lateral:   8.27 cm/s LV PW:         0.90 cm      LV E/e' lateral: 11.0 LV IVS:        0.90 cm      LV e' medial:    5.77 cm/s LVOT diam:     1.80 cm      LV E/e' medial:  15.8 LV SV:         52 LV SV Index:   27 LVOT Area:     2.54 cm  LV Volumes (MOD) LV vol d, MOD A2C: 121.0 ml LV vol d, MOD A4C: 150.0 ml LV vol s, MOD A2C: 55.3 ml LV vol s, MOD A4C: 67.2 ml LV SV MOD A2C:     65.7 ml LV SV MOD A4C:     150.0 ml LV SV MOD BP:      73.3 ml RIGHT VENTRICLE RV S prime:     9.68 cm/s TAPSE (M-mode): 2.0 cm LEFT ATRIUM             Index       RIGHT ATRIUM           Index LA diam:        4.10 cm  2.14 cm/m  RA Area:     12.50 cm LA Vol (A2C):   30.6 ml 15.98 ml/m RA Volume:   24.50 ml  12.79 ml/m LA Vol (A4C):   32.6 ml 17.02 ml/m LA Biplane Vol: 33.2 ml 17.33 ml/m  AORTIC VALVE LVOT Vmax:   106.00 cm/s LVOT Vmean:  70.700 cm/s LVOT VTI:    0.205 m  AORTA Ao Root diam: 3.10 cm MITRAL VALVE MV Area (PHT): 3.31 cm    SHUNTS MV Decel Time: 229 msec    Systemic VTI:  0.20 m MV E velocity: 91.20 cm/s  Systemic Diam: 1.80 cm MV A velocity: 94.30 cm/s MV E/A ratio:  0.97 Mertie Moores MD Electronically signed by Mertie Moores MD Signature Date/Time: 06/25/2019/3:48:58 PM    Final     Cardiac Studies  TTE 06/25/2019  1. Left ventricular ejection fraction, by estimation, is 60 to 65%. The  left ventricle has normal function. The left ventricle has no regional  wall motion abnormalities. Left ventricular diastolic parameters are  indeterminate.  2. Right ventricular systolic function is normal. The right ventricular  size is normal.  3. The mitral valve is normal in structure. Trivial mitral valve  regurgitation. No evidence of mitral stenosis.  4. The aortic valve is normal in structure. Aortic valve regurgitation is  not visualized. No aortic stenosis is present.   CCTA 04/27/2019 IMPRESSION: 1. 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 Moderate stenosis in the mid LAD, ostial first diagonal, and distal left circumflex. CT FFR will be performed and reported separately.  2. The patient's coronary artery calcium score is 1003, which places the patient in the 97th percentile.  3. Normal coronary origin with left dominance.  IMPRESSION: 1. CT FFR showed hemodynamically significant lesion in the proximal PDA arising from the large, dominant left circumflex artery. PDA measures 2.3 mm proximal to the stenosis and 2 mm distal to the stenosis.    Patient Profile  Karen Baker is a 69 y.o. female with CAD, hypertension, hyperlipidemia who was admitted on 06/24/2019 with  worsening chest pain concerning for unstable angina.  She has no CAD based on her recent cardiac CTA.  Assessment & Plan  1.  Unstable angina -Admitted with worsening chest pain at rest.  Did require nitroglycerin paste yesterday.  Troponin is negative.  EKG without acute ischemic changes.  Has known severe stenosis in the PDA. -Symptoms consistent with unstable angina -Plans for left heart catheterization tomorrow.  N.p.o. at midnight. -Continue aspirin, heparin drip, metoprolol 12.5 mg every 6 hours, Imdur 30 mg daily -Crestor 40 mg daily   2.  Diabetes -A1c 6.7 -We will need Metformin at discharge -Sliding scale insulin house  3.  Hypokalemia -We will hold Lasix.  No volume on her today.  Repletion ordered.  4. Hyperlipidemia -Continue home Crestor  For questions or updates, please contact [place] Please consult [email or web address] for contact info under   FEN -no IVF -diet: heart healthy, n.p.o. at midnight -dvt ppx: heparin drip -code: full -Dispo: pending LHC Monday   Time Spent with Patient: I have spent a total of 35 minutes with patient reviewing hospital notes, telemetry, EKGs, labs and examining the patient as well as establishing an assessment and plan that was discussed with the patient.  > 50% of time was spent in direct patient care.    Signed, Addison Naegeli. Audie Box, [place]  06/26/2019 8:40 AM

## 2019-06-27 ENCOUNTER — Encounter (HOSPITAL_COMMUNITY)
Admission: EM | Disposition: A | Discharge status: Home / Self Care | Payer: Self-pay | Source: Home / Self Care | Attending: Cardiovascular Disease

## 2019-06-27 ENCOUNTER — Encounter: Payer: Self-pay | Admitting: Cardiology

## 2019-06-27 DIAGNOSIS — I25110 Atherosclerotic heart disease of native coronary artery with unstable angina pectoris: Principal | ICD-10-CM

## 2019-06-27 DIAGNOSIS — I10 Essential (primary) hypertension: Secondary | ICD-10-CM

## 2019-06-27 DIAGNOSIS — E782 Mixed hyperlipidemia: Secondary | ICD-10-CM

## 2019-06-27 DIAGNOSIS — I25119 Atherosclerotic heart disease of native coronary artery with unspecified angina pectoris: Secondary | ICD-10-CM

## 2019-06-27 HISTORY — PX: LEFT HEART CATH AND CORONARY ANGIOGRAPHY: CATH118249

## 2019-06-27 LAB — CBC
HCT: 30.8 % — ABNORMAL LOW (ref 36.0–46.0)
Hemoglobin: 9.9 g/dL — ABNORMAL LOW (ref 12.0–15.0)
MCH: 26.8 pg (ref 26.0–34.0)
MCHC: 32.1 g/dL (ref 30.0–36.0)
MCV: 83.5 fL (ref 80.0–100.0)
Platelets: 199 10*3/uL (ref 150–400)
RBC: 3.69 MIL/uL — ABNORMAL LOW (ref 3.87–5.11)
RDW: 14.1 % (ref 11.5–15.5)
WBC: 8.6 10*3/uL (ref 4.0–10.5)
nRBC: 0 % (ref 0.0–0.2)

## 2019-06-27 LAB — GLUCOSE, CAPILLARY
Glucose-Capillary: 93 mg/dL (ref 70–99)
Glucose-Capillary: 100 mg/dL — ABNORMAL HIGH (ref 70–99)
Glucose-Capillary: 136 mg/dL — ABNORMAL HIGH (ref 70–99)
Glucose-Capillary: 133 mg/dL — ABNORMAL HIGH (ref 70–99)

## 2019-06-27 LAB — HEPARIN LEVEL (UNFRACTIONATED): Heparin Unfractionated: 0.25 IU/mL — ABNORMAL LOW (ref 0.30–0.70)

## 2019-06-27 SURGERY — LEFT HEART CATH AND CORONARY ANGIOGRAPHY
Anesthesia: LOCAL

## 2019-06-27 MED ORDER — IOHEXOL 350 MG/ML SOLN
INTRAVENOUS | Status: DC | PRN
  Administered 2019-06-27: 70 mL via INTRA_ARTERIAL

## 2019-06-27 MED ORDER — SODIUM CHLORIDE 0.9% FLUSH: 3.0000 mL | INTRAVENOUS | Status: DC | PRN

## 2019-06-27 MED ORDER — SODIUM CHLORIDE 0.9 % WEIGHT BASED INFUSION: 1.0000 mL/kg/h | INTRAVENOUS | Status: AC

## 2019-06-27 MED ORDER — SODIUM CHLORIDE 0.9% FLUSH
3.0000 mL | Freq: Two times a day (BID) | INTRAVENOUS | Status: DC
  Administered 2019-06-28 (×2): 3 mL via INTRAVENOUS

## 2019-06-27 MED ORDER — HEPARIN (PORCINE) IN NACL 1000-0.9 UT/500ML-% IV SOLN
INTRAVENOUS | Status: DC | PRN
  Administered 2019-06-27 (×2): 500 mL

## 2019-06-27 MED ORDER — ASPIRIN 81 MG PO CHEW
81.0000 mg | CHEWABLE_TABLET | ORAL | Status: AC
  Administered 2019-06-27: 81 mg via ORAL
  Filled 2019-06-27: qty 1

## 2019-06-27 MED ORDER — SENNOSIDES-DOCUSATE SODIUM 8.6-50 MG PO TABS
1.0000 | ORAL_TABLET | Freq: Once | ORAL | Status: AC
  Administered 2019-06-27: 1 via ORAL
  Filled 2019-06-27: qty 1

## 2019-06-27 MED ORDER — VERAPAMIL HCL 2.5 MG/ML IV SOLN
INTRAVENOUS | Status: DC | PRN
  Administered 2019-06-27: 10 mL via INTRA_ARTERIAL

## 2019-06-27 MED ORDER — VERAPAMIL HCL 2.5 MG/ML IV SOLN
INTRAVENOUS | Status: AC
  Filled 2019-06-27: qty 2

## 2019-06-27 MED ORDER — LIDOCAINE HCL (PF) 1 % IJ SOLN
INTRAMUSCULAR | Status: DC | PRN
  Administered 2019-06-27: 4 mL via INTRADERMAL

## 2019-06-27 MED ORDER — LIDOCAINE HCL (PF) 1 % IJ SOLN
INTRAMUSCULAR | Status: AC
  Filled 2019-06-27: qty 30

## 2019-06-27 MED ORDER — MIDAZOLAM HCL 2 MG/2ML IJ SOLN
INTRAMUSCULAR | Status: DC | PRN
  Administered 2019-06-27: 2 mg via INTRAVENOUS

## 2019-06-27 MED ORDER — MIDAZOLAM HCL 2 MG/2ML IJ SOLN
INTRAMUSCULAR | Status: AC
  Filled 2019-06-27: qty 2

## 2019-06-27 MED ORDER — FENTANYL CITRATE (PF) 100 MCG/2ML IJ SOLN
INTRAMUSCULAR | Status: AC
  Filled 2019-06-27: qty 2

## 2019-06-27 MED ORDER — SODIUM CHLORIDE 0.9 % WEIGHT BASED INFUSION
3.0000 mL/kg/h | INTRAVENOUS | Status: DC
  Administered 2019-06-27: 3 mL/kg/h via INTRAVENOUS

## 2019-06-27 MED ORDER — SODIUM CHLORIDE 0.9% FLUSH
3.0000 mL | Freq: Two times a day (BID) | INTRAVENOUS | Status: DC
  Administered 2019-06-27: 3 mL via INTRAVENOUS

## 2019-06-27 MED ORDER — SODIUM CHLORIDE 0.9 % WEIGHT BASED INFUSION: 1.0000 mL/kg/h | INTRAVENOUS | Status: DC

## 2019-06-27 MED ORDER — HEPARIN SODIUM (PORCINE) 1000 UNIT/ML IJ SOLN
INTRAMUSCULAR | Status: AC
  Filled 2019-06-27: qty 1

## 2019-06-27 MED ORDER — SODIUM CHLORIDE 0.9 % IV SOLN: 250.0000 mL | INTRAVENOUS | Status: DC | PRN

## 2019-06-27 MED ORDER — FENTANYL CITRATE (PF) 100 MCG/2ML IJ SOLN
INTRAMUSCULAR | Status: DC | PRN
  Administered 2019-06-27: 25 ug via INTRAVENOUS

## 2019-06-27 MED ORDER — HEPARIN (PORCINE) IN NACL 1000-0.9 UT/500ML-% IV SOLN
INTRAVENOUS | Status: AC
  Filled 2019-06-27: qty 1000

## 2019-06-27 MED ORDER — HEPARIN SODIUM (PORCINE) 1000 UNIT/ML IJ SOLN
INTRAMUSCULAR | Status: DC | PRN
  Administered 2019-06-27: 4500 [IU] via INTRAVENOUS

## 2019-06-27 SURGICAL SUPPLY — 9 items

## 2019-06-27 NOTE — Progress Notes (Addendum)
Progress Note  Patient Name: Karen Baker Date of Encounter: 06/27/2019  Primary Cardiologist: Berniece Salines, DO   Subjective   Had a left-sided lower abdominal pain yesterday which improved after bowel movement (dark looking), felt similar when she had a diverticulitis many years ago.  No chest pain or shortness of breath.  Inpatient Medications    Scheduled Meds: . aspirin EC  81 mg Oral Daily  . feeding supplement (ENSURE ENLIVE)  237 mL Oral BID BM  . insulin aspart  0-15 Units Subcutaneous TID WC  . insulin aspart  0-5 Units Subcutaneous QHS  . isosorbide mononitrate  30 mg Oral Daily  . metoprolol tartrate  12.5 mg Oral Q6H  . pantoprazole  40 mg Oral Daily  . rosuvastatin  40 mg Oral Daily  . senna-docusate  1 tablet Oral Once  . sodium chloride flush  3 mL Intravenous Q12H   Continuous Infusions: . sodium chloride    . sodium chloride 1 mL/kg/hr (06/27/19 0548)  . heparin 1,200 Units/hr (06/27/19 0830)   PRN Meds: sodium chloride, acetaminophen, alum & mag hydroxide-simeth, nitroGLYCERIN, ondansetron (ZOFRAN) IV, sodium chloride flush   Vital Signs    Vitals:   06/27/19 0350 06/27/19 0434 06/27/19 0502 06/27/19 0716  BP: (!) 108/59 126/68  126/67  Pulse: (!) 57 (!) 57  (!) 57  Resp: 15 17  19   Temp:  99 F (37.2 C)  98.8 F (37.1 C)  TempSrc: Oral Oral  Oral  SpO2: 98%   94%  Weight:   83.2 kg   Height:        Intake/Output Summary (Last 24 hours) at 06/27/2019 1051 Last data filed at 06/26/2019 1800 Gross per 24 hour  Intake 802.4 ml  Output --  Net 802.4 ml   Last 3 Weights 06/27/2019 06/24/2019 05/12/2019  Weight (lbs) 183 lb 6.8 oz 185 lb 6.5 oz 188 lb 8 oz  Weight (kg) 83.2 kg 84.1 kg 85.503 kg      Telemetry    Normal sinus rhythm- Personally Reviewed  ECG    No new tracing  Physical Exam   GEN: No acute distress.   Neck: No JVD Cardiac: RRR, no murmurs, rubs, or gallops.  Respiratory: Clear to auscultation bilaterally. GI: Soft,  LL on tender, non-distended  MS: No edema; No deformity. Neuro:  Nonfocal  Psych: Normal affect   Labs    High Sensitivity Troponin:   Recent Labs  Lab 06/24/19 1725 06/24/19 1925  TROPONINIHS 11 12      Chemistry Recent Labs  Lab 06/24/19 1725 06/25/19 0037 06/26/19 0305  NA 134* 135 138  K 3.0* 2.8* 4.0  CL 98 98 103  CO2 27 26 27   GLUCOSE 157* 132* 120*  BUN 13 11 13   CREATININE 0.99 0.99 1.01*  CALCIUM 10.3 10.3 10.0  GFRNONAA 58* 58* 56*  GFRAA >60 >60 >60  ANIONGAP 9 11 8      Hematology Recent Labs  Lab 06/25/19 0037 06/26/19 0305 06/27/19 0254  WBC 6.1 7.7 8.6  RBC 3.73* 3.88 3.69*  HGB 10.1* 10.4* 9.9*  HCT 30.5* 32.1* 30.8*  MCV 81.8 82.7 83.5  MCH 27.1 26.8 26.8  MCHC 33.1 32.4 32.1  RDW 13.8 14.3 14.1  PLT 198 211 199    Radiology    ECHOCARDIOGRAM COMPLETE  Result Date: 06/25/2019    ECHOCARDIOGRAM REPORT   Patient Name:   Karen Baker Date of Exam: 06/25/2019 Medical Rec #:  [id number]     Height:  65.0 in Accession #:    [id number]    Weight:       185.4 lb Date of Birth:  September 12, 1950     BSA:          1.915 m Patient Age:    69 years      BP:           128/68 mmHg Patient Gender: F             HR:           66 bpm. Exam Location:  Inpatient Procedure: 2D Echo, Cardiac Doppler and Color Doppler Indications:    414.01/ I25.10  History:        Patient has prior history of Echocardiogram examinations, most                 recent 04/15/2019. CAD, Signs/Symptoms:Chest Pain; Risk                 Factors:Hypertension, Dyslipidemia and Former Smoker.  Sonographer:    Vickie Epley RDCS Referring Phys: [id number] [place]  1. Left ventricular ejection fraction, by estimation, is 60 to 65%. The left ventricle has normal function. The left ventricle has no regional wall motion abnormalities. Left ventricular diastolic parameters are indeterminate.  2. Right ventricular systolic function is normal. The right ventricular size is normal.  3.  The mitral valve is normal in structure. Trivial mitral valve regurgitation. No evidence of mitral stenosis.  4. The aortic valve is normal in structure. Aortic valve regurgitation is not visualized. No aortic stenosis is present. FINDINGS  Left Ventricle: Left ventricular ejection fraction, by estimation, is 60 to 65%. The left ventricle has normal function. The left ventricle has no regional wall motion abnormalities. The left ventricular internal cavity size was normal in size. There is  no left ventricular hypertrophy. Left ventricular diastolic parameters are indeterminate. Right Ventricle: The right ventricular size is normal. No increase in right ventricular wall thickness. Right ventricular systolic function is normal. Left Atrium: Left atrial size was normal in size. Right Atrium: Right atrial size was normal in size. Pericardium: There is no evidence of pericardial effusion. Mitral Valve: The mitral valve is normal in structure. Trivial mitral valve regurgitation. No evidence of mitral valve stenosis. Tricuspid Valve: The tricuspid valve is grossly normal. Tricuspid valve regurgitation is trivial. Aortic Valve: The aortic valve is normal in structure. Aortic valve regurgitation is not visualized. No aortic stenosis is present. Pulmonic Valve: The pulmonic valve was normal in structure. Pulmonic valve regurgitation is not visualized. No evidence of pulmonic stenosis. Aorta: The aortic root and ascending aorta are structurally normal, with no evidence of dilitation. IAS/Shunts: The atrial septum is grossly normal.  LEFT VENTRICLE PLAX 2D LVIDd:         4.20 cm      Diastology LVIDs:         3.10 cm      LV e' lateral:   8.27 cm/s LV PW:         0.90 cm      LV E/e' lateral: 11.0 LV IVS:        0.90 cm      LV e' medial:    5.77 cm/s LVOT diam:     1.80 cm      LV E/e' medial:  15.8 LV SV:         52 LV SV Index:   27 LVOT Area:     2.54 cm  LV Volumes (MOD) LV vol d, MOD A2C: 121.0 ml LV vol d, MOD A4C:  150.0 ml LV vol s, MOD A2C: 55.3 ml LV vol s, MOD A4C: 67.2 ml LV SV MOD A2C:     65.7 ml LV SV MOD A4C:     150.0 ml LV SV MOD BP:      73.3 ml RIGHT VENTRICLE RV S prime:     9.68 cm/s TAPSE (M-mode): 2.0 cm LEFT ATRIUM             Index       RIGHT ATRIUM           Index LA diam:        4.10 cm 2.14 cm/m  RA Area:     12.50 cm LA Vol (A2C):   30.6 ml 15.98 ml/m RA Volume:   24.50 ml  12.79 ml/m LA Vol (A4C):   32.6 ml 17.02 ml/m LA Biplane Vol: 33.2 ml 17.33 ml/m  AORTIC VALVE LVOT Vmax:   106.00 cm/s LVOT Vmean:  70.700 cm/s LVOT VTI:    0.205 m  AORTA Ao Root diam: 3.10 cm MITRAL VALVE MV Area (PHT): 3.31 cm    SHUNTS MV Decel Time: 229 msec    Systemic VTI:  0.20 m MV E velocity: 91.20 cm/s  Systemic Diam: 1.80 cm MV A velocity: 94.30 cm/s MV E/A ratio:  0.97 Mertie Moores MD Electronically signed by Mertie Moores MD Signature Date/Time: 06/25/2019/3:48:58 PM    Final     Cardiac Studies   Echo 06/25/19 1. Left ventricular ejection fraction, by estimation, is 60 to 65%. The  left ventricle has normal function. The left ventricle has no regional  wall motion abnormalities. Left ventricular diastolic parameters are  indeterminate.  2. Right ventricular systolic function is normal. The right ventricular  size is normal.  3. The mitral valve is normal in structure. Trivial mitral valve  regurgitation. No evidence of mitral stenosis.  4. The aortic valve is normal in structure. Aortic valve regurgitation is  not visualized. No aortic stenosis is present.   CCTA 04/27/2019 IMPRESSION: 1. 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 Moderate stenosis in the mid LAD, ostial first diagonal, and distal left circumflex. CT FFR will be performed and reported separately.  2. The patient's coronary artery calcium score is 1003, which places the patient in the 97th percentile.  3. Normal coronary origin with left dominance.  IMPRESSION: 1. CT FFR showed hemodynamically significant  lesion in the proximal PDA arising from the large, dominant left circumflex artery. PDA measures 2.3 mm proximal to the stenosis and 2 mm distal to the stenosis.  Patient Profile     69 y.o. female with history of hypertension, hyperlipidemia recent finding of CAD based on cardiac CTA presented for symptoms concerning for unstable angina.  Assessment & Plan    1.  Unstable angina -Troponin negative.  EKG without acute ischemic changes.  Chest pain improved with nitroglycerin in past.  Coronary CT in January 2021 showed proximal PDA disease. -Plan for cardiac catheterization later today -Continue aspirin, statin, Imdur and metoprolol  2.  New diabetes mellitus -Hemoglobin A1c 6.7 -On sliding scale insulin here -Will need Metformin at discharge  3.  Hyperlipidemia - 06/25/2019: Cholesterol 96; HDL 32; LDL Cholesterol 42; Triglycerides 112; VLDL 22  -Continue statin  4.  Anemia/dark stool/constipation -Patient had left lower abdominal pain yesterday, similar to diverticulitis many years ago, this improved after bowel movement which was dark.  She had prior dark stool.  Currently complains of dull achy pain.  Mild nosebleed this morning which has spontaneously resolved. -Hemoglobin stable since admit 10.6>>10.1>>10.4>>9.9 ( Hgb was 12.5 on 04/2019 however was in 10s range in 2016).  Denies prior history of anemia. -Continue IV heparin -Give stool softener later today and check stool guaiac - CBC tomorrow   For questions or updates, please contact [facility] Please consult [email or web address] for contact info under        SignedLeanor Kail, PA  06/27/2019, 10:51 AM     Patient seen and examined. Agree with assessment and plan.No recurrent chest pain. CT calcium score 1003; CTA suggests moderate multivessel plaque with tightest stenosis in PDA with hemodynamic significance.  Pt had significant chest pain on Friday worrisome for unstable angina leading to presentation. Trop  negative. I have reviewed the risks, indications, and alternatives to cardiac catheterization, possible angioplasty, and stenting with the patient. Risks include but are not limited to bleeding, infection, vascular injury, stroke, myocardial infection, arrhythmia, kidney injury, radiation-related injury in the case of prolonged fluoroscopy use, emergency cardiac surgery, and death. The patient understands the risks of serious complication is 1-2 in [id number] with diagnostic cardiac cath and 1-2% or less with angioplasty/stenting.    Troy Sine, MD, [facility] 06/27/2019 11:19 AM

## 2019-06-27 NOTE — Interval H&P Note (Signed)
History and Physical Interval Note:  06/27/2019 12:04 PM  Eliabeth Ringgold  has presented today for surgery, with the diagnosis of unstable angina.  The various methods of treatment have been discussed with the patient and family. After consideration of risks, benefits and other options for treatment, the patient has consented to  Procedure(s): LEFT HEART CATH AND CORONARY ANGIOGRAPHY (N/A) as a surgical intervention.  The patient's history has been reviewed, patient examined, no change in status, stable for surgery.  I have reviewed the patient's chart and labs.  Questions were answered to the patient's satisfaction.   Cath Lab Visit (complete for each Cath Lab visit)  Clinical Evaluation Leading to the Procedure:   ACS: Yes.    Non-ACS:    Anginal Classification: CCS IV  Anti-ischemic medical therapy: Maximal Therapy (2 or more classes of medications)  Non-Invasive Test Results: Intermediate-risk stress test findings: cardiac mortality 1-3%/year  Prior CABG: No previous CABG        Collier Salina [facility] 06/27/2019 12:04 PM

## 2019-06-27 NOTE — H&P (View-Only) (Signed)
Progress Note  Patient Name: Karen Baker Date of Encounter: 06/27/2019  Primary Cardiologist: Berniece Salines, DO   Subjective   Had a left-sided lower abdominal pain yesterday which improved after bowel movement (dark looking), felt similar when she had a diverticulitis many years ago.  No chest pain or shortness of breath.  Inpatient Medications    Scheduled Meds: . aspirin EC  81 mg Oral Daily  . feeding supplement (ENSURE ENLIVE)  237 mL Oral BID BM  . insulin aspart  0-15 Units Subcutaneous TID WC  . insulin aspart  0-5 Units Subcutaneous QHS  . isosorbide mononitrate  30 mg Oral Daily  . metoprolol tartrate  12.5 mg Oral Q6H  . pantoprazole  40 mg Oral Daily  . rosuvastatin  40 mg Oral Daily  . senna-docusate  1 tablet Oral Once  . sodium chloride flush  3 mL Intravenous Q12H   Continuous Infusions: . sodium chloride    . sodium chloride 1 mL/kg/hr (06/27/19 0548)  . heparin 1,200 Units/hr (06/27/19 0830)   PRN Meds: sodium chloride, acetaminophen, alum & mag hydroxide-simeth, nitroGLYCERIN, ondansetron (ZOFRAN) IV, sodium chloride flush   Vital Signs    Vitals:   06/27/19 0350 06/27/19 0434 06/27/19 0502 06/27/19 0716  BP: (!) 108/59 126/68  126/67  Pulse: (!) 57 (!) 57  (!) 57  Resp: 15 17  19   Temp:  99 F (37.2 C)  98.8 F (37.1 C)  TempSrc: Oral Oral  Oral  SpO2: 98%   94%  Weight:   83.2 kg   Height:        Intake/Output Summary (Last 24 hours) at 06/27/2019 1051 Last data filed at 06/26/2019 1800 Gross per 24 hour  Intake 802.4 ml  Output --  Net 802.4 ml   Last 3 Weights 06/27/2019 06/24/2019 05/12/2019  Weight (lbs) 183 lb 6.8 oz 185 lb 6.5 oz 188 lb 8 oz  Weight (kg) 83.2 kg 84.1 kg 85.503 kg      Telemetry    Normal sinus rhythm- Personally Reviewed  ECG    No new tracing  Physical Exam   GEN: No acute distress.   Neck: No JVD Cardiac: RRR, no murmurs, rubs, or gallops.  Respiratory: Clear to auscultation bilaterally. GI: Soft,  LL on tender, non-distended  MS: No edema; No deformity. Neuro:  Nonfocal  Psych: Normal affect   Labs    High Sensitivity Troponin:   Recent Labs  Lab 06/24/19 1725 06/24/19 1925  TROPONINIHS 11 12      Chemistry Recent Labs  Lab 06/24/19 1725 06/25/19 0037 06/26/19 0305  NA 134* 135 138  K 3.0* 2.8* 4.0  CL 98 98 103  CO2 27 26 27   GLUCOSE 157* 132* 120*  BUN 13 11 13   CREATININE 0.99 0.99 1.01*  CALCIUM 10.3 10.3 10.0  GFRNONAA 58* 58* 56*  GFRAA >60 >60 >60  ANIONGAP 9 11 8      Hematology Recent Labs  Lab 06/25/19 0037 06/26/19 0305 06/27/19 0254  WBC 6.1 7.7 8.6  RBC 3.73* 3.88 3.69*  HGB 10.1* 10.4* 9.9*  HCT 30.5* 32.1* 30.8*  MCV 81.8 82.7 83.5  MCH 27.1 26.8 26.8  MCHC 33.1 32.4 32.1  RDW 13.8 14.3 14.1  PLT 198 211 199    Radiology    ECHOCARDIOGRAM COMPLETE  Result Date: 06/25/2019    ECHOCARDIOGRAM REPORT   Patient Name:   Karen Baker Date of Exam: 06/25/2019 Medical Rec #:  [id number]     Height:  65.0 in Accession #:    [id number]    Weight:       185.4 lb Date of Birth:  April 13, 2049     BSA:          1.915 m Patient Age:    69 years      BP:           128/68 mmHg Patient Gender: F             HR:           66 bpm. Exam Location:  Inpatient Procedure: 2D Echo, Cardiac Doppler and Color Doppler Indications:    414.01/ I25.10  History:        Patient has prior history of Echocardiogram examinations, most                 recent 04/15/2019. CAD, Signs/Symptoms:Chest Pain; Risk                 Factors:Hypertension, Dyslipidemia and Former Smoker.  Sonographer:    Vickie Epley RDCS Referring Phys: [id number] [place]  1. Left ventricular ejection fraction, by estimation, is 60 to 65%. The left ventricle has normal function. The left ventricle has no regional wall motion abnormalities. Left ventricular diastolic parameters are indeterminate.  2. Right ventricular systolic function is normal. The right ventricular size is normal.  3.  The mitral valve is normal in structure. Trivial mitral valve regurgitation. No evidence of mitral stenosis.  4. The aortic valve is normal in structure. Aortic valve regurgitation is not visualized. No aortic stenosis is present. FINDINGS  Left Ventricle: Left ventricular ejection fraction, by estimation, is 60 to 65%. The left ventricle has normal function. The left ventricle has no regional wall motion abnormalities. The left ventricular internal cavity size was normal in size. There is  no left ventricular hypertrophy. Left ventricular diastolic parameters are indeterminate. Right Ventricle: The right ventricular size is normal. No increase in right ventricular wall thickness. Right ventricular systolic function is normal. Left Atrium: Left atrial size was normal in size. Right Atrium: Right atrial size was normal in size. Pericardium: There is no evidence of pericardial effusion. Mitral Valve: The mitral valve is normal in structure. Trivial mitral valve regurgitation. No evidence of mitral valve stenosis. Tricuspid Valve: The tricuspid valve is grossly normal. Tricuspid valve regurgitation is trivial. Aortic Valve: The aortic valve is normal in structure. Aortic valve regurgitation is not visualized. No aortic stenosis is present. Pulmonic Valve: The pulmonic valve was normal in structure. Pulmonic valve regurgitation is not visualized. No evidence of pulmonic stenosis. Aorta: The aortic root and ascending aorta are structurally normal, with no evidence of dilitation. IAS/Shunts: The atrial septum is grossly normal.  LEFT VENTRICLE PLAX 2D LVIDd:         4.20 cm      Diastology LVIDs:         3.10 cm      LV e' lateral:   8.27 cm/s LV PW:         0.90 cm      LV E/e' lateral: 11.0 LV IVS:        0.90 cm      LV e' medial:    5.77 cm/s LVOT diam:     1.80 cm      LV E/e' medial:  15.8 LV SV:         52 LV SV Index:   27 LVOT Area:     2.54 cm  LV Volumes (MOD) LV vol d, MOD A2C: 121.0 ml LV vol d, MOD A4C:  150.0 ml LV vol s, MOD A2C: 55.3 ml LV vol s, MOD A4C: 67.2 ml LV SV MOD A2C:     65.7 ml LV SV MOD A4C:     150.0 ml LV SV MOD BP:      73.3 ml RIGHT VENTRICLE RV S prime:     9.68 cm/s TAPSE (M-mode): 2.0 cm LEFT ATRIUM             Index       RIGHT ATRIUM           Index LA diam:        4.10 cm 2.14 cm/m  RA Area:     12.50 cm LA Vol (A2C):   30.6 ml 15.98 ml/m RA Volume:   24.50 ml  12.79 ml/m LA Vol (A4C):   32.6 ml 17.02 ml/m LA Biplane Vol: 33.2 ml 17.33 ml/m  AORTIC VALVE LVOT Vmax:   106.00 cm/s LVOT Vmean:  70.700 cm/s LVOT VTI:    0.205 m  AORTA Ao Root diam: 3.10 cm MITRAL VALVE MV Area (PHT): 3.31 cm    SHUNTS MV Decel Time: 229 msec    Systemic VTI:  0.20 m MV E velocity: 91.20 cm/s  Systemic Diam: 1.80 cm MV A velocity: 94.30 cm/s MV E/A ratio:  0.97 Mertie Moores MD Electronically signed by Mertie Moores MD Signature Date/Time: 06/25/2019/3:48:58 PM    Final     Cardiac Studies   Echo 06/25/19 1. Left ventricular ejection fraction, by estimation, is 60 to 65%. The  left ventricle has normal function. The left ventricle has no regional  wall motion abnormalities. Left ventricular diastolic parameters are  indeterminate.  2. Right ventricular systolic function is normal. The right ventricular  size is normal.  3. The mitral valve is normal in structure. Trivial mitral valve  regurgitation. No evidence of mitral stenosis.  4. The aortic valve is normal in structure. Aortic valve regurgitation is  not visualized. No aortic stenosis is present.   CCTA 04/27/2019 IMPRESSION: 1. 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 Moderate stenosis in the mid LAD, ostial first diagonal, and distal left circumflex. CT FFR will be performed and reported separately.  2. The patient's coronary artery calcium score is 1003, which places the patient in the 97th percentile.  3. Normal coronary origin with left dominance.  IMPRESSION: 1. CT FFR showed hemodynamically significant  lesion in the proximal PDA arising from the large, dominant left circumflex artery. PDA measures 2.3 mm proximal to the stenosis and 2 mm distal to the stenosis.  Patient Profile     69 y.o. female with history of hypertension, hyperlipidemia recent finding of CAD based on cardiac CTA presented for symptoms concerning for unstable angina.  Assessment & Plan    1.  Unstable angina -Troponin negative.  EKG without acute ischemic changes.  Chest pain improved with nitroglycerin in past.  Coronary CT in January 2021 showed proximal PDA disease. -Plan for cardiac catheterization later today -Continue aspirin, statin, Imdur and metoprolol  2.  New diabetes mellitus -Hemoglobin A1c 6.7 -On sliding scale insulin here -Will need Metformin at discharge  3.  Hyperlipidemia - 06/25/2019: Cholesterol 96; HDL 32; LDL Cholesterol 42; Triglycerides 112; VLDL 22  -Continue statin  4.  Anemia/dark stool/constipation -Patient had left lower abdominal pain yesterday, similar to diverticulitis many years ago, this improved after bowel movement which was dark.  She had prior dark stool.  Currently complains of dull achy pain.  Mild nosebleed this morning which has spontaneously resolved. -Hemoglobin stable since admit 10.6>>10.1>>10.4>>9.9 ( Hgb was 12.5 on 04/2019 however was in 10s range in 2016).  Denies prior history of anemia. -Continue IV heparin -Give stool softener later today and check stool guaiac - CBC tomorrow   For questions or updates, please contact [facility] Please consult [email or web address] for contact info under        SignedLeanor Kail, PA  06/27/2019, 10:51 AM     Patient seen and examined. Agree with assessment and plan.No recurrent chest pain. CT calcium score 1003; CTA suggests moderate multivessel plaque with tightest stenosis in PDA with hemodynamic significance.  Pt had significant chest pain on Friday worrisome for unstable angina leading to presentation. Trop  negative. I have reviewed the risks, indications, and alternatives to cardiac catheterization, possible angioplasty, and stenting with the patient. Risks include but are not limited to bleeding, infection, vascular injury, stroke, myocardial infection, arrhythmia, kidney injury, radiation-related injury in the case of prolonged fluoroscopy use, emergency cardiac surgery, and death. The patient understands the risks of serious complication is 1-2 in [id number] with diagnostic cardiac cath and 1-2% or less with angioplasty/stenting.    Troy Sine, MD, [facility] 06/27/2019 11:19 AM

## 2019-06-27 NOTE — Progress Notes (Signed)
TR band removed. All air removed from band. TR band left on for 30 mins after removing all air. Site clean dry and intact. Pt states site sore. Site covered with gauze and tegaderm. Rn will continue to monitor.

## 2019-06-27 NOTE — Progress Notes (Signed)
[place] for heparin Indication: chest pain/ACS  Allergies  Allergen Reactions  . Azithromycin Hives    Patient Measurements: Height: 5\' 5"  (165.1 cm) Weight: 183 lb 6.8 oz (83.2 kg) IBW/kg (Calculated) : 57 Heparin Dosing Weight: 75  Vital Signs: Temp: 99 F (37.2 C) (03/29 0434) Temp Source: Oral (03/29 0434) BP: 126/68 (03/29 0434) Pulse Rate: 57 (03/29 0434)  Labs: Recent Labs    06/24/19 1725 06/24/19 1725 06/24/19 1925 06/25/19 0037 06/25/19 0037 06/25/19 2015 06/26/19 0305 06/27/19 0254  HGB 10.6*   < >  --  10.1*   < >  --  10.4* 9.9*  HCT 32.2*   < >  --  30.5*  --   --  32.1* 30.8*  PLT 219   < >  --  198  --   --  211 199  HEPARINUNFRC  --   --   --   --   --  0.54 0.36 0.25*  CREATININE 0.99  --   --  0.99  --   --  1.01*  --   TROPONINIHS 11  --  12  --   --   --   --   --    < > = values in this interval not displayed.    Estimated Creatinine Clearance: 55.2 mL/min (A) (by C-G formula based on SCr of 1.01 mg/dL (H)).   Medical History: Past Medical History:  Diagnosis Date  . Anxiety    takes Xanax daily  . Arthritis   . Constipation    takes Colace daily  . GERD (gastroesophageal reflux disease)    takes Omeprazole daily  . History of blood transfusion    no abnormal reaction noted  . History of bronchitis 3-73yrs ago  . Hyperlipidemia    takes Lovastatin daily  . Hypertension    takes Amlodipine daily  . Joint pain   . Joint swelling   . 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 Primary osteoarthritis of left hip 05/30/2014  . Urinary frequency   . Urinary urgency    Assessment: 69 year old female with likely unstable angina, new orders received to start IV heparin. Possible cath on Monday.   Heparin level subtherapeutic this morning, CBC stable, no bleeding noted.  Goal of Therapy:  Heparin level 0.3-0.7 units/ml Monitor platelets by anticoagulation protocol: Yes   Plan:  Increase heparin to 1200  units/h Cath today   Arrie Senate, PharmD, BCPS Clinical Pharmacist [phone] Please check AMION for all [place] numbers 06/27/2019

## 2019-06-27 NOTE — Plan of Care (Signed)

## 2019-06-28 DIAGNOSIS — E118 Type 2 diabetes mellitus with unspecified complications: Secondary | ICD-10-CM

## 2019-06-28 LAB — CBC
HCT: 31 % — ABNORMAL LOW (ref 36.0–46.0)
Hemoglobin: 9.8 g/dL — ABNORMAL LOW (ref 12.0–15.0)
MCH: 26.7 pg (ref 26.0–34.0)
MCHC: 31.6 g/dL (ref 30.0–36.0)
MCV: 84.5 fL (ref 80.0–100.0)
Platelets: 198 10*3/uL (ref 150–400)
RBC: 3.67 MIL/uL — ABNORMAL LOW (ref 3.87–5.11)
RDW: 14.4 % (ref 11.5–15.5)
WBC: 6.7 10*3/uL (ref 4.0–10.5)
nRBC: 0 % (ref 0.0–0.2)

## 2019-06-28 LAB — GLUCOSE, CAPILLARY
Glucose-Capillary: 109 mg/dL — ABNORMAL HIGH (ref 70–99)
Glucose-Capillary: 123 mg/dL — ABNORMAL HIGH (ref 70–99)

## 2019-06-28 LAB — BASIC METABOLIC PANEL
Anion gap: 6 (ref 5–15)
BUN: 14 mg/dL (ref 8–23)
CO2: 27 mmol/L (ref 22–32)
Calcium: 10.1 mg/dL (ref 8.9–10.3)
Chloride: 104 mmol/L (ref 98–111)
Creatinine, Ser: 0.96 mg/dL (ref 0.44–1.00)
GFR calc Af Amer: >60 mL/min (ref >60)
GFR calc non Af Amer: 60 mL/min — ABNORMAL LOW (ref >60)
Glucose, Bld: 125 mg/dL — ABNORMAL HIGH (ref 70–99)
Potassium: 4.5 mmol/L (ref 3.5–5.1)
Sodium: 137 mmol/L (ref 135–145)

## 2019-06-28 NOTE — Progress Notes (Signed)
Patient ambulated in hallway approximately 460 ft. Patient tolerated well. O2 sats between 96 - 98. Pt heart rate remained in the 80's. Awaiting discharge instructions from MD.

## 2019-06-28 NOTE — Discharge Summary (Addendum)
Discharge Summary    Patient ID: Karen Baker MRN: [id number]; DOB: [birth year]  Admit date: 06/24/2019 Discharge date: 06/28/2019  Primary Care Provider: Earlyne Iba, NP  Primary Cardiologist: Berniece Salines, DO  Discharge Diagnoses    Principal Problem:   Unstable angina [facility]) Active Problems:   Essential hypertension   Mixed hyperlipidemia   Coronary artery disease involving native coronary artery of native heart with angina pectoris ([place])   Type 2 diabetes mellitus with complication, without long-term current use of insulin (HCC)   Constipation   Diagnostic Studies/Procedures    LEFT HEART CATH AND CORONARY ANGIOGRAPHY  06/27/19  Conclusion    Prox LAD to Mid LAD lesion is 35% stenosed.  2nd Diag lesion is 70% stenosed.  LPDA lesion is 35% stenosed.  Ost RCA lesion is 25% stenosed.  The left ventricular systolic function is normal.  LV end diastolic pressure is normal.  The left ventricular ejection fraction is 55-65% by visual estimate.   1. Nonobstructive CAD 2. Normal LV function 3. Elevated LVEDP 24 mm Hg.  Plan: medical management. Patient may be a candidate for DC later today.   Diagnostic Dominance: Left    Echo 06/25/19 1. Left ventricular ejection fraction, by estimation, is 60 to 65%. The  left ventricle has normal function. The left ventricle has no regional  wall motion abnormalities. Left ventricular diastolic parameters are  indeterminate.  2. Right ventricular systolic function is normal. The right ventricular  size is normal.  3. The mitral valve is normal in structure. Trivial mitral valve  regurgitation. No evidence of mitral stenosis.  4. The aortic valve is normal in structure. Aortic valve regurgitation is  not visualized. No aortic stenosis is present.       History of Present Illness     Karen Baker is a 69 y.o. female with hx of hypertension, hyperlipidemia recent finding of CAD based on cardiac CTA presented for  symptoms concerning for unstable angina.  Karen Baker has been followed by cardiologist Dr Harriet Masson. She has reported symptoms of chest pain in the past and underwent coronary CTA in January of 2021. This showed a hemodynamically significant lesion in the proximal PDA arising from a large dominant left circumflex artery. Her most recent echo shows normal LVEF. She opted for medical treatment of her CAD. She was started on crestor, aspirin, and imdur. She was not treated with a beta blocker, possibly due to borderline low blood pressure. She has been taking all of her medications as prescribed since that time.   The patient presented with worsening chest pain. She states that over the past few weeks she has had occasional, very mild chest pain with exertion approximately once per week that only lasts for a few seconds.  This has been manageable for her and she has not had to take any nitroglycerin.  However she developed sudden onset severe chest pain/pressure while she was laying on the couch watching TV.  She had never had symptoms this severe before.  She took 1 nitroglycerin tablet with minimal relief in her symptoms.  She took a second nitroglycerin tablet and her symptoms improved substantially.  She came to the emergency department for evaluation.  In the ED, the patient was initially hypertensive with systolic blood pressures in the 160s, however this improved to the 130s.  The rest of her vital signs were within normal limits.  Her ECG was similar to baseline without any ischemic changes.  Her troponin was initially 11 and on follow-up  2 hours later was 12.  Cardiology was consulted for further management.  Hospital Course     Consultants: None  1.  Unstable angina -Troponin negative.  EKG without acute ischemic changes.  Chest pain improved with nitroglycerin.  - Echo showed preserved LVEF.  - Cath showed Nonobstructive CAD with 70% 2nd dig. Recommended medical therapy.  - Ambulated well.    -Continue aspirin, statin, Imdur 30mg  qd and  -Did not able to tolerate beta-blocker due to bradycardia  2.  New diabetes mellitus -Hemoglobin A1c 6.7 -On sliding scale insulin while admitted  -Advised to follow up with PCP  3.  Hyperlipidemia - 06/25/2019: Cholesterol 96; HDL 32; LDL Cholesterol 42; Triglycerides 112; VLDL 22  -Continue statin  4.  Constipation -Patient had left lower abdominal pain yesterday, similar to diverticulitis many years ago, this improved and eventually resolved after bowel movement .   5. Chronic anemia - Hgb stable 10.6>>10.1>>10.4>>9.9 >>9.8 at discharge.  -Advised to follow up with PCP   Did the patient have an acute coronary syndrome (MI, NSTEMI, STEMI, etc) this admission?:  No                               Did the patient have a percutaneous coronary intervention (stent / angioplasty)?:  No.     Discharge Vitals Blood pressure 115/66, pulse (!) 59, temperature 98 F (36.7 C), temperature source Axillary, resp. rate 16, height 5\' 5"  (1.651 m), weight 83.2 kg, SpO2 99 %.  Filed Weights   06/24/19 2330 06/27/19 0502  Weight: 84.1 kg 83.2 kg   Physical Exam  Constitutional: She is oriented to person, place, and time and well-developed, well-nourished, and in no distress.  HENT:  Head: Normocephalic and atraumatic.  Eyes: Pupils are equal, round, and reactive to light. EOM are normal.  Cardiovascular: Normal rate and regular rhythm.  Right radial cath site stable   Pulmonary/Chest: Effort normal and breath sounds normal.  Abdominal: Soft. Bowel sounds are normal.  Musculoskeletal:        General: Normal range of motion.     Cervical back: Normal range of motion and neck supple.  Neurological: She is alert and oriented to person, place, and time.  Skin: Skin is warm and dry.  Psychiatric: Affect normal.   Labs & Radiologic Studies    CBC Recent Labs    06/27/19 0254 06/28/19 0230  WBC 8.6 6.7  HGB 9.9* 9.8*  HCT 30.8* 31.0*  MCV  83.5 84.5  PLT 199 [id number]   Basic Metabolic Panel Recent Labs    06/26/19 0305 06/28/19 0230  NA 138 137  K 4.0 4.5  CL 103 104  CO2 27 27  GLUCOSE 120* 125*  BUN 13 14  CREATININE 1.01* 0.96  CALCIUM 10.0 10.1   High Sensitivity Troponin:   Recent Labs  Lab 06/24/19 1725 06/24/19 1925  TROPONINIHS 11 12   _____________  CARDIAC CATHETERIZATION  Result Date: 06/27/2019  Prox LAD to Mid LAD lesion is 35% stenosed.  2nd Diag lesion is 70% stenosed.  LPDA lesion is 35% stenosed.  Ost RCA lesion is 25% stenosed.  The left ventricular systolic function is normal.  LV end diastolic pressure is normal.  The left ventricular ejection fraction is 55-65% by visual estimate.  1. Nonobstructive CAD 2. Normal LV function 3. Elevated LVEDP 24 mm Hg. Plan: medical management. Patient may be a candidate for DC later today.   DG  Chest Portable 1 View  Result Date: 06/24/2019 CLINICAL DATA:  Chest pain. EXAM: PORTABLE CHEST 1 VIEW COMPARISON:  05/26/2016 FINDINGS: The heart size is stable from prior study. Aortic calcifications are noted. There is no pneumothorax. No large pleural effusion. No focal infiltrate. No acute osseous abnormality. IMPRESSION: No active disease. Electronically Signed   By: Constance Holster M.D.   On: 06/24/2019 18:31   ECHOCARDIOGRAM COMPLETE  Result Date: 06/25/2019    ECHOCARDIOGRAM REPORT   Patient Name:   Karen Baker Date of Exam: 06/25/2019 Medical Rec #:  [id number]     Height:       65.0 in Accession #:    [id number]    Weight:       185.4 lb Date of Birth:  20-Apr-1950     BSA:          1.915 m Patient Age:    63 years      BP:           128/68 mmHg Patient Gender: F             HR:           66 bpm. Exam Location:  Inpatient Procedure: 2D Echo, Cardiac Doppler and Color Doppler Indications:    414.01/ I25.10  History:        Patient has prior history of Echocardiogram examinations, most                 recent 04/15/2019. CAD, Signs/Symptoms:Chest Pain; Risk                  Factors:Hypertension, Dyslipidemia and Former Smoker.  Sonographer:    Vickie Epley RDCS Referring Phys: [id number] [place]  1. Left ventricular ejection fraction, by estimation, is 60 to 65%. The left ventricle has normal function. The left ventricle has no regional wall motion abnormalities. Left ventricular diastolic parameters are indeterminate.  2. Right ventricular systolic function is normal. The right ventricular size is normal.  3. The mitral valve is normal in structure. Trivial mitral valve regurgitation. No evidence of mitral stenosis.  4. The aortic valve is normal in structure. Aortic valve regurgitation is not visualized. No aortic stenosis is present. FINDINGS  Left Ventricle: Left ventricular ejection fraction, by estimation, is 60 to 65%. The left ventricle has normal function. The left ventricle has no regional wall motion abnormalities. The left ventricular internal cavity size was normal in size. There is  no left ventricular hypertrophy. Left ventricular diastolic parameters are indeterminate. Right Ventricle: The right ventricular size is normal. No increase in right ventricular wall thickness. Right ventricular systolic function is normal. Left Atrium: Left atrial size was normal in size. Right Atrium: Right atrial size was normal in size. Pericardium: There is no evidence of pericardial effusion. Mitral Valve: The mitral valve is normal in structure. Trivial mitral valve regurgitation. No evidence of mitral valve stenosis. Tricuspid Valve: The tricuspid valve is grossly normal. Tricuspid valve regurgitation is trivial. Aortic Valve: The aortic valve is normal in structure. Aortic valve regurgitation is not visualized. No aortic stenosis is present. Pulmonic Valve: The pulmonic valve was normal in structure. Pulmonic valve regurgitation is not visualized. No evidence of pulmonic stenosis. Aorta: The aortic root and ascending aorta are structurally normal, with  no evidence of dilitation. IAS/Shunts: The atrial septum is grossly normal.  LEFT VENTRICLE PLAX 2D LVIDd:         4.20 cm      Diastology LVIDs:  3.10 cm      LV e' lateral:   8.27 cm/s LV PW:         0.90 cm      LV E/e' lateral: 11.0 LV IVS:        0.90 cm      LV e' medial:    5.77 cm/s LVOT diam:     1.80 cm      LV E/e' medial:  15.8 LV SV:         52 LV SV Index:   27 LVOT Area:     2.54 cm  LV Volumes (MOD) LV vol d, MOD A2C: 121.0 ml LV vol d, MOD A4C: 150.0 ml LV vol s, MOD A2C: 55.3 ml LV vol s, MOD A4C: 67.2 ml LV SV MOD A2C:     65.7 ml LV SV MOD A4C:     150.0 ml LV SV MOD BP:      73.3 ml RIGHT VENTRICLE RV S prime:     9.68 cm/s TAPSE (M-mode): 2.0 cm LEFT ATRIUM             Index       RIGHT ATRIUM           Index LA diam:        4.10 cm 2.14 cm/m  RA Area:     12.50 cm LA Vol (A2C):   30.6 ml 15.98 ml/m RA Volume:   24.50 ml  12.79 ml/m LA Vol (A4C):   32.6 ml 17.02 ml/m LA Biplane Vol: 33.2 ml 17.33 ml/m  AORTIC VALVE LVOT Vmax:   106.00 cm/s LVOT Vmean:  70.700 cm/s LVOT VTI:    0.205 m  AORTA Ao Root diam: 3.10 cm MITRAL VALVE MV Area (PHT): 3.31 cm    SHUNTS MV Decel Time: 229 msec    Systemic VTI:  0.20 m MV E velocity: 91.20 cm/s  Systemic Diam: 1.80 cm MV A velocity: 94.30 cm/s MV E/A ratio:  0.97 Mertie Moores MD Electronically signed by Mertie Moores MD Signature Date/Time: 06/25/2019/3:48:58 PM    Final    Disposition   Pt is being discharged home today in good condition.  Follow-up Plans & Appointments    Follow-up Information    Tobb, Kardie, DO. Go on 07/12/2019.   Specialty: Cardiology Why: @9 :40am for hospital follow up  Contact information: [place] [address] [phone]          Discharge Instructions    Diet - low sodium heart healthy   Complete by: As directed    Discharge instructions   Complete by: As directed    No driving for 48 hours. No lifting over 5 lbs for 1 week. No sexual activity for 1 week. Keep procedure site  clean & dry. If you notice increased pain, swelling, bleeding or pus, call/return!  You may shower, but no soaking baths/hot tubs/pools for 1 week.   Increase activity slowly   Complete by: As directed       Discharge Medications   Allergies as of 06/28/2019      Reactions   Azithromycin Hives      Medication List    TAKE these medications   amLODipine 5 MG tablet Commonly known as: NORVASC Take 5 mg by mouth daily.   aspirin EC 81 MG tablet Take 1 tablet (81 mg total) by mouth daily.   furosemide 20 MG tablet Commonly known as: LASIX Take 40 mg by mouth daily as needed  for fluid.   isosorbide mononitrate 30 MG 24 hr tablet Commonly known as: IMDUR Take 1 tablet (30 mg total) by mouth daily.   nitroGLYCERIN 0.4 MG SL tablet Commonly known as: NITROSTAT Place 1 tablet (0.4 mg total) under the tongue every 5 (five) minutes as needed.   omeprazole 20 MG capsule Commonly known as: PRILOSEC Take 20 mg by mouth daily.   rosuvastatin 20 MG tablet Commonly known as: CRESTOR Take 1 tablet (20 mg total) by mouth daily.          Outstanding Labs/Studies   None  Duration of Discharge Encounter   Greater than 30 minutes including physician time.  Mahalia Longest [place], PA 06/28/2019, 1:23 PM  Patient seen and examined. Agree with assessment and plan. No recurrent chest pain. Cath images personally reviewed.  Medical therapy for CAD. EF 60 -65%. Mild anemia persists. Bradycardic with HR 57 off BB.  R radial site stable. OK for DC today; F/U with Dr. Harriet Masson.   Troy Sine, MD, [facility] 06/28/2019 1:23 PM

## 2019-06-28 NOTE — Progress Notes (Signed)
Patient given verbal discharge instructions. Paper copy of discharge summary provided to patient. RN answered all questions. IV removed. Pt VSS at discharge. Pt belongings sent with patient. Tech d/c pt via wheelchair through [employer] entrance via wheelchair.

## 2019-06-29 ENCOUNTER — Telehealth: Payer: Self-pay | Admitting: Cardiology

## 2019-06-29 NOTE — Telephone Encounter (Signed)
Patient wants to know when she can remove the dressing from her heart cath.

## 2019-06-29 NOTE — Telephone Encounter (Signed)
Patient calling back.   °

## 2019-06-29 NOTE — Telephone Encounter (Signed)
Patient had gauze dressing on right arm after radial cath on Monday 3/29. Let patient know she is cleared to removed dressing and to keep with lifting restrictions. If there any concerns of infection or abnormal irritation out of the ordinary for the site to call our office back. She agreed with plan and no additional questions at this time.

## 2019-07-05 DIAGNOSIS — R0789 Other chest pain: Secondary | ICD-10-CM | POA: Diagnosis not present

## 2019-07-05 DIAGNOSIS — D649 Anemia, unspecified: Secondary | ICD-10-CM | POA: Diagnosis not present

## 2019-07-05 DIAGNOSIS — Z09 Encounter for follow-up examination after completed treatment for conditions other than malignant neoplasm: Secondary | ICD-10-CM | POA: Diagnosis not present

## 2019-07-05 DIAGNOSIS — E119 Type 2 diabetes mellitus without complications: Secondary | ICD-10-CM | POA: Diagnosis not present

## 2019-07-12 ENCOUNTER — Other Ambulatory Visit: Payer: Self-pay

## 2019-07-12 ENCOUNTER — Encounter: Payer: Self-pay | Admitting: Cardiology

## 2019-07-12 ENCOUNTER — Ambulatory Visit: Payer: Medicare PPO | Admitting: Cardiology

## 2019-07-12 VITALS — BP 120/70 | HR 81 | Ht 65.0 in | Wt 181.0 lb

## 2019-07-12 DIAGNOSIS — I10 Essential (primary) hypertension: Secondary | ICD-10-CM | POA: Diagnosis not present

## 2019-07-12 DIAGNOSIS — I208 Other forms of angina pectoris: Secondary | ICD-10-CM | POA: Diagnosis not present

## 2019-07-12 DIAGNOSIS — E782 Mixed hyperlipidemia: Secondary | ICD-10-CM

## 2019-07-12 DIAGNOSIS — R5383 Other fatigue: Secondary | ICD-10-CM | POA: Diagnosis not present

## 2019-07-12 DIAGNOSIS — R400 Somnolence: Secondary | ICD-10-CM

## 2019-07-12 NOTE — Progress Notes (Signed)
Cardiology Office Note:    Date:  07/12/2019   ID:  Casandra Robers, DOB 06-May-1950, MRN [id number]  PCP:  Earlyne Iba, NP  Cardiologist:  Berniece Salines, DO  Electrophysiologist:  None   Referring MD: Earlyne Iba, NP   Chief Complaint  Patient presents with  . Hospitalization Follow-up   History of Present Illness:    Nilka Kolo is a 69 y.o. female with a hx of coronary artery disease, hyperlipidemia, hypertension, prediabetes presents today for follow-up visit.  Patient was admitted to [facility] on June 24, 2019 at that time she was experiencing chest pain.  She did have a cardiac catheterization on June 27, 2019 with no PCI indicated.  She is here today for follow-up visit she tells me that she has been experiencing significant fatigue and is unable to do her daily activities at home.  Past Medical History:  Diagnosis Date  . Anxiety    takes Xanax daily  . Arthritis   . Constipation    takes Colace daily  . GERD (gastroesophageal reflux disease)    takes Omeprazole daily  . History of blood transfusion    no abnormal reaction noted  . History of bronchitis 3-37yrs ago  . Hyperlipidemia    takes Lovastatin daily  . Hypertension    takes Amlodipine daily  . Joint pain   . Joint swelling   . 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 Primary osteoarthritis of left hip 05/30/2014  . Urinary frequency   . Urinary urgency     Past Surgical History:  Procedure Laterality Date  . ABDOMINAL HYSTERECTOMY  1972  . APPENDECTOMY  1969  . COLONOSCOPY    . cyst removed from ovary    . knee athroscopy Right   . knots removed from neck    . LEFT HEART CATH AND CORONARY ANGIOGRAPHY N/A 06/27/2019   Procedure: LEFT HEART CATH AND CORONARY ANGIOGRAPHY;  Surgeon: Martinique, Peter M, MD;  Location: [facility];  Service: Cardiovascular;  Laterality: N/A;  . TONSILLECTOMY  1956  . TOTAL HIP ARTHROPLASTY Left 05/30/2014   Procedure: LEFT TOTAL HIP ARTHROPLASTY ANTERIOR APPROACH;   Surgeon: Hessie Dibble, MD;  Location: [facility];  Service: Orthopedics;  Laterality: Left;  . tumors removed from arches in feet Bilateral     Current Medications: Current Meds  Medication Sig  . amLODipine (NORVASC) 5 MG tablet Take 5 mg by mouth daily.  Marland Kitchen aspirin EC 81 MG tablet Take 1 tablet (81 mg total) by mouth daily.  . furosemide (LASIX) 20 MG tablet Take 40 mg by mouth daily as needed for fluid.   . isosorbide mononitrate (IMDUR) 30 MG 24 hr tablet Take 1 tablet (30 mg total) by mouth daily.  . metFORMIN (GLUCOPHAGE-XR) 500 MG 24 hr tablet 500 mg daily.  . nitroGLYCERIN (NITROSTAT) 0.4 MG SL tablet Place 1 tablet (0.4 mg total) under the tongue every 5 (five) minutes as needed.  . rosuvastatin (CRESTOR) 20 MG tablet Take 1 tablet (20 mg total) by mouth daily.     Allergies:   Azithromycin   Social History   Socioeconomic History  . Marital status: Married    Spouse name: Not on file  . Number of children: Not on file  . Years of education: Not on file  . Highest education level: Not on file  Occupational History  . Not on file  Tobacco Use  . Smoking status: Former Smoker    Packs/day: 1.00  Years: 20.00    Pack years: 20.00    Types: Cigarettes    Quit date: 02/17/2004    Years since quitting: 15.4  . Smokeless tobacco: Never Used  . Tobacco comment: no cigs in over a week  Substance and Sexual Activity  . Alcohol use: No  . Drug use: No  . Sexual activity: Yes    Birth control/protection: Surgical  Other Topics Concern  . Not on file  Social History Narrative  . Not on file   Social Determinants of Health   Financial Resource Strain:   . Difficulty of Paying Living Expenses:   Food Insecurity:   . Worried About Charity fundraiser in the Last Year:   . Arboriculturist in the Last Year:   Transportation Needs:   . Film/video editor (Medical):   Marland Kitchen Lack of Transportation (Non-Medical):   Physical Activity:   . Days of Exercise per Week:   .  Minutes of Exercise per Session:   Stress:   . Feeling of Stress :   Social Connections:   . Frequency of Communication with Friends and Family:   . Frequency of Social Gatherings with Friends and Family:   . Attends Religious Services:   . Active Member of Clubs or Organizations:   . Attends Archivist Meetings:   Marland Kitchen Marital Status:      Family History: The patient's family history includes Cancer in her sister; Hypertension in her brother and mother; Stroke in her mother.  ROS:   Review of Systems  Constitution: Negative for decreased appetite, fever and weight gain.  HENT: Negative for congestion, ear discharge, hoarse voice and sore throat.   Eyes: Negative for discharge, redness, vision loss in right eye and visual halos.  Cardiovascular: Negative for chest pain, dyspnea on exertion, leg swelling, orthopnea and palpitations.  Respiratory: Negative for cough, hemoptysis, shortness of breath and snoring.   Endocrine: Negative for heat intolerance and polyphagia.  Hematologic/Lymphatic: Negative for bleeding problem. Does not bruise/bleed easily.  Skin: Negative for flushing, nail changes, rash and suspicious lesions.  Musculoskeletal: Negative for arthritis, joint pain, muscle cramps, myalgias, neck pain and stiffness.  Gastrointestinal: Negative for abdominal pain, bowel incontinence, diarrhea and excessive appetite.  Genitourinary: Negative for decreased libido, genital sores and incomplete emptying.  Neurological: Negative for brief paralysis, focal weakness, headaches and loss of balance.  Psychiatric/Behavioral: Negative for altered mental status, depression and suicidal ideas.  Allergic/Immunologic: Negative for HIV exposure and persistent infections.    EKGs/Labs/Other Studies Reviewed:    The following studies were reviewed today:   EKG:  None today  LHC 06/27/2019  Prox LAD to Mid LAD lesion is 35% stenosed.  2nd Diag lesion is 70% stenosed.  LPDA  lesion is 35% stenosed.  Ost RCA lesion is 25% stenosed.  The left ventricular systolic function is normal.  LV end diastolic pressure is normal.  The left ventricular ejection fraction is 55-65% by visual estimate.   1. Nonobstructive CAD 2. Normal LV function 3. Elevated LVEDP 24 mm Hg.  TTE IMPRESSIONS 06/25/19 1. Left ventricular ejection fraction, by estimation, is 60 to 65%. The  left ventricle has normal function. The left ventricle has no regional  wall motion abnormalities. Left ventricular diastolic parameters are  indeterminate.  2. Right ventricular systolic function is normal. The right ventricular  size is normal.  3. The mitral valve is normal in structure. Trivial mitral valve  regurgitation. No evidence of mitral stenosis.  4. The aortic valve is normal in structure. Aortic valve regurgitation is  not visualized. No aortic stenosis is present.   Recent Labs: 06/28/2019: BUN 14; Creatinine, Ser 0.96; Hemoglobin 9.8; Platelets 198; Potassium 4.5; Sodium 137  Recent Lipid Panel    Component Value Date/Time   CHOL 96 06/25/2019 0037   TRIG 112 06/25/2019 0037   HDL 32 (L) 06/25/2019 0037   CHOLHDL 3.0 06/25/2019 0037   VLDL 22 06/25/2019 0037   LDLCALC 42 06/25/2019 0037    Physical Exam:    VS:  BP 120/70 (BP Location: Right Arm, Patient Position: Sitting, Cuff Size: Normal)   Pulse 81   Ht 5\' 5"  (1.651 m)   Wt 181 lb (82.1 kg)   SpO2 94%   BMI 30.12 kg/m     Wt Readings from Last 3 Encounters:  07/12/19 181 lb (82.1 kg)  06/27/19 183 lb 6.8 oz (83.2 kg)  05/12/19 188 lb 8 oz (85.5 kg)     GEN: Well nourished, well developed in no acute distress HEENT: Normal NECK: No JVD; No carotid bruits LYMPHATICS: No lymphadenopathy CARDIAC: S1S2 noted,RRR, no murmurs, rubs, gallops RESPIRATORY:  Clear to auscultation without rales, wheezing or rhonchi  ABDOMEN: Soft, non-tender, non-distended, +bowel sounds, no guarding. EXTREMITIES: No edema, No  cyanosis, no clubbing MUSCULOSKELETAL:  No deformity  SKIN: Warm and dry NEUROLOGIC:  Alert and oriented x 3, non-focal PSYCHIATRIC:  Normal affect, good insight  ASSESSMENT:    1. Stable angina (HCC)   2. Essential hypertension   3. Fatigue, unspecified type   4. Daytime somnolence   5. Mixed hyperlipidemia    PLAN:    1.  She still experiencing significant fatigue, new onset of daytime somnolence.  I like to get the patient to undergo sleep study to make sure obstructive sleep apnea is not playing a role here.  Did speak to her about this and she is agreeable to proceed with this testing.  2.  In terms of her coronary artery disease with chronic stable angina her recent cath did not show any obstructive disease and no PCI was indicated.  I am going to continue the patient on her current medication regimen which includes aspirin 81 mg daily, amlodipine 5 mg daily, Imdur 30 mg daily, Crestor 20 mg daily.  I do believe that she can benefit from cardiac rehab. I am going to refer the patient to [facility] cardiac rehab.  3.  Hypertension-her blood pressure is acceptable in the office today no changes will be made to her antihypertensive medication regimen.  4.  Hyperlipidemia continue patient on her Crestor 20 mg daily.  The patient is in agreement with the above plan. The patient left the office in stable condition.  The patient will follow up in 3 months or sooner if needed.   Medication Adjustments/Labs and Tests Ordered: Current medicines are reviewed at length with the patient today.  Concerns regarding medicines are outlined above.  Orders Placed This Encounter  Procedures  . AMB referral to cardiac rehabilitation  . Split night study   No orders of the defined types were placed in this encounter.   Patient Instructions  Medication Instructions:  Your physician recommends that you continue on your current medications as directed. Please refer to the Current  Medication list given to you today.  *If you need a refill on your cardiac medications before your next appointment, please call your pharmacy*   Lab Work: None ordered  If you have labs (blood  work) drawn today and your tests are completely normal, you will receive your results only by: Marland Kitchen MyChart Message (if you have MyChart) OR . A paper copy in the mail If you have any lab test that is abnormal or we need to change your treatment, we will call you to review the results.   Testing/Procedures: Your physician has recommended that you have a sleep study.  SOMEONE WILL CALL YOU TO ARRANGE THIS ONCE THEY GET AUTHORIZATION FROM YOUR INSURANCE PROVIDER.  This test records several body functions during sleep, including: brain activity, eye movement, oxygen and carbon dioxide blood levels, heart rate and rhythm, breathing rate and rhythm, the flow of air through your mouth and nose, snoring, body muscle movements, and chest and belly movement.   You have been referred to [facility].  THEY WILL CALL YOU WITH AN APPOINTMENT.    Follow-Up: At [facility], you and your health needs are our priority.  As part of our continuing mission to provide you with exceptional heart care, we have created designated Provider Care Teams.  These Care Teams include your primary Cardiologist (physician) and Advanced Practice Providers (APPs -  Physician Assistants and Nurse Practitioners) who all work together to provide you with the care you need, when you need it.  We recommend signing up for the patient portal called "MyChart".  Sign up information is provided on this After Visit Summary.  MyChart is used to connect with patients for Virtual Visits (Telemedicine).  Patients are able to view lab/test results, encounter notes, upcoming appointments, etc.  Non-urgent messages can be sent to your provider as well.   To learn more about what you can do with MyChart, go to  [email or web address].    Your next appointment:   3 month(s)  The format for your next appointment:   In Person  Provider:   Berniece Salines, DO   Other Instructions      Adopting a Healthy Lifestyle.  Know what a healthy weight is for you (roughly BMI <25) and aim to maintain this   Aim for 7+ servings of fruits and vegetables daily   65-80+ fluid ounces of water or unsweet tea for healthy kidneys   Limit to max 1 drink of alcohol per day; avoid smoking/tobacco   Limit animal fats in diet for cholesterol and heart health - choose grass fed whenever available   Avoid highly processed foods, and foods high in saturated/trans fats   Aim for low stress - take time to unwind and care for your mental health   Aim for 150 min of moderate intensity exercise weekly for heart health, and weights twice weekly for bone health   Aim for 7-9 hours of sleep daily   When it comes to diets, agreement about the perfect plan isnt easy to find, even among the experts. Experts at the [employer] developed an idea known as the Healthy Eating Plate. Just imagine a plate divided into logical, healthy portions.   The emphasis is on diet quality:   Load up on vegetables and fruits - one-half of your plate: Aim for color and variety, and remember that potatoes dont count.   Go for whole grains - one-quarter of your plate: Whole wheat, barley, wheat berries, quinoa, oats, brown rice, and foods made with them. If you want pasta, go with whole wheat pasta.   Protein power - one-quarter of your plate: Fish, chicken, beans, and nuts are all healthy, versatile  protein sources. Limit red meat.   The diet, however, does go beyond the plate, offering a few other suggestions.   Use healthy plant oils, such as olive, canola, soy, corn, sunflower and peanut. Check the labels, and avoid partially hydrogenated oil, which have unhealthy trans fats.   If youre thirsty, drink water.  Coffee and tea are good in moderation, but skip sugary drinks and limit milk and dairy products to one or two daily servings.   The type of carbohydrate in the diet is more important than the amount. Some sources of carbohydrates, such as vegetables, fruits, whole grains, and beans-are healthier than others.   Finally, stay active  Signed, Berniece Salines, DO  07/12/2019 10:09 PM    [facility]

## 2019-07-12 NOTE — Patient Instructions (Signed)
Medication Instructions:  Your physician recommends that you continue on your current medications as directed. Please refer to the Current Medication list given to you today.  *If you need a refill on your cardiac medications before your next appointment, please call your pharmacy*   Lab Work: None ordered  If you have labs (blood work) drawn today and your tests are completely normal, you will receive your results only by: Marland Kitchen MyChart Message (if you have MyChart) OR . A paper copy in the mail If you have any lab test that is abnormal or we need to change your treatment, we will call you to review the results.   Testing/Procedures: Your physician has recommended that you have a sleep study.  SOMEONE WILL CALL YOU TO ARRANGE THIS ONCE THEY GET AUTHORIZATION FROM YOUR INSURANCE PROVIDER.  This test records several body functions during sleep, including: brain activity, eye movement, oxygen and carbon dioxide blood levels, heart rate and rhythm, breathing rate and rhythm, the flow of air through your mouth and nose, snoring, body muscle movements, and chest and belly movement.   You have been referred to [facility].  THEY WILL CALL YOU WITH AN APPOINTMENT.    Follow-Up: At [facility], you and your health needs are our priority.  As part of our continuing mission to provide you with exceptional heart care, we have created designated Provider Care Teams.  These Care Teams include your primary Cardiologist (physician) and Advanced Practice Providers (APPs -  Physician Assistants and Nurse Practitioners) who all work together to provide you with the care you need, when you need it.  We recommend signing up for the patient portal called "MyChart".  Sign up information is provided on this After Visit Summary.  MyChart is used to connect with patients for Virtual Visits (Telemedicine).  Patients are able to view lab/test results, encounter notes, upcoming appointments,  etc.  Non-urgent messages can be sent to your provider as well.   To learn more about what you can do with MyChart, go to [email or web address].    Your next appointment:   3 month(s)  The format for your next appointment:   In Person  Provider:   Berniece Salines, DO   Other Instructions

## 2019-07-21 DIAGNOSIS — R944 Abnormal results of kidney function studies: Secondary | ICD-10-CM | POA: Diagnosis not present

## 2019-08-02 ENCOUNTER — Ambulatory Visit: Payer: Medicare PPO | Admitting: Cardiology

## 2019-08-08 ENCOUNTER — Telehealth: Payer: Self-pay

## 2019-08-08 NOTE — Telephone Encounter (Signed)
Preauthorization received from [facility] ([email or web address] for 95811 and 95810 sleep study.  Auth# [id number], DOS Aug 08, 2019-June 9,2021.   Request forwarded to Gershon Cull for scheduling.

## 2019-08-09 ENCOUNTER — Ambulatory Visit: Payer: Medicare PPO

## 2019-08-09 ENCOUNTER — Telehealth: Payer: Self-pay | Admitting: *Deleted

## 2019-08-09 NOTE — Telephone Encounter (Signed)
-----   Message from Chapman Moss, RN sent at 08/08/2019  1:32 PM EDT ----- Regarding: FW: SLEEP STUDY  Preauthorization received from [facility] ([email or web address] for 95811 and 95810 sleep study.  Auth# [id number], DOS Aug 08, 2019-June 9,2021.   Request forwarded to Gershon Cull for scheduling.  ----- Message ----- From: Jeanann Lewandowsky, RMA Sent: 07/12/2019   9:55 AM EDT To: Cv Div Sleep Studies Subject: SLEEP STUDY                                    PT NEEDS TO BE SCHEDULED FOR A SLEEP STUDY AND THEN FOLLOW UP WITH DR. Claiborne Billings PER DR. Harriet Masson

## 2019-08-09 NOTE — Telephone Encounter (Signed)
Patient is scheduled for lab study on 08/23/19.Marland Kitchen pt is scheduled for COVID screening on 08/20/19 11:05 prior to SS.  Patient understands her sleep study will be done at [facility] sleep lab. Patient understands she will receive a sleep packet in a week or so. Patient understands to call if she does not receive the sleep packet in a timely manner. Patient agrees with treatment and thanked me for call.

## 2019-08-20 ENCOUNTER — Other Ambulatory Visit (HOSPITAL_COMMUNITY)
Admission: RE | Admit: 2019-08-20 | Discharge: 2019-08-20 | Disposition: A | Discharge status: Home / Self Care | Payer: Medicare PPO | Source: Ambulatory Visit | Attending: Cardiovascular Disease | Admitting: Cardiovascular Disease

## 2019-08-20 DIAGNOSIS — Z20822 Contact with and (suspected) exposure to covid-19: Secondary | ICD-10-CM | POA: Insufficient documentation

## 2019-08-20 DIAGNOSIS — Z01812 Encounter for preprocedural laboratory examination: Secondary | ICD-10-CM | POA: Diagnosis not present

## 2019-08-20 LAB — SARS CORONAVIRUS 2 (TAT 6-24 HRS): SARS Coronavirus 2: NEGATIVE

## 2019-08-23 ENCOUNTER — Ambulatory Visit (HOSPITAL_BASED_OUTPATIENT_CLINIC_OR_DEPARTMENT_OTHER): Payer: Medicare PPO | Attending: Cardiology | Admitting: Cardiovascular Disease

## 2019-08-23 ENCOUNTER — Other Ambulatory Visit: Payer: Self-pay

## 2019-08-23 VITALS — Temp 97.2°F | Ht 64.0 in | Wt 175.0 lb

## 2019-08-23 DIAGNOSIS — G4733 Obstructive sleep apnea (adult) (pediatric): Secondary | ICD-10-CM | POA: Diagnosis not present

## 2019-08-23 DIAGNOSIS — R400 Somnolence: Secondary | ICD-10-CM | POA: Diagnosis not present

## 2019-08-23 DIAGNOSIS — Z7984 Long term (current) use of oral hypoglycemic drugs: Secondary | ICD-10-CM | POA: Insufficient documentation

## 2019-08-23 DIAGNOSIS — Z7982 Long term (current) use of aspirin: Secondary | ICD-10-CM | POA: Insufficient documentation

## 2019-08-23 DIAGNOSIS — R5383 Other fatigue: Secondary | ICD-10-CM | POA: Diagnosis present

## 2019-08-23 DIAGNOSIS — Z79899 Other long term (current) drug therapy: Secondary | ICD-10-CM | POA: Insufficient documentation

## 2019-09-08 DIAGNOSIS — E785 Hyperlipidemia, unspecified: Secondary | ICD-10-CM | POA: Diagnosis not present

## 2019-09-08 DIAGNOSIS — E119 Type 2 diabetes mellitus without complications: Secondary | ICD-10-CM | POA: Diagnosis not present

## 2019-09-08 DIAGNOSIS — E538 Deficiency of other specified B group vitamins: Secondary | ICD-10-CM | POA: Diagnosis not present

## 2019-09-08 DIAGNOSIS — E559 Vitamin D deficiency, unspecified: Secondary | ICD-10-CM | POA: Diagnosis not present

## 2019-09-08 DIAGNOSIS — I10 Essential (primary) hypertension: Secondary | ICD-10-CM | POA: Diagnosis not present

## 2019-09-08 DIAGNOSIS — K219 Gastro-esophageal reflux disease without esophagitis: Secondary | ICD-10-CM | POA: Diagnosis not present

## 2019-09-10 ENCOUNTER — Encounter (HOSPITAL_BASED_OUTPATIENT_CLINIC_OR_DEPARTMENT_OTHER): Payer: Self-pay | Admitting: Cardiovascular Disease

## 2019-09-10 NOTE — Procedures (Signed)
Patient Name: Karen Baker, Peery Study Date: 08/23/2019 Gender: Female D.O.B: 06/15/1950 Age (years): 41 Referring Provider: Godfrey Pick Tobb DO Height (inches): 64 Interpreting Physician: Shelva Majestic MD, [facility] Weight (lbs): 175 RPSGT: Carolin Coy BMI: 30 MRN: [id number] Neck Size: 15.50  CLINICAL INFORMATION The patient is referred for a split night study with BPAP.  MEDICATIONS amLODipine (NORVASC) 5 MG tablet aspirin EC 81 MG tablet furosemide (LASIX) 20 MG tablet isosorbide mononitrate (IMDUR) 30 MG 24 hr tablet(Expired) metFORMIN (GLUCOPHAGE-XR) 500 MG 24 hr tablet nitroGLYCERIN (NITROSTAT) 0.4 MG SL tablet(Expired) rosuvastatin (CRESTOR) 20 MG tablet(Expired)  Medications self-administered by patient taken the night of the study : N/A  SLEEP STUDY TECHNIQUE As per the AASM Manual for the Scoring of Sleep and Associated Events v2.3 (April 2016) with a hypopnea requiring 4% desaturations.  The channels recorded and monitored were frontal, central and occipital EEG, electrooculogram (EOG), submentalis EMG (chin), nasal and oral airflow, thoracic and abdominal wall motion, anterior tibialis EMG, snore microphone, electrocardiogram, and pulse oximetry. Bi-level positive airway pressure (BiPAP) was initiated when the patient met split night criteria and was titrated according to treat sleep-disordered breathing.  RESPIRATORY PARAMETERS Diagnostic Total AHI (/hr): 37.1 RDI (/hr): 48.6 OA Index (/hr): 0.5 CA Index (/hr): 0.0 REM AHI (/hr): 102.9 NREM AHI (/hr): 31.3 Supine AHI (/hr): 37.1 Non-supine AHI (/hr): N/A Min O2 Sat (%): 73.0 Mean O2 (%): 89.2 Time below 88% (min): 40.6   Titration Optimal IPAP Pressure (cm): 21 Optimal EPAP Pressure (cm): 17 AHI at Optimal Pressure (/hr): 79.5 Min O2 at Optimal Pressure (%): 94.0 Sleep % at Optimal (%): 9 Supine % at Optimal (%): 100   SLEEP ARCHITECTURE The study was initiated at 10:01:31 PM and terminated at 5:10:47 AM. The  total recorded time was 429.3 minutes. EEG confirmed total sleep time was 310.5 minutes yielding a sleep efficiency of 72.3%%. Sleep onset after lights out was 12.7 minutes with a REM latency of 152.5 minutes. The patient spent 11.6%% of the night in stage N1 sleep, 73.4%% in stage N2 sleep, 0.0%% in stage N3 and 15% in REM. Wake after sleep onset (WASO) was 106.1 minutes. The Arousal Index was 27.2/hour.  LEG MOVEMENT DATA The total Periodic Limb Movements of Sleep (PLMS) were 0. The PLMS index was 0.0 .  CARDIAC DATA The 2 lead EKG demonstrated sinus rhythm. The mean heart rate was 100.0 beats per minute. Other EKG findings include: None.  IMPRESSIONS - Severe obstructive sleep apnea occurred during the diagnostic portion of the study (AHI 37.1 /h; RDI 48.6/h) with very severe sleep apnea during REM sleep (AHI 102.9/h).  CPAP was initiated at 5 cm, titrated to 17 and transitioned to BIPAP at 21/17.  - No significant central sleep apnea occurred during the diagnostic portion of the study (CAI = 0.0/hour). - Severe oxygen desaturation was noted during the diagnostic portion of the study to a nadir of 73.0%. - The patient snored with moderate snoring volume during the diagnostic portion of the study. - No cardiac abnormalities were noted during this study. - Clinically significant periodic limb movements of sleep did not occur during the study.  DIAGNOSIS - Obstructive Sleep Apnea (327.23 [G47.33 ICD-10])  RECOMMENDATIONS - Recommend an initial trial of BiPAP Auto therapy with EPAP min at 14, PS of 4 and IPAP max of 25 cm of water with heated humidification. A Medium size Resmed Full Face Mask AirFit F30 mask was used for the titration. - Effort should be made to optimize nasal  and oropharyngeal patency. - Avoid alcohol, sedatives and other CNS depressants that may worsen sleep apnea and disrupt normal sleep architecture. - Sleep hygiene should be reviewed to assess factors that may improve  sleep quality. - Weight management and regular exercise should be initiated or continued. - Recommend a download in 30 days and sleep clinic evaluation after 4 weeks of therapy.  [Electronically signed] 09/10/2019 01:37 PM  Shelva Majestic MD, [facility], [place], American Board of Sleep Medicine   NPI: [id number] [place] PH: [phone]   FX: [phone] [place]

## 2019-09-15 DIAGNOSIS — N289 Disorder of kidney and ureter, unspecified: Secondary | ICD-10-CM | POA: Diagnosis not present

## 2019-09-27 DIAGNOSIS — R05 Cough: Secondary | ICD-10-CM | POA: Diagnosis not present

## 2019-09-27 DIAGNOSIS — J329 Chronic sinusitis, unspecified: Secondary | ICD-10-CM | POA: Diagnosis not present

## 2019-09-27 DIAGNOSIS — D649 Anemia, unspecified: Secondary | ICD-10-CM | POA: Diagnosis not present

## 2019-09-27 DIAGNOSIS — I10 Essential (primary) hypertension: Secondary | ICD-10-CM | POA: Diagnosis not present

## 2019-09-27 DIAGNOSIS — J90 Pleural effusion, not elsewhere classified: Secondary | ICD-10-CM | POA: Diagnosis not present

## 2019-09-27 DIAGNOSIS — J189 Pneumonia, unspecified organism: Secondary | ICD-10-CM | POA: Diagnosis not present

## 2019-09-27 DIAGNOSIS — R109 Unspecified abdominal pain: Secondary | ICD-10-CM | POA: Diagnosis not present

## 2019-09-28 DIAGNOSIS — J189 Pneumonia, unspecified organism: Secondary | ICD-10-CM | POA: Diagnosis not present

## 2019-09-30 DIAGNOSIS — I700 Atherosclerosis of aorta: Secondary | ICD-10-CM | POA: Diagnosis not present

## 2019-09-30 DIAGNOSIS — N281 Cyst of kidney, acquired: Secondary | ICD-10-CM | POA: Diagnosis not present

## 2019-09-30 DIAGNOSIS — K4090 Unilateral inguinal hernia, without obstruction or gangrene, not specified as recurrent: Secondary | ICD-10-CM | POA: Diagnosis not present

## 2019-09-30 DIAGNOSIS — J189 Pneumonia, unspecified organism: Secondary | ICD-10-CM | POA: Diagnosis not present

## 2019-09-30 DIAGNOSIS — K5730 Diverticulosis of large intestine without perforation or abscess without bleeding: Secondary | ICD-10-CM | POA: Diagnosis not present

## 2019-09-30 DIAGNOSIS — R109 Unspecified abdominal pain: Secondary | ICD-10-CM | POA: Diagnosis not present

## 2019-10-06 ENCOUNTER — Telehealth: Payer: Self-pay | Admitting: *Deleted

## 2019-10-06 DIAGNOSIS — J189 Pneumonia, unspecified organism: Secondary | ICD-10-CM | POA: Diagnosis not present

## 2019-10-06 NOTE — Telephone Encounter (Signed)
Informed patient of sleep study results and patient understanding was verbalized. Patient understands her sleep study showed:   IMPRESSIONS  - Severe obstructive sleep apnea occurred during the diagnostic portion of the study (AHI 37.1 /h; RDI 48.6/h) with very severe sleep apnea during REM sleep (AHI 102.9/h).  CPAP was initiated at 5 cm, titrated to 17 and transitioned to BIPAP at 21/17.  - No significant central sleep apnea occurred during the diagnostic portion of the study (CAI = 0.0/hour). - Severe oxygen desaturation was noted during the diagnostic portion of the study to a nadir of 73.0%. - The patient snored with moderate snoring volume during the diagnostic portion of the study. - No cardiac abnormalities were noted during this study. - Clinically significant periodic limb movements of sleep did not occur during the study.  DIAGNOSIS - Obstructive Sleep Apnea (327.23 [G47.33 ICD-10])  RECOMMENDATIONS - Recommend an initial trial of BiPAP Auto therapy with EPAP min at 14, PS of 4 and IPAP max of 25 cm of water with heated humidification. A Medium size Resmed Full Face Mask AirFit F30 mask was used for the titration.  DME selection is [facility]. Patient understands she will be contacted by [facility] to set up her cpap. Patient understands to call if [facility] does not contact her with new setup in a timely manner. Patient understands they will be called once confirmation has been received from [facility] that they have received their new machine to schedule 10 week follow up appointment.  [facility] notified of new cpap order  Please add to airview Patient was grateful for the call and thanked me.

## 2019-10-06 NOTE — Telephone Encounter (Signed)
BIPAP order faxed to [facility].

## 2019-10-10 DIAGNOSIS — G4733 Obstructive sleep apnea (adult) (pediatric): Secondary | ICD-10-CM | POA: Diagnosis not present

## 2019-10-11 ENCOUNTER — Other Ambulatory Visit: Payer: Self-pay

## 2019-10-11 ENCOUNTER — Ambulatory Visit: Payer: Medicare PPO | Admitting: Cardiology

## 2019-10-11 ENCOUNTER — Encounter: Payer: Self-pay | Admitting: Cardiology

## 2019-10-11 VITALS — BP 136/70 | HR 69 | Ht 64.0 in | Wt 170.6 lb

## 2019-10-11 DIAGNOSIS — I25119 Atherosclerotic heart disease of native coronary artery with unspecified angina pectoris: Secondary | ICD-10-CM

## 2019-10-11 DIAGNOSIS — G4733 Obstructive sleep apnea (adult) (pediatric): Secondary | ICD-10-CM | POA: Insufficient documentation

## 2019-10-11 DIAGNOSIS — I10 Essential (primary) hypertension: Secondary | ICD-10-CM

## 2019-10-11 DIAGNOSIS — E782 Mixed hyperlipidemia: Secondary | ICD-10-CM | POA: Diagnosis not present

## 2019-10-11 DIAGNOSIS — E118 Type 2 diabetes mellitus with unspecified complications: Secondary | ICD-10-CM | POA: Diagnosis not present

## 2019-10-11 HISTORY — DX: Obstructive sleep apnea (adult) (pediatric): G47.33

## 2019-10-11 NOTE — Progress Notes (Signed)
Cardiology Office Note:    Date:  10/11/2019   ID:  Karen Baker, DOB September 21, 1950, MRN [id number]  PCP:  Earlyne Iba, NP  Cardiologist:  Berniece Salines, DO  Electrophysiologist:  None   Referring MD: Earlyne Iba, NP   " I am doing all right"  History of Present Illness:    Karen Baker is a 69 y.o. female with a hx of coronary artery disease, hyperlipidemia, hypertension, prediabetes presents today for follow-up visit.  Patient was admitted to [facility] on June 24, 2019 at that time she was experiencing chest pain. She did have a cardiac catheterization on June 27, 2019 with no PCI indicated.   During her last visit I recommended the patient get a sleep study.  Was able to undergo a sleep study on Aug 23, 2019-she was diagnosed with severe sleep apnea and was recommended to start BiPAP at 21/17.  She just had her BiPAP set up yesterday with her first night.  She offers no complaints at this time.  Past Medical History:  Diagnosis Date  . Anxiety    takes Xanax daily  . Arthritis   . Constipation    takes Colace daily  . GERD (gastroesophageal reflux disease)    takes Omeprazole daily  . History of blood transfusion    no abnormal reaction noted  . History of bronchitis 3-13yrs ago  . Hyperlipidemia    takes Lovastatin daily  . Hypertension    takes Amlodipine daily  . Joint pain   . Joint swelling   . 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 Primary osteoarthritis of left hip 05/30/2014  . Urinary frequency   . Urinary urgency     Past Surgical History:  Procedure Laterality Date  . ABDOMINAL HYSTERECTOMY  1972  . APPENDECTOMY  1969  . COLONOSCOPY    . cyst removed from ovary    . knee athroscopy Right   . knots removed from neck    . LEFT HEART CATH AND CORONARY ANGIOGRAPHY N/A 06/27/2019   Procedure: LEFT HEART CATH AND CORONARY ANGIOGRAPHY;  Surgeon: Martinique, Peter M, MD;  Location: [facility];  Service: Cardiovascular;  Laterality: N/A;  .  TONSILLECTOMY  1956  . TOTAL HIP ARTHROPLASTY Left 05/30/2014   Procedure: LEFT TOTAL HIP ARTHROPLASTY ANTERIOR APPROACH;  Surgeon: Hessie Dibble, MD;  Location: [facility];  Service: Orthopedics;  Laterality: Left;  . tumors removed from arches in feet Bilateral     Current Medications: Current Meds  Medication Sig  . amLODipine (NORVASC) 5 MG tablet Take 5 mg by mouth daily.  Marland Kitchen aspirin EC 81 MG tablet Take 1 tablet (81 mg total) by mouth daily.  . furosemide (LASIX) 20 MG tablet Take 40 mg by mouth daily as needed for fluid.   . metFORMIN (GLUCOPHAGE-XR) 500 MG 24 hr tablet 500 mg daily.     Allergies:   Azithromycin   Social History   Socioeconomic History  . Marital status: Married    Spouse name: Not on file  . Number of children: Not on file  . Years of education: Not on file  . Highest education level: Not on file  Occupational History  . Not on file  Tobacco Use  . Smoking status: Former Smoker    Packs/day: 1.00    Years: 20.00    Pack years: 20.00    Types: Cigarettes    Quit date: 02/17/2004    Years since quitting: 15.6  . Smokeless tobacco: Never  Used  . Tobacco comment: no cigs in over a week  Substance and Sexual Activity  . Alcohol use: No  . Drug use: No  . Sexual activity: Yes    Birth control/protection: Surgical  Other Topics Concern  . Not on file  Social History Narrative  . Not on file   Social Determinants of Health   Financial Resource Strain:   . Difficulty of Paying Living Expenses:   Food Insecurity:   . Worried About Charity fundraiser in the Last Year:   . Arboriculturist in the Last Year:   Transportation Needs:   . Film/video editor (Medical):   Marland Kitchen Lack of Transportation (Non-Medical):   Physical Activity:   . Days of Exercise per Week:   . Minutes of Exercise per Session:   Stress:   . Feeling of Stress :   Social Connections:   . Frequency of Communication with Friends and Family:   . Frequency of Social Gatherings  with Friends and Family:   . Attends Religious Services:   . Active Member of Clubs or Organizations:   . Attends Archivist Meetings:   Marland Kitchen Marital Status:      Family History: The patient's family history includes Cancer in her sister; Hypertension in her brother and mother; Stroke in her mother.  ROS:   Review of Systems  Constitution: Negative for decreased appetite, fever and weight gain.  HENT: Negative for congestion, ear discharge, hoarse voice and sore throat.   Eyes: Negative for discharge, redness, vision loss in right eye and visual halos.  Cardiovascular: Negative for chest pain, dyspnea on exertion, leg swelling, orthopnea and palpitations.  Respiratory: Negative for cough, hemoptysis, shortness of breath and snoring.   Endocrine: Negative for heat intolerance and polyphagia.  Hematologic/Lymphatic: Negative for bleeding problem. Does not bruise/bleed easily.  Skin: Negative for flushing, nail changes, rash and suspicious lesions.  Musculoskeletal: Negative for arthritis, joint pain, muscle cramps, myalgias, neck pain and stiffness.  Gastrointestinal: Negative for abdominal pain, bowel incontinence, diarrhea and excessive appetite.  Genitourinary: Negative for decreased libido, genital sores and incomplete emptying.  Neurological: Negative for brief paralysis, focal weakness, headaches and loss of balance.  Psychiatric/Behavioral: Negative for altered mental status, depression and suicidal ideas.  Allergic/Immunologic: Negative for HIV exposure and persistent infections.    EKGs/Labs/Other Studies Reviewed:    The following studies were reviewed today:   EKG: None today  Recent Labs: 06/28/2019: BUN 14; Creatinine, Ser 0.96; Hemoglobin 9.8; Platelets 198; Potassium 4.5; Sodium 137  Recent Lipid Panel    Component Value Date/Time   CHOL 96 06/25/2019 0037   TRIG 112 06/25/2019 0037   HDL 32 (L) 06/25/2019 0037   CHOLHDL 3.0 06/25/2019 0037   VLDL 22  06/25/2019 0037   LDLCALC 42 06/25/2019 0037    Physical Exam:    VS:  BP 136/70   Pulse 69   Ht 5\' 4"  (1.626 m)   Wt 170 lb 9.6 oz (77.4 kg)   SpO2 97%   BMI 29.28 kg/m     Wt Readings from Last 3 Encounters:  10/11/19 170 lb 9.6 oz (77.4 kg)  08/23/19 175 lb (79.4 kg)  07/12/19 181 lb (82.1 kg)     GEN: Well nourished, well developed in no acute distress HEENT: Normal NECK: No JVD; No carotid bruits LYMPHATICS: No lymphadenopathy CARDIAC: S1S2 noted,RRR, no murmurs, rubs, gallops RESPIRATORY:  Clear to auscultation without rales, wheezing or rhonchi  ABDOMEN: Soft, non-tender,  non-distended, +bowel sounds, no guarding. EXTREMITIES: No edema, No cyanosis, no clubbing MUSCULOSKELETAL:  No deformity  SKIN: Warm and dry NEUROLOGIC:  Alert and oriented x 3, non-focal PSYCHIATRIC:  Normal affect, good insight  ASSESSMENT:    1. Coronary artery disease involving native coronary artery of native heart with angina pectoris ([place])   2. Essential hypertension   3. Type 2 diabetes mellitus with complication, without long-term current use of insulin ([place])   4. Mixed hyperlipidemia   5. Obstructive sleep apnea syndrome, severe    PLAN:     1.  He is to be stable from a cardiovascular standpoint.  No changes will be made to her medication regimen.  Her blood pressures acceptable in the office.  There is no symptoms of angina.  2.  She was recently diagnosed with severe sleep apnea she has been started on BiPAP I am hoping this will help the patient clinically.  3.  Hyperlipidemia-continue current statin medication.  4.  Diabetes mellitus continue medication per PCP  The patient is in agreement with the above plan. The patient left the office in stable condition.  The patient will follow up in 6 months or sooner if needed.   Medication Adjustments/Labs and Tests Ordered: Current medicines are reviewed at length with the patient today.  Concerns regarding medicines are  outlined above.  No orders of the defined types were placed in this encounter.  No orders of the defined types were placed in this encounter.   There are no Patient Instructions on file for this visit.   Adopting a Healthy Lifestyle.  Know what a healthy weight is for you (roughly BMI <25) and aim to maintain this   Aim for 7+ servings of fruits and vegetables daily   65-80+ fluid ounces of water or unsweet tea for healthy kidneys   Limit to max 1 drink of alcohol per day; avoid smoking/tobacco   Limit animal fats in diet for cholesterol and heart health - choose grass fed whenever available   Avoid highly processed foods, and foods high in saturated/trans fats   Aim for low stress - take time to unwind and care for your mental health   Aim for 150 min of moderate intensity exercise weekly for heart health, and weights twice weekly for bone health   Aim for 7-9 hours of sleep daily   When it comes to diets, agreement about the perfect plan isnt easy to find, even among the experts. Experts at the [employer] developed an idea known as the Healthy Eating Plate. Just imagine a plate divided into logical, healthy portions.   The emphasis is on diet quality:   Load up on vegetables and fruits - one-half of your plate: Aim for color and variety, and remember that potatoes dont count.   Go for whole grains - one-quarter of your plate: Whole wheat, barley, wheat berries, quinoa, oats, brown rice, and foods made with them. If you want pasta, go with whole wheat pasta.   Protein power - one-quarter of your plate: Fish, chicken, beans, and nuts are all healthy, versatile protein sources. Limit red meat.   The diet, however, does go beyond the plate, offering a few other suggestions.   Use healthy plant oils, such as olive, canola, soy, corn, sunflower and peanut. Check the labels, and avoid partially hydrogenated oil, which have unhealthy trans fats.   If youre  thirsty, drink water. Coffee and tea are good in moderation, but skip sugary  drinks and limit milk and dairy products to one or two daily servings.   The type of carbohydrate in the diet is more important than the amount. Some sources of carbohydrates, such as vegetables, fruits, whole grains, and beans-are healthier than others.   Finally, stay active  Signed, Berniece Salines, DO  10/11/2019 11:04 AM    [place]

## 2019-10-11 NOTE — Patient Instructions (Signed)

## 2019-10-18 ENCOUNTER — Other Ambulatory Visit: Payer: Self-pay | Admitting: Cardiology

## 2019-10-18 DIAGNOSIS — N281 Cyst of kidney, acquired: Secondary | ICD-10-CM | POA: Diagnosis not present

## 2019-10-18 DIAGNOSIS — I700 Atherosclerosis of aorta: Secondary | ICD-10-CM | POA: Diagnosis not present

## 2019-10-18 DIAGNOSIS — K449 Diaphragmatic hernia without obstruction or gangrene: Secondary | ICD-10-CM | POA: Diagnosis not present

## 2019-10-18 DIAGNOSIS — R918 Other nonspecific abnormal finding of lung field: Secondary | ICD-10-CM | POA: Diagnosis not present

## 2019-10-18 DIAGNOSIS — N2889 Other specified disorders of kidney and ureter: Secondary | ICD-10-CM | POA: Diagnosis not present

## 2019-10-18 DIAGNOSIS — I2510 Atherosclerotic heart disease of native coronary artery without angina pectoris: Secondary | ICD-10-CM | POA: Diagnosis not present

## 2019-10-18 DIAGNOSIS — Q600 Renal agenesis, unilateral: Secondary | ICD-10-CM | POA: Diagnosis not present

## 2019-10-18 DIAGNOSIS — J181 Lobar pneumonia, unspecified organism: Secondary | ICD-10-CM | POA: Diagnosis not present

## 2019-10-18 DIAGNOSIS — D1771 Benign lipomatous neoplasm of kidney: Secondary | ICD-10-CM | POA: Diagnosis not present

## 2019-10-18 NOTE — Telephone Encounter (Signed)
Rx refill sent to pharmacy. Called pt to verify she was taking this medication. She stated she was and I let her know it was not on her med list last time she came in. Pt said she would update her med list

## 2019-10-26 DIAGNOSIS — K219 Gastro-esophageal reflux disease without esophagitis: Secondary | ICD-10-CM | POA: Diagnosis not present

## 2019-11-03 DIAGNOSIS — K8020 Calculus of gallbladder without cholecystitis without obstruction: Secondary | ICD-10-CM | POA: Diagnosis not present

## 2019-11-03 DIAGNOSIS — K449 Diaphragmatic hernia without obstruction or gangrene: Secondary | ICD-10-CM | POA: Diagnosis not present

## 2019-11-03 DIAGNOSIS — K7689 Other specified diseases of liver: Secondary | ICD-10-CM | POA: Diagnosis not present

## 2019-11-03 DIAGNOSIS — N281 Cyst of kidney, acquired: Secondary | ICD-10-CM | POA: Diagnosis not present

## 2019-11-03 DIAGNOSIS — R93421 Abnormal radiologic findings on diagnostic imaging of right kidney: Secondary | ICD-10-CM | POA: Diagnosis not present

## 2019-11-10 DIAGNOSIS — G4733 Obstructive sleep apnea (adult) (pediatric): Secondary | ICD-10-CM | POA: Diagnosis not present

## 2019-11-25 DIAGNOSIS — Z1152 Encounter for screening for COVID-19: Secondary | ICD-10-CM | POA: Diagnosis not present

## 2019-11-25 DIAGNOSIS — Z20828 Contact with and (suspected) exposure to other viral communicable diseases: Secondary | ICD-10-CM | POA: Diagnosis not present

## 2019-11-28 ENCOUNTER — Ambulatory Visit (INDEPENDENT_AMBULATORY_CARE_PROVIDER_SITE_OTHER): Payer: Medicare PPO | Admitting: Cardiovascular Disease

## 2019-11-28 ENCOUNTER — Other Ambulatory Visit: Payer: Self-pay

## 2019-11-28 ENCOUNTER — Encounter: Payer: Self-pay | Admitting: Cardiovascular Disease

## 2019-11-28 VITALS — BP 120/84 | HR 76 | Ht 65.0 in | Wt 175.0 lb

## 2019-11-28 DIAGNOSIS — G4719 Other hypersomnia: Secondary | ICD-10-CM

## 2019-11-28 DIAGNOSIS — G4733 Obstructive sleep apnea (adult) (pediatric): Secondary | ICD-10-CM | POA: Diagnosis not present

## 2019-11-28 DIAGNOSIS — I10 Essential (primary) hypertension: Secondary | ICD-10-CM

## 2019-11-28 DIAGNOSIS — I2510 Atherosclerotic heart disease of native coronary artery without angina pectoris: Secondary | ICD-10-CM | POA: Diagnosis not present

## 2019-11-28 DIAGNOSIS — R7303 Prediabetes: Secondary | ICD-10-CM

## 2019-11-28 DIAGNOSIS — E785 Hyperlipidemia, unspecified: Secondary | ICD-10-CM

## 2019-11-28 DIAGNOSIS — I452 Bifascicular block: Secondary | ICD-10-CM | POA: Diagnosis not present

## 2019-11-28 NOTE — Patient Instructions (Signed)

## 2019-11-28 NOTE — Progress Notes (Signed)
Cardiology Office Note    Date:  12/04/2019   ID:  Karen Baker, DOB 08-Oct-1950, MRN [id number]  PCP:  Earlyne Iba, NP  Cardiologist:  Shelva Majestic, MD (sleep); Dr. Godfrey Pick Tobb  No evaluation for sleep apnea  History of Present Illness:  Karen Baker is a 69 y.o. female who is followed by Dr. Meliton Rattan for her cardiology care.  Karen Baker has a history of CAD, hyperlipidemia, hypertension, and prediabetes mellitus.  Karen Baker had reported symptoms of chest pain and coronary CTA in January 2021 suggested a hemodynamically significant lesion in the proximal PDA arising from a large dominant left circumflex vessel.  An echo Doppler study showed an EF that was normal.  Karen Baker underwent cardiac catheterization and was found to have mild 35% proximal LAD stenosis, 70% narrowing in a small second diagonal vessel, and 35% stenosis in the PDA branch of the circumflex with 25% ostial RCA narrowing for which medical therapy was recommended.  Due to symptoms highly suggestive of sleep apnea Karen Baker underwent a sleep study which was done on Aug 23, 2019.  This was a sleep study in the diagnostic portion revealed severe sleep apnea with an AHI of 37.1 and RDI 48.6.  Karen Baker had very severe sleep apnea during REM sleep with an AHI of 102.9.  There was significant oxygen desaturation to a nadir of 73%.  Karen Baker underwent CPAP titration but due to continued events ultimately was transitioned to BiPAP therapy which was titrated up to 21/17.  BiPAP auto therapy with an EPAP minimum of 14, pressure support of 4, and IPAP maximum 25 cm of water was initially recommended.  Karen Baker was started on BiPAP therapy on October 06, 2019 with [facility] as her DME company.  A download was obtained from July 31 through November 27, 2019 which shows 100% compliance with use and Karen Baker is averaging 6 hours and 7 minutes of usage per night.  Her 95th percentile pressure is 21.1/17.1 with a maximum average pressure of 21.6/17.6.  AHI is 6.5.  Karen Baker  typically goes to bed between 1030 and 11 at night and wakes up at 7 AM.  Prior to CPAP therapy Karen Baker had experienced nocturia at least 4 times per night and had frequent awakenings with nonrestorative sleep and daytime sleepiness.  Karen Baker cares for her husband with Parkinson's disease.  Since initiating CPAP her sleep has improved.  Her nocturia has been reduced to only 1 time per night.  Karen Baker is more alert.  An Epworth Sleepiness Scale score was calculated today however and this still endorsed at 12 suggesting some residual daytime sleepiness.  Karen Baker denies any bruxism.  Karen Baker denies any hypnagogic hallucinations or cataplectic events.  Karen Baker presents for evaluation.   Past Medical History:  Diagnosis Date  . Anxiety    takes Xanax daily  . Arthritis   . Constipation    takes Colace daily  . GERD (gastroesophageal reflux disease)    takes Omeprazole daily  . History of blood transfusion    no abnormal reaction noted  . History of bronchitis 3-8yrs ago  . Hyperlipidemia    takes Lovastatin daily  . Hypertension    takes Amlodipine daily  . Joint pain   . Joint swelling   . 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 Primary osteoarthritis of left hip 05/30/2014  . Urinary frequency   . Urinary urgency     Past Surgical History:  Procedure Laterality Date  . ABDOMINAL HYSTERECTOMY  1972  . APPENDECTOMY  [place]    . cyst removed from ovary    . knee athroscopy Right   . knots removed from neck    . LEFT HEART CATH AND CORONARY ANGIOGRAPHY N/A 06/27/2019   Procedure: LEFT HEART CATH AND CORONARY ANGIOGRAPHY;  Surgeon: Martinique, Peter M, MD;  Location: [facility];  Service: Cardiovascular;  Laterality: N/A;  . TONSILLECTOMY  1956  . TOTAL HIP ARTHROPLASTY Left 05/30/2014   Procedure: LEFT TOTAL HIP ARTHROPLASTY ANTERIOR APPROACH;  Surgeon: Hessie Dibble, MD;  Location: [facility];  Service: Orthopedics;  Laterality: Left;  . tumors removed from arches in feet Bilateral     Current  Medications: Outpatient Medications Prior to Visit  Medication Sig Dispense Refill  . amLODipine (NORVASC) 5 MG tablet Take 5 mg by mouth daily.    Marland Kitchen aspirin EC 81 MG tablet Take 1 tablet (81 mg total) by mouth daily. 90 tablet 3  . furosemide (LASIX) 20 MG tablet Take 40 mg by mouth daily as needed for fluid.     . isosorbide mononitrate (IMDUR) 30 MG 24 hr tablet TAKE 1 TABLET BY MOUTH EVERY DAY 90 tablet 1  . metFORMIN (GLUCOPHAGE-XR) 500 MG 24 hr tablet 500 mg daily.    . pantoprazole (PROTONIX) 40 MG tablet     . nitroGLYCERIN (NITROSTAT) 0.4 MG SL tablet Place 1 tablet (0.4 mg total) under the tongue every 5 (five) minutes as needed. 30 tablet 3  . rosuvastatin (CRESTOR) 20 MG tablet Take 1 tablet (20 mg total) by mouth daily. 90 tablet 3   No facility-administered medications prior to visit.     Allergies:   Azithromycin   Social History   Socioeconomic History  . Marital status: Married    Spouse name: Not on file  . Number of children: Not on file  . Years of education: Not on file  . Highest education level: Not on file  Occupational History  . Not on file  Tobacco Use  . Smoking status: Former Smoker    Packs/day: 1.00    Years: 20.00    Pack years: 20.00    Types: Cigarettes    Quit date: 02/17/2004    Years since quitting: 15.8  . Smokeless tobacco: Never Used  . Tobacco comment: no cigs in over a week  Substance and Sexual Activity  . Alcohol use: No  . Drug use: No  . Sexual activity: Yes    Birth control/protection: Surgical  Other Topics Concern  . Not on file  Social History Narrative  . Not on file   Social Determinants of Health   Financial Resource Strain:   . Difficulty of Paying Living Expenses: Not on file  Food Insecurity:   . Worried About Charity fundraiser in the Last Year: Not on file  . Ran Out of Food in the Last Year: Not on file  Transportation Needs:   . Lack of Transportation (Medical): Not on file  . Lack of Transportation  (Non-Medical): Not on file  Physical Activity:   . Days of Exercise per Week: Not on file  . Minutes of Exercise per Session: Not on file  Stress:   . Feeling of Stress : Not on file  Social Connections:   . Frequency of Communication with Friends and Family: Not on file  . Frequency of Social Gatherings with Friends and Family: Not on file  . Attends Religious Services: Not on file  . Active Member of Clubs or  Organizations: Not on file  . Attends Archivist Meetings: Not on file  . Marital Status: Not on file    Socially Karen Baker is married for 25 years and caring for her husband with Parkinson's disease.  Karen Baker has 2 children ages 40 and 17 and 2 grandchildren ages 45 and 10 and 97 great grandchild age 31-1/2 years.  Family History:  The patient's family history includes Cancer in her sister; Hypertension in her brother and mother; Stroke in her mother.  Family history is notable that her mother died at age 23 and had a stroke.  Father died of uncertain age and cause when Karen Baker was young.  ROS General: Negative; No fevers, chills, or night sweats;  HEENT: Negative; No changes in vision or hearing, sinus congestion, difficulty swallowing Pulmonary: Negative; No cough, wheezing, shortness of breath, hemoptysis Cardiovascular: Positive for hypertension, mild CAD, hyperlipidemia GI: Negative; No nausea, vomiting, diarrhea, or abdominal pain GU: Negative; No dysuria, hematuria, or difficulty voiding Musculoskeletal: Negative; no myalgias, joint pain, or weakness Hematologic/Oncology: Negative; no easy bruising, bleeding Endocrine: Positive for prediabetes, no thyroid disease Neuro: Negative; no changes in balance, headaches Skin: Negative; No rashes or skin lesions Psychiatric: Negative; No behavioral problems, depression Sleep: Positive for severe sleep apnea, see HPI Other comprehensive 14 point system review is negative.   PHYSICAL EXAM:   VS:  BP 120/84   Pulse 76   Ht 5\' 5"   (1.651 m)   Wt 175 lb (79.4 kg)   SpO2 99%   BMI 29.12 kg/m     Repeat blood pressure by me was 130/80  Wt Readings from Last 3 Encounters:  11/28/19 175 lb (79.4 kg)  10/11/19 170 lb 9.6 oz (77.4 kg)  08/23/19 175 lb (79.4 kg)    General: Alert, oriented, no distress.  Skin: normal turgor, no rashes, warm and dry HEENT: Normocephalic, atraumatic. Pupils equal round and reactive to light; sclera anicteric; extraocular muscles intact;  Nose without nasal septal hypertrophy Mouth/Parynx benign; Mallinpatti scale 3 Neck: No JVD, no carotid bruits; normal carotid upstroke Lungs: clear to ausculatation and percussion; no wheezing or rales Chest wall: without tenderness to palpitation Heart: PMI not displaced, RRR, s1 s2 normal, 1/6 systolic murmur, no diastolic murmur, no rubs, gallops, thrills, or heaves Abdomen: soft, nontender; no hepatosplenomehaly, BS+; abdominal aorta nontender and not dilated by palpation. Back: no CVA tenderness Pulses 2+ Musculoskeletal: full range of motion, normal strength, no joint deformities Extremities: no clubbing cyanosis or edema, Homan's sign negative  Neurologic: grossly nonfocal; Cranial nerves grossly wnl Psychologic: Normal mood and affect   Studies/Labs Reviewed:   EKG:  EKG is not ordered today.  I personally reviewed the ECG from June 26, 2019 which showed normal sinus rhythm at 60 bpm with bifascicular block (left anterior hemiblock and right bundle branch block.  No ectopy.  Recent Labs: BMP Latest Ref Rng & Units 06/28/2019 06/26/2019 06/25/2019  Glucose 70 - 99 mg/dL 125(H) 120(H) 132(H)  BUN 8 - 23 mg/dL 14 13 11   Creatinine 0.44 - 1.00 mg/dL 0.96 1.01(H) 0.99  BUN/Creat Ratio 12 - 28 - - -  Sodium 135 - 145 mmol/L 137 138 135  Potassium 3.5 - 5.1 mmol/L 4.5 4.0 2.8(L)  Chloride 98 - 111 mmol/L 104 103 98  CO2 22 - 32 mmol/L 27 27 26   Calcium 8.9 - 10.3 mg/dL 10.1 10.0 10.3     No flowsheet data found.  CBC Latest Ref Rng &  Units 06/28/2019 06/27/2019 06/26/2019  WBC 4.0 - 10.5 K/uL 6.7 8.6 7.7  Hemoglobin 12.0 - 15.0 g/dL 9.8(L) 9.9(L) 10.4(L)  Hematocrit 36 - 46 % 31.0(L) 30.8(L) 32.1(L)  Platelets 150 - 400 K/uL 198 199 211   Lab Results  Component Value Date   MCV 84.5 06/28/2019   MCV 83.5 06/27/2019   MCV 82.7 06/26/2019   No results found for: TSH Lab Results  Component Value Date   HGBA1C 6.7 (H) 06/25/2019     BNP No results found for: BNP  ProBNP No results found for: PROBNP   Lipid Panel     Component Value Date/Time   CHOL 96 06/25/2019 0037   TRIG 112 06/25/2019 0037   HDL 32 (L) 06/25/2019 0037   CHOLHDL 3.0 06/25/2019 0037   VLDL 22 06/25/2019 0037   LDLCALC 42 06/25/2019 0037     RADIOLOGY: No results found.   Additional studies/ records that were reviewed today include:   SPLIT NIGHT PROTOCOL: 08/23/2019 RESPIRATORY PARAMETERS Diagnostic Total AHI (/hr):            37.1     RDI (/hr):         48.6     OA Index (/hr):            0.5       CA Index (/hr):      0.0 REM AHI (/hr):            102.9   NREM AHI (/hr):          31.3     Supine AHI (/hr):         37.1     Non-supine AHI (/hr):        N/A Min O2 Sat (%):          73.0     Mean O2 (%):  89.2     Time below 88% (min):           40.6       Titration Optimal IPAP Pressure (cm): 21        Optimal EPAP Pressure (cm):            17        AHI at Optimal Pressure (/hr):          79.5     Min O2 at Optimal Pressure (%):       94.0 Sleep % at Optimal (%):         9          Supine % at Optimal (%):       100        SLEEP ARCHITECTURE The study was initiated at 10:01:31 PM and terminated at 5:10:47 AM. The total recorded time was 429.3 minutes. EEG confirmed total sleep time was 310.5 minutes yielding a sleep efficiency of 72.3%%. Sleep onset after lights out was 12.7 minutes with a REM latency of 152.5 minutes. The patient spent 11.6%% of the night in stage N1 sleep, 73.4%% in stage N2 sleep, 0.0%% in stage N3 and 15%  in REM. Wake after sleep onset (WASO) was 106.1 minutes. The Arousal Index was 27.2/hour.  LEG MOVEMENT DATA The total Periodic Limb Movements of Sleep (PLMS) were 0. The PLMS index was 0.0 .  CARDIAC DATA The 2 lead EKG demonstrated sinus rhythm. The mean heart rate was 100.0 beats per minute. Other EKG findings include: None.  IMPRESSIONS - Severe obstructive sleep apnea occurred during the diagnostic portion of the study (AHI  37.1 /h; RDI 48.6/h) with very severe sleep apnea during REM sleep (AHI 102.9/h).  CPAP was initiated at 5 cm, titrated to 17 and transitioned to BIPAP at 21/17.  - No significant central sleep apnea occurred during the diagnostic portion of the study (CAI = 0.0/hour). - Severe oxygen desaturation was noted during the diagnostic portion of the study to a nadir of 73.0%. - The patient snored with moderate snoring volume during the diagnostic portion of the study. - No cardiac abnormalities were noted during this study. - Clinically significant periodic limb movements of sleep did not occur during the study.  DIAGNOSIS - Obstructive Sleep Apnea (327.23 [G47.33 ICD-10])  RECOMMENDATIONS - Recommend an initial trial of BiPAP Auto therapy with EPAP min at 14, PS of 4 and IPAP max of 25 cm of water with heated humidification. A Medium size Resmed Full Face Mask AirFit F30 mask was used for the titration. - Effort should be made to optimize nasal and oropharyngeal patency. - Avoid alcohol, sedatives and other CNS depressants that may worsen sleep apnea and disrupt normal sleep architecture. - Sleep hygiene should be reviewed to assess factors that may improve sleep quality. - Weight management and regular exercise should be initiated or continued. - Recommend a download in 30 days and sleep clinic evaluation after 4 weeks of therapy.  [Electronically signed] 09/10/2019 01:37 PM   ASSESSMENT:    1. OSA (obstructive sleep apnea)   2. Excessive daytime sleepiness    3. CAD in native artery   4. Essential hypertension   5. Hyperlipidemia with target LDL less than 70   6. Bifascicular block   7. Prediabetes     PLAN:  This Karen Baker is a 69 year old female who has a history of cardiovascular comorbidities including mild hyperlipidemia, hypertension and prediabetes mellitus.  I reviewed her cardiac catheterization data from March 2021 which revealed mild not significantly obstructive CAD for which medical therapy was recommended.  Karen Baker had significant symptoms of sleep apnea including snoring, daytime sleepiness, nonrestorative sleep, frequent nocturia at least 4 times per night and ultimately a sleep study confirmed the diagnosis of severe sleep apnea with an overall AHI of 37.1, RDI 48.6/h but during REM sleep AHI was 102.9/h.  Karen Baker had significant oxygen desaturation to 73%.  I had an extensive discussion with her today and reviewed normal sleep architecture as well as the effects of sleep apnea inducing abnormal sleep architecture.  We discussed cardiovascular ramifications including hypertension with difficult control particularly with rem related AHI, nocturnal arrhythmias with potential for atrial fibrillation, and particularly with significant nocturnal hypoxemia risk for nocturnal ischemia both cardiac as well as cerebrovascular only.  I also discussed its implications and glucose control and GERD.  Karen Baker required BiPAP therapy to overcome her severe sleep apnea and was titrated to a high pressure.  Her most recent download confirms excellent compliance and her average 95th percentile pressure is 21.1/17.1.  AHI is still mildly increased at 6.5.  Karen Baker has been set at a minimum EPAP pressure of 14 with a maximum IPAP pressure of 25.  Presently I will increase her minimum EPAP pressure up to 16.  I am reducing her ramp time from 30 minutes down to 15 minutes.  Her nocturia has significantly improved from 4 times per night down to 1 per night and I reviewed the  pathophysiology associated with increased nocturia associated with sleep apnea.  Karen Baker is meeting compliance standards.  I answered all her questions.  Karen Baker does not have  any significant mask leak with her current mask.  As long as Karen Baker remains stable I will see her in 1 year per Medicare mandate for a face-to-face evaluation or sooner as needed.   Medication Adjustments/Labs and Tests Ordered: Current medicines are reviewed at length with the patient today.  Concerns regarding medicines are outlined above.  Medication changes, Labs and Tests ordered today are listed in the Patient Instructions below. Patient Instructions  Medication Instructions:  CONTINUE WITH CURRENT MEDICATIONS. NO CHANGES.  *If you need a refill on your cardiac medications before your next appointment, please call your pharmacy*    Follow-Up: At [facility], you and your health needs are our priority.  As part of our continuing mission to provide you with exceptional heart care, we have created designated Provider Care Teams.  These Care Teams include your primary Cardiologist (physician) and Advanced Practice Providers (APPs -  Physician Assistants and Nurse Practitioners) who all work together to provide you with the care you need, when you need it.  We recommend signing up for the patient portal called "MyChart".  Sign up information is provided on this After Visit Summary.  MyChart is used to connect with patients for Virtual Visits (Telemedicine).  Patients are able to view lab/test results, encounter notes, upcoming appointments, etc.  Non-urgent messages can be sent to your provider as well.   To learn more about what you can do with MyChart, go to [email or web address].    Your next appointment:   12 month(s)  The format for your next appointment:   In Person  Provider:   Shelva Majestic, MD        Signed, Shelva Majestic, MD  12/04/2019 4:38 PM    [facility] [address]  [place], [place], [place]  [address] Phone: [phone]

## 2019-12-02 DIAGNOSIS — K222 Esophageal obstruction: Secondary | ICD-10-CM | POA: Diagnosis not present

## 2019-12-02 DIAGNOSIS — K219 Gastro-esophageal reflux disease without esophagitis: Secondary | ICD-10-CM | POA: Diagnosis not present

## 2019-12-02 DIAGNOSIS — I10 Essential (primary) hypertension: Secondary | ICD-10-CM | POA: Diagnosis not present

## 2019-12-02 DIAGNOSIS — Z862 Personal history of diseases of the blood and blood-forming organs and certain disorders involving the immune mechanism: Secondary | ICD-10-CM | POA: Diagnosis not present

## 2019-12-02 DIAGNOSIS — E119 Type 2 diabetes mellitus without complications: Secondary | ICD-10-CM | POA: Diagnosis not present

## 2019-12-02 DIAGNOSIS — K449 Diaphragmatic hernia without obstruction or gangrene: Secondary | ICD-10-CM | POA: Diagnosis not present

## 2019-12-02 DIAGNOSIS — Z8379 Family history of other diseases of the digestive system: Secondary | ICD-10-CM | POA: Diagnosis not present

## 2019-12-04 ENCOUNTER — Encounter: Payer: Self-pay | Admitting: Cardiovascular Disease

## 2019-12-08 DIAGNOSIS — H2513 Age-related nuclear cataract, bilateral: Secondary | ICD-10-CM | POA: Diagnosis not present

## 2019-12-08 DIAGNOSIS — H52203 Unspecified astigmatism, bilateral: Secondary | ICD-10-CM | POA: Diagnosis not present

## 2019-12-08 DIAGNOSIS — E119 Type 2 diabetes mellitus without complications: Secondary | ICD-10-CM | POA: Diagnosis not present

## 2019-12-08 DIAGNOSIS — H524 Presbyopia: Secondary | ICD-10-CM | POA: Diagnosis not present

## 2019-12-09 DIAGNOSIS — E538 Deficiency of other specified B group vitamins: Secondary | ICD-10-CM | POA: Diagnosis not present

## 2019-12-09 DIAGNOSIS — E559 Vitamin D deficiency, unspecified: Secondary | ICD-10-CM | POA: Diagnosis not present

## 2019-12-09 DIAGNOSIS — Z8616 Personal history of COVID-19: Secondary | ICD-10-CM | POA: Diagnosis not present

## 2019-12-09 DIAGNOSIS — I10 Essential (primary) hypertension: Secondary | ICD-10-CM | POA: Diagnosis not present

## 2019-12-09 DIAGNOSIS — I700 Atherosclerosis of aorta: Secondary | ICD-10-CM | POA: Diagnosis not present

## 2019-12-09 DIAGNOSIS — K219 Gastro-esophageal reflux disease without esophagitis: Secondary | ICD-10-CM | POA: Diagnosis not present

## 2019-12-09 DIAGNOSIS — E785 Hyperlipidemia, unspecified: Secondary | ICD-10-CM | POA: Diagnosis not present

## 2019-12-09 DIAGNOSIS — E118 Type 2 diabetes mellitus with unspecified complications: Secondary | ICD-10-CM | POA: Diagnosis not present

## 2019-12-11 DIAGNOSIS — G4733 Obstructive sleep apnea (adult) (pediatric): Secondary | ICD-10-CM | POA: Diagnosis not present

## 2019-12-23 DIAGNOSIS — H2513 Age-related nuclear cataract, bilateral: Secondary | ICD-10-CM | POA: Diagnosis not present

## 2020-01-10 DIAGNOSIS — G4733 Obstructive sleep apnea (adult) (pediatric): Secondary | ICD-10-CM | POA: Diagnosis not present

## 2020-01-23 DIAGNOSIS — G4733 Obstructive sleep apnea (adult) (pediatric): Secondary | ICD-10-CM | POA: Diagnosis not present

## 2020-01-26 ENCOUNTER — Telehealth: Payer: Self-pay | Admitting: Cardiology

## 2020-01-26 NOTE — Telephone Encounter (Signed)
01/26/20 Office note and Echo faxed to [employer] @ [facility]/jsa

## 2020-01-31 DIAGNOSIS — H25811 Combined forms of age-related cataract, right eye: Secondary | ICD-10-CM | POA: Diagnosis not present

## 2020-01-31 DIAGNOSIS — H2511 Age-related nuclear cataract, right eye: Secondary | ICD-10-CM | POA: Diagnosis not present

## 2020-02-06 DIAGNOSIS — M1611 Unilateral primary osteoarthritis, right hip: Secondary | ICD-10-CM | POA: Diagnosis not present

## 2020-02-06 DIAGNOSIS — M1711 Unilateral primary osteoarthritis, right knee: Secondary | ICD-10-CM | POA: Diagnosis not present

## 2020-02-10 DIAGNOSIS — G4733 Obstructive sleep apnea (adult) (pediatric): Secondary | ICD-10-CM | POA: Diagnosis not present

## 2020-02-10 DIAGNOSIS — Z23 Encounter for immunization: Secondary | ICD-10-CM | POA: Diagnosis not present

## 2020-03-07 ENCOUNTER — Telehealth: Payer: Self-pay | Admitting: Cardiology

## 2020-03-07 ENCOUNTER — Other Ambulatory Visit: Payer: Self-pay | Admitting: Orthopaedic Surgery

## 2020-03-07 DIAGNOSIS — Z01818 Encounter for other preprocedural examination: Secondary | ICD-10-CM

## 2020-03-07 NOTE — Telephone Encounter (Signed)
   [facility] Pre-operative Risk Assessment    HEARTCARE STAFF: - Please ensure there is not already an duplicate clearance open for this procedure. - Under Visit Info/Reason for Call, type in Other and utilize the format Clearance MM/DD/YY or Clearance TBD. Do not use dashes or single digits. - If request is for dental extraction, please clarify the # of teeth to be extracted.  Request for surgical clearance:  1. What type of surgery is being performed? Right Hip Arthroplasty   2. When is this surgery scheduled? 04/03/20   3. What type of clearance is required (medical clearance vs. Pharmacy clearance to hold med vs. Both)? Both   4. Are there any medications that need to be held prior to surgery and how long? Aspirin 5-7 days prior the rest TBD by Cardiologist   5. Practice name and name of physician performing surgery? [facility], Dr. Rhona Raider  6. What is the office phone number? [phone]   7.   What is the office fax number? [phone]  8.   Anesthesia type (None, local, MAC, general) ? Spinal    Trilby Drummer 03/07/2020, 3:07 PM  _________________________________________________________________   (provider comments below)

## 2020-03-07 NOTE — Telephone Encounter (Signed)
Left message to call back and ask to speak with pre-op team.  Darreld Mclean, PA-C 03/07/2020 4:19 PM

## 2020-03-09 NOTE — Telephone Encounter (Signed)
   Primary Cardiologist: Berniece Salines, DO  Chart reviewed as part of pre-operative protocol coverage. Patient was last seen by Dr. Harriet Masson in 09/2019. Patient was contacted today for further pre-op evaluation and reported doing well from a cardiac standpoint since last visit. She denies any chest pain. She notes some shortness of breath with more vigorous activities but not with routine activities. She states this is chronic and stable. No shortness of breath at rest. No orthopnea, PND, or edema. No significantt palpitations, dizziness, syncope. Activity is limited some by hip pain but she is able to complete >4.0 METS. Per Revised Caridac Risk Index, considered low risk. Given past medical history and time since last visit, based on ACC/AHA guidelines, Karen Baker would be at acceptable risk for the planned procedure without further cardiovascular testing.   Patient has non-obstructive CAD. No history of MI, PCI, or stroke. Therefore, OK to hold Aspirin for 5-7 days prior to procedure if needed. Would restart this as soon as possible postoperatively.   The patient was advised that if she develops new symptoms prior to surgery to contact our office to arrange for a follow-up visit, and she verbalized understanding.  I will route this recommendation to the requesting party via Epic fax function and remove from pre-op pool.  Please call with questions.  Darreld Mclean, PA-C 03/09/2020, 3:12 PM

## 2020-03-11 DIAGNOSIS — G4733 Obstructive sleep apnea (adult) (pediatric): Secondary | ICD-10-CM | POA: Diagnosis not present

## 2020-03-13 DIAGNOSIS — E559 Vitamin D deficiency, unspecified: Secondary | ICD-10-CM | POA: Diagnosis not present

## 2020-03-13 DIAGNOSIS — E785 Hyperlipidemia, unspecified: Secondary | ICD-10-CM | POA: Diagnosis not present

## 2020-03-13 DIAGNOSIS — L989 Disorder of the skin and subcutaneous tissue, unspecified: Secondary | ICD-10-CM | POA: Diagnosis not present

## 2020-03-13 DIAGNOSIS — Z01818 Encounter for other preprocedural examination: Secondary | ICD-10-CM | POA: Diagnosis not present

## 2020-03-13 DIAGNOSIS — E538 Deficiency of other specified B group vitamins: Secondary | ICD-10-CM | POA: Diagnosis not present

## 2020-03-13 DIAGNOSIS — E118 Type 2 diabetes mellitus with unspecified complications: Secondary | ICD-10-CM | POA: Diagnosis not present

## 2020-03-13 DIAGNOSIS — Z6833 Body mass index (BMI) 33.0-33.9, adult: Secondary | ICD-10-CM | POA: Diagnosis not present

## 2020-03-13 DIAGNOSIS — I10 Essential (primary) hypertension: Secondary | ICD-10-CM | POA: Diagnosis not present

## 2020-03-20 DIAGNOSIS — H25812 Combined forms of age-related cataract, left eye: Secondary | ICD-10-CM | POA: Diagnosis not present

## 2020-03-20 DIAGNOSIS — H2512 Age-related nuclear cataract, left eye: Secondary | ICD-10-CM | POA: Diagnosis not present

## 2020-03-22 ENCOUNTER — Ambulatory Visit (HOSPITAL_COMMUNITY)
Admission: RE | Admit: 2020-03-22 | Discharge: 2020-03-22 | Disposition: A | Discharge status: Home / Self Care | Payer: Medicare PPO | Source: Ambulatory Visit | Attending: Orthopaedic Surgery | Admitting: Orthopaedic Surgery

## 2020-03-22 ENCOUNTER — Encounter (HOSPITAL_COMMUNITY): Payer: Self-pay

## 2020-03-22 ENCOUNTER — Encounter (HOSPITAL_COMMUNITY)
Admission: RE | Admit: 2020-03-22 | Discharge: 2020-03-22 | Disposition: A | Discharge status: Home / Self Care | Payer: Medicare PPO | Source: Ambulatory Visit | Attending: Orthopaedic Surgery | Admitting: Orthopaedic Surgery

## 2020-03-22 ENCOUNTER — Other Ambulatory Visit: Payer: Self-pay

## 2020-03-22 DIAGNOSIS — Z01818 Encounter for other preprocedural examination: Secondary | ICD-10-CM

## 2020-03-22 HISTORY — DX: Sleep apnea, unspecified: G47.30

## 2020-03-22 HISTORY — DX: Prediabetes: R73.03

## 2020-03-22 HISTORY — DX: Pneumonia, unspecified organism: J18.9

## 2020-03-22 HISTORY — DX: Dyspnea, unspecified: R06.00

## 2020-03-22 LAB — BASIC METABOLIC PANEL
Anion gap: 10 (ref 5–15)
BUN: 21 mg/dL (ref 8–23)
CO2: 27 mmol/L (ref 22–32)
Calcium: 12 mg/dL — ABNORMAL HIGH (ref 8.9–10.3)
Chloride: 103 mmol/L (ref 98–111)
Creatinine, Ser: 0.93 mg/dL (ref 0.44–1.00)
GFR, Estimated: >60 mL/min (ref >60)
Glucose, Bld: 112 mg/dL — ABNORMAL HIGH (ref 70–99)
Potassium: 4.8 mmol/L (ref 3.5–5.1)
Sodium: 140 mmol/L (ref 135–145)

## 2020-03-22 LAB — URINALYSIS, ROUTINE W REFLEX MICROSCOPIC
Bilirubin Urine: NEGATIVE
Glucose, UA: NEGATIVE mg/dL
Hgb urine dipstick: NEGATIVE
Ketones, ur: NEGATIVE mg/dL
Leukocytes,Ua: NEGATIVE
Nitrite: NEGATIVE
Protein, ur: NEGATIVE mg/dL
Specific Gravity, Urine: 1.004 — ABNORMAL LOW (ref 1.005–1.030)
pH: 6 (ref 5.0–8.0)

## 2020-03-22 LAB — CBC WITH DIFFERENTIAL/PLATELET
Abs Immature Granulocytes: 0.03 10*3/uL (ref 0.00–0.07)
Basophils Absolute: 0.1 10*3/uL (ref 0.0–0.1)
Basophils Relative: 1 %
Eosinophils Absolute: 0.2 10*3/uL (ref 0.0–0.5)
Eosinophils Relative: 2 %
HCT: 34.6 % — ABNORMAL LOW (ref 36.0–46.0)
Hemoglobin: 11.8 g/dL — ABNORMAL LOW (ref 12.0–15.0)
Immature Granulocytes: 0 %
Lymphocytes Relative: 20 %
Lymphs Abs: 2 10*3/uL (ref 0.7–4.0)
MCH: 29.6 pg (ref 26.0–34.0)
MCHC: 34.1 g/dL (ref 30.0–36.0)
MCV: 86.7 fL (ref 80.0–100.0)
Monocytes Absolute: 0.8 10*3/uL (ref 0.1–1.0)
Monocytes Relative: 8 %
Neutro Abs: 6.9 10*3/uL (ref 1.7–7.7)
Neutrophils Relative %: 69 %
Platelets: 234 10*3/uL (ref 150–400)
RBC: 3.99 MIL/uL (ref 3.87–5.11)
RDW: 13.3 % (ref 11.5–15.5)
WBC: 10 10*3/uL (ref 4.0–10.5)
nRBC: 0 % (ref 0.0–0.2)

## 2020-03-22 LAB — HEMOGLOBIN A1C
Hgb A1c MFr Bld: 6.1 % — ABNORMAL HIGH (ref 4.8–5.6)
Mean Plasma Glucose: 128.37 mg/dL

## 2020-03-22 LAB — PROTIME-INR
INR: 1.1 (ref 0.8–1.2)
Prothrombin Time: 13.4 seconds (ref 11.4–15.2)

## 2020-03-22 LAB — TYPE AND SCREEN
ABO/RH(D): O POS
Antibody Screen: NEGATIVE

## 2020-03-22 LAB — SURGICAL PCR SCREEN
MRSA, PCR: NEGATIVE
Staphylococcus aureus: POSITIVE — AB

## 2020-03-22 LAB — APTT: aPTT: 35 seconds (ref 24–36)

## 2020-03-22 NOTE — Progress Notes (Addendum)
Anesthesia Review:  PCP: DR Orlinda Blalock in [place] Cardiologist : Berniece Salines- clearance- 12/18/121 Telephone note 11/28/19- Office visit , 10/11/19- Office visit  Chest x-ray :06/24/19- iview  EKG :06/26/19  Echo :06/25/2019  Stress test: Cardiac Cath : 06/17/2019  Activity level:  Sleep Study/ CPAP : Fasting Blood Sugar :      / Checks Blood Sugar -- times a day:   Blood Thinner/ Instructions /Last Dose: ASA / Instructions/ Last Dose :  BMP done 03/22/2020 routed to Dr Rhona Raider.  DR Lanetta Inch made aware taht calcium is 12.0 on bmp of 03/22/20.  Will call PCP office and let them be aware.  No new orders given.  Faxed all lab results to office of Alfonzo Feller .  And office made awre of calcium results form 03/22/20.   Checks glucose twice daily per pt .  HGBA1C done 03/22/20- 6.1  Office of Alfonzo Feller , PCP for patient has received copy of all labs done 03/22/2020 at preop at [facility].  Will notify Orlinda Blalock who is in office today that Calcium level is 12.0.  Fax confirmaton received.

## 2020-03-22 NOTE — Progress Notes (Signed)
BMP done 03/22/20 routed to DR dalldorf.

## 2020-03-22 NOTE — Progress Notes (Signed)
DUE TO COVID-19 ONLY ONE VISITOR IS ALLOWED TO COME WITH YOU AND STAY IN THE WAITING ROOM ONLY DURING PRE OP AND PROCEDURE DAY OF SURGERY. THE 1 VISITOR  MAY VISIT WITH YOU AFTER SURGERY IN YOUR PRIVATE ROOM DURING VISITING HOURS ONLY!  YOU NEED TO HAVE A COVID 19 TEST ON__1/05/2020 _____ @_______ , THIS TEST MUST BE DONE BEFORE SURGERY,  COVID TESTING SITE [address] [place] [address], IT IS ON THE RIGHT GOING OUT [address] APPROXIMATELY  2 MINUTES PAST [employer] ON THE RIGHT. ONCE YOUR COVID TEST IS COMPLETED,  PLEASE BEGIN THE QUARANTINE INSTRUCTIONS AS OUTLINED IN YOUR HANDOUT.                Karen Baker  03/22/2020   Your procedure is scheduled on:  04/03/2020   Report to [facility] Main  Entrance   Report to admitting at     0730 AM     Call this number if you have problems the morning of surgery [phone]    REMEMBER: NO  SOLID FOOD CANDY OR GUM AFTER MIDNIGHT. CLEAR LIQUIDS UNTIL0700 am          . NOTHING BY MOUTH EXCEPT CLEAR LIQUIDS UNTIL    . PLEASE FINISH ENSURE DRINK PER SURGEON ORDER  WHICH NEEDS TO BE COMPLETED AT      .  0700am     CLEAR LIQUID DIET   Foods Allowed                                                                    Coffee and tea, regular and decaf                            Fruit ices (not with fruit pulp)                                      Iced Popsicles                                    Carbonated beverages, regular and diet                                    Cranberry, grape and apple juices Sports drinks like Gatorade Lightly seasoned clear broth or consume(fat free) Sugar, honey syrup ___________________________________________________________________      BRUSH YOUR TEETH MORNING OF SURGERY AND RINSE YOUR MOUTH OUT, NO CHEWING GUM CANDY OR MINTS.     Take these medicines the morning of surgery with A SIP OF WATER: amlodipine, Imdur, Protonix   DO NOT TAKE ANY DIABETIC MEDICATIONS DAY OF  YOUR SURGERY                               You may not have any metal on your body including hair pins and              piercings  Do not wear jewelry, make-up, lotions, powders or perfumes, deodorant             Do not wear nail polish on your fingernails.  Do not shave  48 hours prior to surgery.              Men may shave face and neck.   Do not bring valuables to the hospital. [place].  Contacts, dentures or bridgework may not be worn into surgery.  Leave suitcase in the car. After surgery it may be brought to your room.     Patients discharged the day of surgery will not be allowed to drive home. IF YOU ARE HAVING SURGERY AND GOING HOME THE SAME DAY, YOU MUST HAVE AN ADULT TO DRIVE YOU HOME AND BE WITH YOU FOR 24 HOURS. YOU MAY GO HOME BY TAXI OR UBER OR ORTHERWISE, BUT AN ADULT MUST ACCOMPANY YOU HOME AND STAY WITH YOU FOR 24 HOURS.  Name and phone number of your driver:  Special Instructions: N/A              Please read over the following fact sheets you were given: _____________________________________________________________________  [facility] - Preparing for Surgery Before surgery, you can play an important role.  Because skin is not sterile, your skin needs to be as free of germs as possible.  You can reduce the number of germs on your skin by washing with CHG (chlorahexidine gluconate) soap before surgery.  CHG is an antiseptic cleaner which kills germs and bonds with the skin to continue killing germs even after washing. Please DO NOT use if you have an allergy to CHG or antibacterial soaps.  If your skin becomes reddened/irritated stop using the CHG and inform your nurse when you arrive at Short Stay. Do not shave (including legs and underarms) for at least 48 hours prior to the first CHG shower.  You may shave your face/neck. Please follow these instructions carefully:  1.  Shower with CHG Soap the night before surgery and  the  morning of Surgery.  2.  If you choose to wash your hair, wash your hair first as usual with your  normal  shampoo.  3.  After you shampoo, rinse your hair and body thoroughly to remove the  shampoo.                           4.  Use CHG as you would any other liquid soap.  You can apply chg directly  to the skin and wash                       Gently with a scrungie or clean washcloth.  5.  Apply the CHG Soap to your body ONLY FROM THE NECK DOWN.   Do not use on face/ open                           Wound or open sores. Avoid contact with eyes, ears mouth and genitals (private parts).                       Wash face,  Genitals (private parts) with your normal soap.             6.  Wash  thoroughly, paying special attention to the area where your surgery  will be performed.  7.  Thoroughly rinse your body with warm water from the neck down.  8.  DO NOT shower/wash with your normal soap after using and rinsing off  the CHG Soap.                9.  Pat yourself dry with a clean towel.            10.  Wear clean pajamas.            11.  Place clean sheets on your bed the night of your first shower and do not  sleep with pets. Day of Surgery : Do not apply any lotions/deodorants the morning of surgery.  Please wear clean clothes to the hospital/surgery center.  FAILURE TO FOLLOW THESE INSTRUCTIONS MAY RESULT IN THE CANCELLATION OF YOUR SURGERY PATIENT SIGNATURE_________________________________  NURSE SIGNATURE__________________________________  ________________________________________________________________________

## 2020-03-27 DIAGNOSIS — Z1231 Encounter for screening mammogram for malignant neoplasm of breast: Secondary | ICD-10-CM | POA: Diagnosis not present

## 2020-03-27 NOTE — Anesthesia Preprocedure Evaluation (Addendum)
Anesthesia Evaluation  Patient identified by MRN, date of birth, ID band Patient awake    Reviewed: Allergy & Precautions, NPO status , Patient's Chart, lab work & pertinent test results  Airway Mallampati: III  TM Distance: >3 FB Neck ROM: Full    Dental  (+) Missing, Poor Dentition   Pulmonary shortness of breath and with exertion, sleep apnea and Continuous Positive Airway Pressure Ventilation , former smoker,    Pulmonary exam normal breath sounds clear to auscultation       Cardiovascular hypertension, Pt. on medications + angina with exertion + CAD  Normal cardiovascular exam Rhythm:Regular Rate:Normal  ECG: Normal sinus rhythm Right bundle branch block Left anterior fascicular block Bifascicular block   CATH: Impression No impression found. Narrative Prox LAD to Mid LAD lesion is 35% stenosed. 2nd Diag lesion is 70% stenosed. LPDA lesion is 35% stenosed. Ost RCA lesion is 25% stenosed. The left ventricular systolic function is normal. LV end diastolic pressure is normal. The left ventricular ejection fraction is 55-65% by visual estimate. 1. Nonobstructive CAD 2. Normal LV function 3. Elevated LVEDP 24 mm Hg. Plan: medical management. Patient may be a candidate for DC later today.  ECHO: 1. Left ventricular ejection fraction, by estimation, is 60 to 65%. The left ventricle has normal function. The left ventricle has no regional wall motion abnormalities. Left ventricular diastolic parameters are indeterminate. 2. Right ventricular systolic function is normal. The right ventricular size is normal. 3. The mitral valve is normal in structure. Trivial mitral valve regurgitation. No evidence of mitral stenosis. 4. The aortic valve is normal in structure. Aortic valve regurgitation is not visualized. No aortic stenosis is present.   Neuro/Psych Anxiety negative neurological ROS     GI/Hepatic Neg liver ROS, GERD   Medicated and Controlled,  Endo/Other  diabetes, Oral Hypoglycemic Agents  Renal/GU negative Renal ROS     Musculoskeletal  (+) Arthritis ,   Abdominal   Peds  Hematology  (+) anemia ,   Anesthesia Other Findings  RIGHT HIP DEGENERATIVE JOINT DISEASE  Reproductive/Obstetrics                           Anesthesia Physical Anesthesia Plan  ASA: III  Anesthesia Plan: Spinal   Post-op Pain Management:    Induction:   PONV Risk Score and Plan: 2 and Ondansetron, Dexamethasone, Propofol infusion and Treatment may vary due to age or medical condition  Airway Management Planned: Simple Face Mask  Additional Equipment:   Intra-op Plan:   Post-operative Plan:   Informed Consent: I have reviewed the patients History and Physical, chart, labs and discussed the procedure including the risks, benefits and alternatives for the proposed anesthesia with the patient or authorized representative who has indicated his/her understanding and acceptance.     Dental advisory given  Plan Discussed with: CRNA  Anesthesia Plan Comments: (Per cardiology 03/09/2020, "Chart reviewed as part of pre-operative protocol coverage. Patient was last seen by Dr. Servando Salina in 09/2019. Patient was contacted today for further pre-op evaluation and reported doing well from a cardiac standpoint since last visit. She denies any chest pain. She notes some shortness of breath with more vigorous activities but not with routine activities. She states this is chronic and stable. No shortness of breath at rest. No orthopnea, PND, or edema. No significantt palpitations, dizziness, syncope. Activity is limited some by hip pain but she is able to complete >4.0 METS. Per Revised Caridac Risk Index,  considered low risk. Given past medical history and time since last visit, based on ACC/AHA guidelines, Karen Baker would be at acceptable risk for the planned procedure without further cardiovascular  testing.   Patient has non-obstructive CAD. No history of MI, PCI, or stroke. Therefore, OK to hold Aspirin for 5-7 days prior to procedure if needed. Would restart this as soon as possible postoperatively." )       Anesthesia Quick Evaluation

## 2020-03-29 DIAGNOSIS — R918 Other nonspecific abnormal finding of lung field: Secondary | ICD-10-CM | POA: Diagnosis not present

## 2020-03-29 DIAGNOSIS — I2510 Atherosclerotic heart disease of native coronary artery without angina pectoris: Secondary | ICD-10-CM | POA: Diagnosis not present

## 2020-03-29 DIAGNOSIS — I700 Atherosclerosis of aorta: Secondary | ICD-10-CM | POA: Diagnosis not present

## 2020-04-01 NOTE — H&P (Signed)
TOTAL HIP ADMISSION H&P  Patient is admitted for right total hip arthroplasty.  Subjective:  Chief Complaint: right hip pain  HPI: [facility], 70 y.o. female, has a history of pain and functional disability in the right hip(s) due to arthritis and patient has failed non-surgical conservative treatments for greater than 12 weeks to include NSAID's and/or analgesics, flexibility and strengthening excercises, supervised PT with diminished ADL's post treatment, use of assistive devices, weight reduction as appropriate and activity modification.  Onset of symptoms was gradual starting 5 years ago with gradually worsening course since that time.The patient noted no past surgery on the right hip(s).  Patient currently rates pain in the right hip at 10 out of 10 with activity. Patient has night pain, worsening of pain with activity and weight bearing, trendelenberg gait, pain that interfers with activities of daily living and crepitus. Patient has evidence of subchondral cysts, subchondral sclerosis, periarticular osteophytes and joint space narrowing by imaging studies. This condition presents safety issues increasing the risk of falls. There is no current active infection.  Patient Active Problem List   Diagnosis Date Noted  . Obstructive sleep apnea syndrome, severe 10/11/2019  . Unstable angina ([place]) 06/25/2019  . Type 2 diabetes mellitus with complication, without long-term current use of insulin ([place]) 06/25/2019  . Essential hypertension 04/29/2019  . Mixed hyperlipidemia 04/29/2019  . Coronary artery disease involving native coronary artery of native heart with angina pectoris ([place]) 04/29/2019  . 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 Primary osteoarthritis of left hip 05/30/2014   Past Medical History:  Diagnosis Date  . Anxiety    takes Xanax daily  . Arthritis   . Constipation    takes Colace daily  . Dyspnea    with exertion   . GERD (gastroesophageal reflux disease)    takes Omeprazole  daily  . History of blood transfusion    no abnormal reaction noted  . History of bronchitis 3-80yrs ago  . Hyperlipidemia    takes Lovastatin daily  . Hypertension    takes Amlodipine daily  . Joint pain   . Joint swelling   . 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 Pneumonia    hx of covid 03/2019 and then developed peumonia afterwards   . Pre-diabetes   . Primary osteoarthritis of left hip 05/30/2014  . Sleep apnea    bipap   . Urinary frequency   . Urinary urgency     Past Surgical History:  Procedure Laterality Date  . ABDOMINAL HYSTERECTOMY  1972  . APPENDECTOMY  1969  . COLONOSCOPY    . cyst removed from ovary    . knee athroscopy Right   . knots removed from neck    . LEFT HEART CATH AND CORONARY ANGIOGRAPHY N/A 06/27/2019   Procedure: LEFT HEART CATH AND CORONARY ANGIOGRAPHY;  Surgeon: Martinique, Peter M, MD;  Location: [facility];  Service: Cardiovascular;  Laterality: N/A;  . TONSILLECTOMY  1956  . TOTAL HIP ARTHROPLASTY Left 05/30/2014   Procedure: LEFT TOTAL HIP ARTHROPLASTY ANTERIOR APPROACH;  Surgeon: Hessie Dibble, MD;  Location: [facility];  Service: Orthopedics;  Laterality: Left;  . tumors removed from arches in feet Bilateral     No current facility-administered medications for this encounter.   Current Outpatient Medications  Medication Sig Dispense Refill Last Dose  . amLODipine (NORVASC) 5 MG tablet Take 5 mg by mouth daily.     Marland Kitchen aspirin EC 81 MG tablet Take 1 tablet (81 mg total) by mouth daily. [place]  tablet 3   . cholecalciferol (VITAMIN D3) 25 MCG (1000 UNIT) tablet Take 1,000 Units by mouth daily.     . isosorbide mononitrate (IMDUR) 30 MG 24 hr tablet TAKE 1 TABLET BY MOUTH EVERY DAY (Patient taking differently: Take 30 mg by mouth daily.) 90 tablet 1   . metFORMIN (GLUCOPHAGE-XR) 500 MG 24 hr tablet Take 500 mg by mouth daily with breakfast.     . nitroGLYCERIN (NITROSTAT) 0.4 MG SL tablet Place 0.4 mg under the tongue every 5 (five) minutes as needed for  chest pain.     . pantoprazole (PROTONIX) 40 MG tablet Take 40 mg by mouth daily.     . rosuvastatin (CRESTOR) 20 MG tablet Take 20 mg by mouth daily.      Allergies  Allergen Reactions  . Azithromycin Hives    Social History   Tobacco Use  . Smoking status: Former Smoker    Packs/day: 1.00    Years: 20.00    Pack years: 20.00    Types: Cigarettes    Quit date: 02/17/2004    Years since quitting: 16.1  . Smokeless tobacco: Never Used  . Tobacco comment: no cigs in over a week  Substance Use Topics  . Alcohol use: No    Family History  Problem Relation Age of Onset  . Hypertension Mother   . Stroke Mother   . Cancer Sister   . Hypertension Brother      Review of Systems  Musculoskeletal: Positive for arthralgias.       Right hip  All other systems reviewed and are negative.   Objective:  Physical Exam Constitutional:      Appearance: Normal appearance.  HENT:     Head: Normocephalic and atraumatic.     Nose: Nose normal.     Mouth/Throat:     Pharynx: Oropharynx is clear.  Eyes:     Extraocular Movements: Extraocular movements intact.  Cardiovascular:     Rate and Rhythm: Normal rate and regular rhythm.  Pulmonary:     Effort: Pulmonary effort is normal.  Abdominal:     Palpations: Abdomen is soft.  Musculoskeletal:     Cervical back: Normal range of motion.     Comments: Examination of the right hip shows significantly limited range of motion with pain at end internal range of motion testing.  She has no real tenderness palpation throughout.  Examination of the right knee shows range of motion from 0-120s of flexion.  She has tenderness to palpation mostly medially.  Her ligaments are stable.  Her calf is soft and nontender.  She is neurovascularly intact distally.  Left hip range of motion is full and without pain with a well-healed surgical incision.  Skin:    General: Skin is warm and dry.  Neurological:     General: No focal deficit present.      Mental Status: She is alert and oriented to person, place, and time.  Psychiatric:        Mood and Affect: Mood normal.        Behavior: Behavior normal.        Thought Content: Thought content normal.        Judgment: Judgment normal.     Vital signs in last 24 hours:    Labs:   Estimated body mass index is 29.12 kg/m as calculated from the following:   Height as of 03/22/20: 5\' 5"  (1.651 m).   Weight as of 11/28/19: 79.4 kg.   Imaging  Review Plain radiographs demonstrate severe degenerative joint disease of the right hip(s). The bone quality appears to be good for age and reported activity level.      Assessment/Plan:  End stage primary arthritis, right hip(s)  The patient history, physical examination, clinical judgement of the provider and imaging studies are consistent with end stage degenerative joint disease of the right hip(s) and total hip arthroplasty is deemed medically necessary. The treatment options including medical management, injection therapy, arthroscopy and arthroplasty were discussed at length. The risks and benefits of total hip arthroplasty were presented and reviewed. The risks due to aseptic loosening, infection, stiffness, dislocation/subluxation,  thromboembolic complications and other imponderables were discussed.  The patient acknowledged the explanation, agreed to proceed with the plan and consent was signed. Patient is being admitted for inpatient treatment for surgery, pain control, PT, OT, prophylactic antibiotics, VTE prophylaxis, progressive ambulation and ADL's and discharge planning.The patient is planning to be discharged home with home health services

## 2020-04-02 ENCOUNTER — Other Ambulatory Visit (HOSPITAL_COMMUNITY)
Admission: RE | Admit: 2020-04-02 | Discharge: 2020-04-02 | Disposition: A | Discharge status: Home / Self Care | Payer: Medicare PPO | Source: Ambulatory Visit | Attending: Orthopaedic Surgery | Admitting: Orthopaedic Surgery

## 2020-04-02 DIAGNOSIS — Z01812 Encounter for preprocedural laboratory examination: Secondary | ICD-10-CM | POA: Diagnosis not present

## 2020-04-02 DIAGNOSIS — Z20822 Contact with and (suspected) exposure to covid-19: Secondary | ICD-10-CM | POA: Diagnosis not present

## 2020-04-02 LAB — SARS CORONAVIRUS 2 (TAT 6-24 HRS): SARS Coronavirus 2: NEGATIVE

## 2020-04-02 MED ORDER — BUPIVACAINE LIPOSOME 1.3 % IJ SUSP
10.0000 mL | Freq: Once | INTRAMUSCULAR | Status: DC
  Filled 2020-04-02: qty 10

## 2020-04-02 MED ORDER — TRANEXAMIC ACID 1000 MG/10ML IV SOLN
2000.0000 mg | INTRAVENOUS | Status: DC
  Filled 2020-04-02: qty 20

## 2020-04-02 NOTE — Care Plan (Signed)
Ortho Bundle Case Management Note  Patient Details  Name: Karen Baker MRN: [id number] Date of Birth: 17-Jul-1950 Spoke with patient prior to surgery. She will discharge to home with family to assist. Rolling walker ordered from [employer]. OPPT set up at [facility].  Patient and MD in agreement with plan. Choice offered                     DME Arranged:  Walker rolling DME Agency:  [employer]  HH Arranged:    HH Agency:     Additional Comments: Please contact me with any questions of if this plan should need to change.  Shauna Hugh,  RN,BSN,MHA,CCM  [facility] Orthopaedic Specialist  [phone] 04/02/2020, 10:00 AM

## 2020-04-03 ENCOUNTER — Ambulatory Visit (HOSPITAL_COMMUNITY): Payer: Medicare PPO | Admitting: Anesthesiology

## 2020-04-03 ENCOUNTER — Encounter (HOSPITAL_COMMUNITY): Payer: Self-pay | Admitting: Orthopaedic Surgery

## 2020-04-03 ENCOUNTER — Ambulatory Visit (HOSPITAL_COMMUNITY): Payer: Medicare PPO

## 2020-04-03 ENCOUNTER — Ambulatory Visit (HOSPITAL_COMMUNITY)
Admission: RE | Admit: 2020-04-03 | Discharge: 2020-04-03 | Disposition: A | Discharge status: Home / Self Care | Payer: Medicare PPO | Attending: Orthopaedic Surgery | Admitting: Orthopaedic Surgery

## 2020-04-03 ENCOUNTER — Encounter (HOSPITAL_COMMUNITY)
Admission: RE | Disposition: A | Discharge status: Home / Self Care | Payer: Self-pay | Source: Home / Self Care | Attending: Orthopaedic Surgery

## 2020-04-03 DIAGNOSIS — Z881 Allergy status to other antibiotic agents status: Secondary | ICD-10-CM | POA: Diagnosis not present

## 2020-04-03 DIAGNOSIS — Z9049 Acquired absence of other specified parts of digestive tract: Secondary | ICD-10-CM | POA: Insufficient documentation

## 2020-04-03 DIAGNOSIS — E119 Type 2 diabetes mellitus without complications: Secondary | ICD-10-CM | POA: Diagnosis not present

## 2020-04-03 DIAGNOSIS — Z471 Aftercare following joint replacement surgery: Secondary | ICD-10-CM | POA: Diagnosis not present

## 2020-04-03 DIAGNOSIS — I25119 Atherosclerotic heart disease of native coronary artery with unspecified angina pectoris: Secondary | ICD-10-CM | POA: Diagnosis not present

## 2020-04-03 DIAGNOSIS — Z8616 Personal history of COVID-19: Secondary | ICD-10-CM | POA: Diagnosis not present

## 2020-04-03 DIAGNOSIS — Z7982 Long term (current) use of aspirin: Secondary | ICD-10-CM | POA: Diagnosis not present

## 2020-04-03 DIAGNOSIS — Z96641 Presence of right artificial hip joint: Secondary | ICD-10-CM | POA: Diagnosis not present

## 2020-04-03 DIAGNOSIS — M1611 Unilateral primary osteoarthritis, right hip: Secondary | ICD-10-CM | POA: Diagnosis not present

## 2020-04-03 DIAGNOSIS — Z87891 Personal history of nicotine dependence: Secondary | ICD-10-CM | POA: Diagnosis not present

## 2020-04-03 DIAGNOSIS — Z96642 Presence of left artificial hip joint: Secondary | ICD-10-CM | POA: Insufficient documentation

## 2020-04-03 DIAGNOSIS — Z7984 Long term (current) use of oral hypoglycemic drugs: Secondary | ICD-10-CM | POA: Diagnosis not present

## 2020-04-03 DIAGNOSIS — I10 Essential (primary) hypertension: Secondary | ICD-10-CM | POA: Diagnosis not present

## 2020-04-03 DIAGNOSIS — Z419 Encounter for procedure for purposes other than remedying health state, unspecified: Secondary | ICD-10-CM

## 2020-04-03 DIAGNOSIS — Z79899 Other long term (current) drug therapy: Secondary | ICD-10-CM | POA: Insufficient documentation

## 2020-04-03 DIAGNOSIS — Z90721 Acquired absence of ovaries, unilateral: Secondary | ICD-10-CM | POA: Insufficient documentation

## 2020-04-03 DIAGNOSIS — R2689 Other abnormalities of gait and mobility: Secondary | ICD-10-CM | POA: Insufficient documentation

## 2020-04-03 HISTORY — DX: Unilateral primary osteoarthritis, right hip: M16.11

## 2020-04-03 HISTORY — PX: TOTAL HIP ARTHROPLASTY: SHX124

## 2020-04-03 LAB — GLUCOSE, CAPILLARY: Glucose-Capillary: 98 mg/dL (ref 70–99)

## 2020-04-03 LAB — ABO/RH: ABO/RH(D): O POS

## 2020-04-03 SURGERY — ARTHROPLASTY, HIP, TOTAL, ANTERIOR APPROACH
Anesthesia: Spinal | Site: Hip | Laterality: Right

## 2020-04-03 MED ORDER — MIDAZOLAM HCL 5 MG/5ML IJ SOLN
INTRAMUSCULAR | Status: DC | PRN
  Administered 2020-04-03: 2 mg via INTRAVENOUS

## 2020-04-03 MED ORDER — ACETAMINOPHEN 10 MG/ML IV SOLN: 1000.0000 mg | Freq: Once | INTRAVENOUS | Status: DC | PRN

## 2020-04-03 MED ORDER — METOCLOPRAMIDE HCL 5 MG/ML IJ SOLN: 5.0000 mg | Freq: Three times a day (TID) | INTRAMUSCULAR | Status: DC | PRN

## 2020-04-03 MED ORDER — BUPIVACAINE IN DEXTROSE 0.75-8.25 % IT SOLN
INTRATHECAL | Status: DC | PRN
  Administered 2020-04-03: 1.8 mL via INTRATHECAL

## 2020-04-03 MED ORDER — CEFAZOLIN SODIUM-DEXTROSE 2-4 GM/100ML-% IV SOLN
2.0000 g | Freq: Four times a day (QID) | INTRAVENOUS | Status: DC
  Administered 2020-04-03: 2 g via INTRAVENOUS

## 2020-04-03 MED ORDER — HYDROCODONE-ACETAMINOPHEN 5-325 MG PO TABS
1.0000 | ORAL_TABLET | ORAL | Status: DC | PRN
  Administered 2020-04-03: 2 via ORAL

## 2020-04-03 MED ORDER — PROPOFOL 500 MG/50ML IV EMUL
INTRAVENOUS | Status: AC
  Filled 2020-04-03: qty 50

## 2020-04-03 MED ORDER — LACTATED RINGERS IV BOLUS
250.0000 mL | Freq: Once | INTRAVENOUS | Status: AC
  Administered 2020-04-03: 250 mL via INTRAVENOUS

## 2020-04-03 MED ORDER — TRANEXAMIC ACID-NACL 1000-0.7 MG/100ML-% IV SOLN
1000.0000 mg | Freq: Once | INTRAVENOUS | Status: AC
  Administered 2020-04-03: 1000 mg via INTRAVENOUS

## 2020-04-03 MED ORDER — CEFAZOLIN SODIUM-DEXTROSE 2-4 GM/100ML-% IV SOLN
2.0000 g | INTRAVENOUS | Status: AC
  Administered 2020-04-03: 2 g via INTRAVENOUS
  Filled 2020-04-03: qty 100

## 2020-04-03 MED ORDER — FENTANYL CITRATE (PF) 100 MCG/2ML IJ SOLN
INTRAMUSCULAR | Status: AC
  Filled 2020-04-03: qty 2

## 2020-04-03 MED ORDER — MIDAZOLAM HCL 2 MG/2ML IJ SOLN
INTRAMUSCULAR | Status: AC
  Filled 2020-04-03: qty 2

## 2020-04-03 MED ORDER — HYDROCODONE-ACETAMINOPHEN 7.5-325 MG PO TABS: 1.0000 | ORAL_TABLET | ORAL | Status: DC | PRN

## 2020-04-03 MED ORDER — HYDROCODONE-ACETAMINOPHEN 5-325 MG PO TABS
ORAL_TABLET | ORAL | Status: AC
  Filled 2020-04-03: qty 2

## 2020-04-03 MED ORDER — ALBUMIN HUMAN 5 % IV SOLN: INTRAVENOUS | Status: DC | PRN

## 2020-04-03 MED ORDER — DEXAMETHASONE SODIUM PHOSPHATE 10 MG/ML IJ SOLN
INTRAMUSCULAR | Status: DC | PRN
  Administered 2020-04-03: 5 mg via INTRAVENOUS

## 2020-04-03 MED ORDER — TRANEXAMIC ACID 1000 MG/10ML IV SOLN
INTRAVENOUS | Status: DC | PRN
  Administered 2020-04-03: 2000 mg via TOPICAL

## 2020-04-03 MED ORDER — ALBUMIN HUMAN 5 % IV SOLN
INTRAVENOUS | Status: AC
  Filled 2020-04-03: qty 250

## 2020-04-03 MED ORDER — PROPOFOL 500 MG/50ML IV EMUL
INTRAVENOUS | Status: DC | PRN
  Administered 2020-04-03: 25 ug/kg/min via INTRAVENOUS

## 2020-04-03 MED ORDER — PHENYLEPHRINE 40 MCG/ML (10ML) SYRINGE FOR IV PUSH (FOR BLOOD PRESSURE SUPPORT)
PREFILLED_SYRINGE | INTRAVENOUS | Status: DC | PRN
  Administered 2020-04-03: 40 ug via INTRAVENOUS
  Administered 2020-04-03: 80 ug via INTRAVENOUS
  Administered 2020-04-03 (×4): 40 ug via INTRAVENOUS
  Administered 2020-04-03: 80 ug via INTRAVENOUS
  Administered 2020-04-03: 40 ug via INTRAVENOUS
  Administered 2020-04-03: 80 ug via INTRAVENOUS

## 2020-04-03 MED ORDER — LACTATED RINGERS IV BOLUS
500.0000 mL | Freq: Once | INTRAVENOUS | Status: AC
  Administered 2020-04-03: 500 mL via INTRAVENOUS

## 2020-04-03 MED ORDER — FENTANYL CITRATE (PF) 100 MCG/2ML IJ SOLN
INTRAMUSCULAR | Status: DC | PRN
  Administered 2020-04-03: 50 ug via INTRAVENOUS

## 2020-04-03 MED ORDER — PROPOFOL 500 MG/50ML IV EMUL
INTRAVENOUS | Status: DC | PRN
  Administered 2020-04-03: 20 mg via INTRAVENOUS
  Administered 2020-04-03: 10 mg via INTRAVENOUS
  Administered 2020-04-03 (×3): 20 mg via INTRAVENOUS
  Administered 2020-04-03: 10 mg via INTRAVENOUS
  Administered 2020-04-03: 50 mg via INTRAVENOUS
  Administered 2020-04-03: 20 mg via INTRAVENOUS
  Administered 2020-04-03: 30 mg via INTRAVENOUS

## 2020-04-03 MED ORDER — TIZANIDINE HCL 4 MG PO TABS: 4.0000 mg | ORAL_TABLET | Freq: Four times a day (QID) | ORAL | 1 refills | Status: AC | PRN

## 2020-04-03 MED ORDER — FENTANYL CITRATE (PF) 100 MCG/2ML IJ SOLN: 25.0000 ug | INTRAMUSCULAR | Status: DC | PRN

## 2020-04-03 MED ORDER — KETOROLAC TROMETHAMINE 15 MG/ML IJ SOLN
INTRAMUSCULAR | Status: AC
  Filled 2020-04-03: qty 1

## 2020-04-03 MED ORDER — ONDANSETRON HCL 4 MG/2ML IJ SOLN
INTRAMUSCULAR | Status: DC | PRN
  Administered 2020-04-03: 4 mg via INTRAVENOUS

## 2020-04-03 MED ORDER — ACETAMINOPHEN 500 MG PO TABS
500.0000 mg | ORAL_TABLET | Freq: Four times a day (QID) | ORAL | Status: DC
Start: 2020-04-03 — End: 2020-04-03

## 2020-04-03 MED ORDER — METHOCARBAMOL 500 MG IVPB - SIMPLE MED: 500.0000 mg | Freq: Four times a day (QID) | INTRAVENOUS | Status: DC | PRN

## 2020-04-03 MED ORDER — ACETAMINOPHEN 325 MG PO TABS: 325.0000 mg | ORAL_TABLET | Freq: Four times a day (QID) | ORAL | Status: DC | PRN

## 2020-04-03 MED ORDER — BUPIVACAINE-EPINEPHRINE (PF) 0.25% -1:200000 IJ SOLN
INTRAMUSCULAR | Status: DC | PRN
  Administered 2020-04-03: 30 mL

## 2020-04-03 MED ORDER — LACTATED RINGERS IV SOLN: INTRAVENOUS | Status: DC

## 2020-04-03 MED ORDER — KETOROLAC TROMETHAMINE 15 MG/ML IJ SOLN
7.5000 mg | Freq: Four times a day (QID) | INTRAMUSCULAR | Status: DC
  Administered 2020-04-03: 7.5 mg via INTRAVENOUS

## 2020-04-03 MED ORDER — 0.9 % SODIUM CHLORIDE (POUR BTL) OPTIME
TOPICAL | Status: DC | PRN
  Administered 2020-04-03: 1000 mL

## 2020-04-03 MED ORDER — POVIDONE-IODINE 10 % EX SWAB
2.0000 "application " | Freq: Once | CUTANEOUS | Status: AC
  Administered 2020-04-03: 2 via TOPICAL

## 2020-04-03 MED ORDER — LIDOCAINE HCL (CARDIAC) PF 100 MG/5ML IV SOSY
PREFILLED_SYRINGE | INTRAVENOUS | Status: DC | PRN
  Administered 2020-04-03: 40 mg via INTRAVENOUS

## 2020-04-03 MED ORDER — METOCLOPRAMIDE HCL 5 MG PO TABS
5.0000 mg | ORAL_TABLET | Freq: Three times a day (TID) | ORAL | Status: DC | PRN
  Filled 2020-04-03: qty 2

## 2020-04-03 MED ORDER — TRANEXAMIC ACID-NACL 1000-0.7 MG/100ML-% IV SOLN
1000.0000 mg | INTRAVENOUS | Status: AC
  Administered 2020-04-03: 1000 mg via INTRAVENOUS
  Filled 2020-04-03: qty 100

## 2020-04-03 MED ORDER — MORPHINE SULFATE (PF) 4 MG/ML IV SOLN: 0.5000 mg | INTRAVENOUS | Status: DC | PRN

## 2020-04-03 MED ORDER — PROPOFOL 10 MG/ML IV BOLUS
INTRAVENOUS | Status: AC
  Filled 2020-04-03: qty 20

## 2020-04-03 MED ORDER — ONDANSETRON HCL 4 MG/2ML IJ SOLN
INTRAMUSCULAR | Status: AC
  Filled 2020-04-03: qty 2

## 2020-04-03 MED ORDER — CEFAZOLIN SODIUM-DEXTROSE 2-4 GM/100ML-% IV SOLN
INTRAVENOUS | Status: AC
  Filled 2020-04-03: qty 100

## 2020-04-03 MED ORDER — ONDANSETRON HCL 4 MG PO TABS
4.0000 mg | ORAL_TABLET | Freq: Four times a day (QID) | ORAL | Status: DC | PRN
  Filled 2020-04-03: qty 1

## 2020-04-03 MED ORDER — ONDANSETRON HCL 4 MG/2ML IJ SOLN: 4.0000 mg | Freq: Four times a day (QID) | INTRAMUSCULAR | Status: DC | PRN

## 2020-04-03 MED ORDER — ASPIRIN EC 81 MG PO TBEC: 81.0000 mg | DELAYED_RELEASE_TABLET | Freq: Two times a day (BID) | ORAL | 0 refills | Status: DC

## 2020-04-03 MED ORDER — TRANEXAMIC ACID-NACL 1000-0.7 MG/100ML-% IV SOLN
INTRAVENOUS | Status: AC
  Filled 2020-04-03: qty 100

## 2020-04-03 MED ORDER — HYDROCODONE-ACETAMINOPHEN 5-325 MG PO TABS: 1.0000 | ORAL_TABLET | Freq: Four times a day (QID) | ORAL | 0 refills | Status: AC | PRN

## 2020-04-03 MED ORDER — METHOCARBAMOL 500 MG PO TABS: 500.0000 mg | ORAL_TABLET | Freq: Four times a day (QID) | ORAL | Status: DC | PRN

## 2020-04-03 MED ORDER — PHENYLEPHRINE 40 MCG/ML (10ML) SYRINGE FOR IV PUSH (FOR BLOOD PRESSURE SUPPORT)
PREFILLED_SYRINGE | INTRAVENOUS | Status: AC
  Filled 2020-04-03: qty 10

## 2020-04-03 MED ORDER — DEXAMETHASONE SODIUM PHOSPHATE 10 MG/ML IJ SOLN
INTRAMUSCULAR | Status: AC
  Filled 2020-04-03: qty 1

## 2020-04-03 MED ORDER — BUPIVACAINE-EPINEPHRINE (PF) 0.25% -1:200000 IJ SOLN
INTRAMUSCULAR | Status: AC
  Filled 2020-04-03: qty 30

## 2020-04-03 MED ORDER — BUPIVACAINE LIPOSOME 1.3 % IJ SUSP
INTRAMUSCULAR | Status: DC | PRN
  Administered 2020-04-03: 10 mL

## 2020-04-03 MED ORDER — ONDANSETRON HCL 4 MG/2ML IJ SOLN: 4.0000 mg | Freq: Once | INTRAMUSCULAR | Status: DC | PRN

## 2020-04-03 SURGICAL SUPPLY — 43 items
BAG DECANTER FOR FLEXI CONT (MISCELLANEOUS) ×2 IMPLANT
BLADE SAW SGTL 18X1.27X75 (BLADE) ×2 IMPLANT
BOOTIES KNEE HIGH SLOAN (MISCELLANEOUS) ×2 IMPLANT
CELLS DAT CNTRL 66122 CELL SVR (MISCELLANEOUS) ×1 IMPLANT
COVER PERINEAL POST (MISCELLANEOUS) ×2 IMPLANT
COVER SURGICAL LIGHT HANDLE (MISCELLANEOUS) ×2 IMPLANT
COVER WAND RF STERILE (DRAPES) IMPLANT
CUP GRIPTON 48MM 100 HIP (Hips) ×2 IMPLANT
DECANTER SPIKE VIAL GLASS SM (MISCELLANEOUS) ×2 IMPLANT
DRAPE IMP U-DRAPE 54X76 (DRAPES) ×2 IMPLANT
DRAPE ORTHO SPLIT 77X108 STRL (DRAPES)
DRAPE STERI IOBAN 125X83 (DRAPES) ×2 IMPLANT
DRAPE SURG ORHT 6 SPLT 77X108 (DRAPES) IMPLANT
DRAPE U-SHAPE 47X51 STRL (DRAPES) ×4 IMPLANT
DRSG AQUACEL AG ADV 3.5X 6 (GAUZE/BANDAGES/DRESSINGS) ×2 IMPLANT
DURAPREP 26ML APPLICATOR (WOUND CARE) ×2 IMPLANT
ELECT BLADE TIP CTD 4 INCH (ELECTRODE) ×2 IMPLANT
ELECT REM PT RETURN 15FT ADLT (MISCELLANEOUS) ×2 IMPLANT
ELIMINATOR HOLE APEX DEPUY (Hips) ×2 IMPLANT
GLOVE BIO SURGEON STRL SZ8 (GLOVE) ×4 IMPLANT
GLOVE SRG 8 PF TXTR STRL LF DI (GLOVE) ×2 IMPLANT
GLOVE SURG UNDER POLY LF SZ8 (GLOVE) ×2
GOWN STRL REUS W/TWL XL LVL3 (GOWN DISPOSABLE) ×4 IMPLANT
HEAD FEMORAL 32 CERAMIC (Hips) ×2 IMPLANT
HOLDER FOLEY CATH W/STRAP (MISCELLANEOUS) ×2 IMPLANT
KIT TURNOVER KIT A (KITS) IMPLANT
MANIFOLD NEPTUNE II (INSTRUMENTS) ×2 IMPLANT
NEEDLE HYPO 22GX1.5 SAFETY (NEEDLE) ×2 IMPLANT
NS IRRIG 1000ML POUR BTL (IV SOLUTION) ×2 IMPLANT
PACK ANTERIOR HIP CUSTOM (KITS) ×2 IMPLANT
PENCIL SMOKE EVACUATOR (MISCELLANEOUS) IMPLANT
PINN ALTRX NEUT ID X OD 32X48 ×2 IMPLANT
PROTECTOR NERVE ULNAR (MISCELLANEOUS) ×2 IMPLANT
RTRCTR WOUND ALEXIS 18CM MED (MISCELLANEOUS) ×2
STEM FEMORAL SZ 5MM STD ACTIS (Stem) ×2 IMPLANT
SUT ETHIBOND NAB CT1 #1 30IN (SUTURE) ×4 IMPLANT
SUT VIC AB 1 CT1 36 (SUTURE) ×2 IMPLANT
SUT VIC AB 2-0 CT1 27 (SUTURE) ×1
SUT VIC AB 2-0 CT1 TAPERPNT 27 (SUTURE) ×1 IMPLANT
SUT VICRYL AB 3-0 FS1 BRD 27IN (SUTURE) ×2 IMPLANT
SUT VLOC 180 0 24IN GS25 (SUTURE) ×2 IMPLANT
SYR 50ML LL SCALE MARK (SYRINGE) ×2 IMPLANT
TRAY FOLEY MTR SLVR 16FR STAT (SET/KITS/TRAYS/PACK) ×2 IMPLANT

## 2020-04-03 NOTE — Transfer of Care (Signed)
Immediate Anesthesia Transfer of Care Note  Patient: [facility]  Procedure(s) Performed: RIGHT TOTAL HIP ARTHROPLASTY ANTERIOR APPROACH (Right Hip)  Patient Location: PACU  Anesthesia Type:Spinal  Level of Consciousness: awake, alert , oriented and patient cooperative  Airway & Oxygen Therapy: Patient Spontanous Breathing  Post-op Assessment: Report given to RN, Post -op Vital signs reviewed and stable and Patient moving all extremities X 4  Post vital signs: Reviewed and stable  Last Vitals:  Vitals Value Taken Time  BP 102/63 04/03/20 1148  Temp    Pulse 67 04/03/20 1150  Resp 18 04/03/20 1150  SpO2 97 % 04/03/20 1150  Vitals shown include unvalidated device data.  Last Pain:  Vitals:   04/03/20 0816  TempSrc:   PainSc: 0-No pain         Complications: No complications documented.

## 2020-04-03 NOTE — Interval H&P Note (Signed)
History and Physical Interval Note:  04/03/2020 9:13 AM  Karen Baker  has presented today for surgery, with the diagnosis of RIGHT HIP DEGENERATIVE JOINT DISESAE.  The various methods of treatment have been discussed with the patient and family. After consideration of risks, benefits and other options for treatment, the patient has consented to  Procedure(s): RIGHT TOTAL HIP ARTHROPLASTY ANTERIOR APPROACH (Right) as a surgical intervention.  The patient's history has been reviewed, patient examined, no change in status, stable for surgery.  I have reviewed the patient's chart and labs.  Questions were answered to the patient's satisfaction.     [name]

## 2020-04-03 NOTE — Op Note (Signed)

## 2020-04-03 NOTE — Anesthesia Postprocedure Evaluation (Signed)
Anesthesia Post Note  Patient: Karen Baker  Procedure(s) Performed: RIGHT TOTAL HIP ARTHROPLASTY ANTERIOR APPROACH (Right Hip)     Patient location during evaluation: PACU Anesthesia Type: Spinal Level of consciousness: awake Pain management: pain level controlled Vital Signs Assessment: post-procedure vital signs reviewed and stable Respiratory status: spontaneous breathing, respiratory function stable and patient connected to nasal cannula oxygen Cardiovascular status: blood pressure returned to baseline and stable Postop Assessment: no headache, no backache and no apparent nausea or vomiting Anesthetic complications: no   No complications documented.  Last Vitals:  Vitals:   04/03/20 1505 04/03/20 1600  BP: (!) 108/59 126/74  Pulse: 78 79  Resp: 14 16  Temp:  (!) 36.4 C  SpO2: 95% 96%    Last Pain:  Vitals:   04/03/20 1600  TempSrc:   PainSc: 2                  Machaela Caterino P Valori Hollenkamp

## 2020-04-03 NOTE — Anesthesia Procedure Notes (Signed)
Procedure Name: MAC Date/Time: 04/03/2020 10:10 AM Performed by: Michele Rockers, CRNA Pre-anesthesia Checklist: Patient identified, Emergency Drugs available, Suction available, Timeout performed and Patient being monitored Patient Re-evaluated:Patient Re-evaluated prior to induction Oxygen Delivery Method: Simple face mask

## 2020-04-03 NOTE — Evaluation (Signed)
Physical Therapy Evaluation-1x Patient Details Name: Karen Baker MRN: [id number] DOB: 03/21/1951 Today's Date: 04/03/2020   History of Present Illness  70 yo female s/p R THA-direct anterior 04/04/19. Hx of L THA-DA 2016  Clinical Impression  On eval POD 0, pt was Min guard-Min assist for mobility. She walked ~75 feet with a RW. Minimal pain with activity. Pt denied lightheadedness/dizziness. Pt tolerated session well. She met her PT goals and she is eager to d/c home on today. 1x eval. All education completed.     Follow Up Recommendations Follow surgeon's recommendation for DC plan and follow-up therapies;Supervision/Assistance - 24 hour    Equipment Recommendations  Rolling walker with 5" wheels    Recommendations for Other Services       Precautions / Restrictions Precautions Precautions: Fall Restrictions Weight Bearing Restrictions: No Other Position/Activity Restrictions: WBAT      Mobility  Bed Mobility Overal bed mobility: Needs Assistance Bed Mobility: Supine to Sit     Supine to sit: Min assist;HOB elevated     General bed mobility comments: Small amount of assist for R LE. Increased time. Cues for safety, technique.    Transfers Overall transfer level: Needs assistance Equipment used: Rolling walker (2 wheeled) Transfers: Sit to/from Stand Sit to Stand: Min guard         General transfer comment: Min guard for safety. VCs safety, technique, hand placement.  Ambulation/Gait Ambulation/Gait assistance: Min guard Gait Distance (Feet): 75 Feet Assistive device: Rolling walker (2 wheeled) Gait Pattern/deviations: Step-to pattern     General Gait Details: Min guard for safety. Slow gait speed. No LOB with RW use. Pt denied lightheadedness/dizziness.  Stairs            Wheelchair Mobility    Modified Rankin (Stroke Patients Only)       Balance Overall balance assessment: Needs assistance         Standing balance support: Bilateral  upper extremity supported Standing balance-Leahy Scale: Fair                               Pertinent Vitals/Pain Pain Assessment: 0-10 Pain Score: 4  Pain Location: R hip/thigh Pain Descriptors / Indicators: Aching;Discomfort;Sore Pain Intervention(s): Monitored during session;Repositioned;Ice applied    Home Living Family/patient expects to be discharged to:: Private residence Living Arrangements: Spouse/significant other (pt takes care of her husband) Available Help at Discharge: Family;Available 24 hours/day Type of Home: House Home Access: Level entry     Home Layout: One level;Laundry or work area in [employer]: None      Prior Function Level of Independence: Independent               Journalist, newspaper        Extremity/Trunk Assessment   Upper Extremity Assessment Upper Extremity Assessment: Overall WFL for tasks assessed    Lower Extremity Assessment Lower Extremity Assessment: Generalized weakness    Cervical / Trunk Assessment Cervical / Trunk Assessment: Normal  Communication   Communication: No difficulties  Cognition Arousal/Alertness: Awake/alert Behavior During Therapy: WFL for tasks assessed/performed Overall Cognitive Status: Within Functional Limits for tasks assessed                                        General Comments      Exercises Total Joint Exercises Ankle Circles/Pumps: AROM;Both;10 reps Quad Sets:  AROM;Both;10 reps Heel Slides: AAROM;Right;10 reps Hip ABduction/ADduction: AAROM;Right;10 reps   Assessment/Plan    PT Assessment All further PT needs can be met in the next venue of care  PT Problem List Decreased strength;Decreased mobility;Decreased range of motion;Decreased activity tolerance;Decreased balance;Pain;Decreased knowledge of use of DME       PT Treatment Interventions      PT Goals (Current goals can be found in the Care Plan section)  Acute Rehab PT Goals PT Goal  Formulation: All assessment and education complete, DC therapy    Frequency     Barriers to discharge        Co-evaluation               AM-PAC PT "6 Clicks" Mobility  Outcome Measure Help needed turning from your back to your side while in a flat bed without using bedrails?: A Little Help needed moving from lying on your back to sitting on the side of a flat bed without using bedrails?: A Little Help needed moving to and from a bed to a chair (including a wheelchair)?: A Little Help needed standing up from a chair using your arms (e.g., wheelchair or bedside chair)?: A Little Help needed to walk in hospital room?: A Little Help needed climbing 3-5 steps with a railing? : A Little 6 Click Score: 18    End of Session Equipment Utilized During Treatment: Gait belt Activity Tolerance: Patient tolerated treatment well Patient left: in chair;with call bell/phone within reach   PT Visit Diagnosis: Other abnormalities of gait and mobility (R26.89)    Time: 2446-2863 PT Time Calculation (min) (ACUTE ONLY): 25 min   Charges:   PT Evaluation $PT Eval Low Complexity: 1 Low PT Treatments $Gait Training: 8-22 mins           Doreatha Massed, PT Acute Rehabilitation  Office: [phone] Pager: [phone]

## 2020-04-03 NOTE — Anesthesia Procedure Notes (Signed)
Spinal  Patient location during procedure: OR Start time: 04/03/2020 10:10 AM End time: 04/03/2020 10:15 AM Staffing Performed: anesthesiologist  Anesthesiologist: Leonides Grills, MD Preanesthetic Checklist Completed: patient identified, IV checked, risks and benefits discussed, surgical consent, monitors and equipment checked, pre-op evaluation and timeout performed Spinal Block Patient position: sitting Prep: DuraPrep Patient monitoring: cardiac monitor, continuous pulse ox and blood pressure Approach: left paramedian Location: L4-5 Injection technique: single-shot Needle Needle type: Pencan  Needle gauge: 24 G Needle length: 9 cm Assessment Sensory level: T10 Additional Notes Functioning IV was confirmed and monitors were applied. Sterile prep and drape, including hand hygiene and sterile gloves were used. The patient was positioned and the spine was prepped. The skin was anesthetized with lidocaine.  Free flow of clear CSF was obtained on the third attempt prior to injecting local anesthetic into the CSF.  The spinal needle aspirated freely following injection.  The needle was carefully withdrawn.  The patient tolerated the procedure well.

## 2020-04-04 ENCOUNTER — Encounter (HOSPITAL_COMMUNITY): Payer: Self-pay | Admitting: Orthopaedic Surgery

## 2020-04-05 DIAGNOSIS — Z96641 Presence of right artificial hip joint: Secondary | ICD-10-CM | POA: Diagnosis not present

## 2020-04-05 DIAGNOSIS — M25551 Pain in right hip: Secondary | ICD-10-CM | POA: Diagnosis not present

## 2020-04-06 ENCOUNTER — Other Ambulatory Visit: Payer: Self-pay | Admitting: Cardiology

## 2020-04-10 DIAGNOSIS — M25551 Pain in right hip: Secondary | ICD-10-CM | POA: Diagnosis not present

## 2020-04-10 DIAGNOSIS — Z96641 Presence of right artificial hip joint: Secondary | ICD-10-CM | POA: Diagnosis not present

## 2020-04-11 DIAGNOSIS — G4733 Obstructive sleep apnea (adult) (pediatric): Secondary | ICD-10-CM | POA: Diagnosis not present

## 2020-04-12 DIAGNOSIS — M25551 Pain in right hip: Secondary | ICD-10-CM | POA: Diagnosis not present

## 2020-04-12 DIAGNOSIS — Z96641 Presence of right artificial hip joint: Secondary | ICD-10-CM | POA: Diagnosis not present

## 2020-04-13 DIAGNOSIS — Z96643 Presence of artificial hip joint, bilateral: Secondary | ICD-10-CM | POA: Diagnosis not present

## 2020-04-17 ENCOUNTER — Ambulatory Visit: Payer: Medicare PPO | Admitting: Cardiology

## 2020-04-17 DIAGNOSIS — Z961 Presence of intraocular lens: Secondary | ICD-10-CM | POA: Diagnosis not present

## 2020-04-27 DIAGNOSIS — M810 Age-related osteoporosis without current pathological fracture: Secondary | ICD-10-CM | POA: Diagnosis not present

## 2020-04-27 DIAGNOSIS — M8589 Other specified disorders of bone density and structure, multiple sites: Secondary | ICD-10-CM | POA: Diagnosis not present

## 2020-04-30 ENCOUNTER — Other Ambulatory Visit: Payer: Self-pay

## 2020-04-30 DIAGNOSIS — R0600 Dyspnea, unspecified: Secondary | ICD-10-CM | POA: Insufficient documentation

## 2020-04-30 DIAGNOSIS — R350 Frequency of micturition: Secondary | ICD-10-CM | POA: Insufficient documentation

## 2020-04-30 DIAGNOSIS — Z8709 Personal history of other diseases of the respiratory system: Secondary | ICD-10-CM | POA: Insufficient documentation

## 2020-04-30 DIAGNOSIS — K5900 Constipation, unspecified: Secondary | ICD-10-CM | POA: Insufficient documentation

## 2020-04-30 DIAGNOSIS — F419 Anxiety disorder, unspecified: Secondary | ICD-10-CM | POA: Insufficient documentation

## 2020-04-30 DIAGNOSIS — K219 Gastro-esophageal reflux disease without esophagitis: Secondary | ICD-10-CM | POA: Insufficient documentation

## 2020-04-30 DIAGNOSIS — M2550 Pain in unspecified joint: Secondary | ICD-10-CM | POA: Insufficient documentation

## 2020-04-30 DIAGNOSIS — M2540 Effusion, unspecified joint: Secondary | ICD-10-CM | POA: Insufficient documentation

## 2020-04-30 DIAGNOSIS — Z9289 Personal history of other medical treatment: Secondary | ICD-10-CM | POA: Insufficient documentation

## 2020-04-30 DIAGNOSIS — R3915 Urgency of urination: Secondary | ICD-10-CM | POA: Insufficient documentation

## 2020-04-30 DIAGNOSIS — E785 Hyperlipidemia, unspecified: Secondary | ICD-10-CM | POA: Insufficient documentation

## 2020-04-30 DIAGNOSIS — R7303 Prediabetes: Secondary | ICD-10-CM | POA: Insufficient documentation

## 2020-04-30 DIAGNOSIS — I10 Essential (primary) hypertension: Secondary | ICD-10-CM | POA: Insufficient documentation

## 2020-04-30 DIAGNOSIS — J189 Pneumonia, unspecified organism: Secondary | ICD-10-CM | POA: Insufficient documentation

## 2020-04-30 DIAGNOSIS — M1990 Unspecified osteoarthritis, unspecified site: Secondary | ICD-10-CM | POA: Insufficient documentation

## 2020-04-30 DIAGNOSIS — G4730 Sleep apnea, unspecified: Secondary | ICD-10-CM | POA: Insufficient documentation

## 2020-05-01 ENCOUNTER — Ambulatory Visit: Payer: Medicare PPO | Admitting: Cardiology

## 2020-05-01 ENCOUNTER — Encounter: Payer: Self-pay | Admitting: Cardiology

## 2020-05-01 ENCOUNTER — Other Ambulatory Visit: Payer: Self-pay

## 2020-05-01 VITALS — BP 130/66 | HR 78 | Ht 63.0 in | Wt 189.0 lb

## 2020-05-01 DIAGNOSIS — I25119 Atherosclerotic heart disease of native coronary artery with unspecified angina pectoris: Secondary | ICD-10-CM | POA: Diagnosis not present

## 2020-05-01 DIAGNOSIS — I10 Essential (primary) hypertension: Secondary | ICD-10-CM | POA: Diagnosis not present

## 2020-05-01 DIAGNOSIS — G4733 Obstructive sleep apnea (adult) (pediatric): Secondary | ICD-10-CM | POA: Diagnosis not present

## 2020-05-01 DIAGNOSIS — E669 Obesity, unspecified: Secondary | ICD-10-CM

## 2020-05-01 NOTE — Progress Notes (Signed)
Cardiology Office Note:    Date:  05/01/2020   ID:  Karen Baker, DOB May 09, 1950, MRN [id number]  PCP:  Earlyne Iba, NP  Cardiologist:  Berniece Salines, DO  Electrophysiologist:  None   Referring MD: Earlyne Iba, NP   " I am doing well"  History of Present Illness:    Karen Baker is a 70 y.o. female with a hx of coronary artery disease, severe obstructive sleep apnea now on BiPAP, hyperlipidemia, hypertension is here today for follow-up visit.  Did see the patient back in July 2021 at that time she had just been recently diagnosed with her sleep apnea.  In addition she was doing well she had no angina symptoms and told me she was eventually planning for surgery.    In the interim the patient has had her surgery and is doing well.  It was a right hip surgery.  She offers no complaints at this time.  Past Medical History:  Diagnosis Date  . Anxiety    takes Xanax daily  . Arthritis   . Constipation    takes Colace daily  . Dyspnea    with exertion   . GERD (gastroesophageal reflux disease)    takes Omeprazole daily  . History of blood transfusion    no abnormal reaction noted  . History of bronchitis 3-66yrs ago  . Hyperlipidemia    takes Lovastatin daily  . Hypertension    takes Amlodipine daily  . Joint pain   . Joint swelling   . 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 Pneumonia    hx of covid 03/2019 and then developed peumonia afterwards   . Pre-diabetes   . Primary osteoarthritis of left hip 05/30/2014  . Sleep apnea    bipap   . Urinary frequency   . Urinary urgency     Past Surgical History:  Procedure Laterality Date  . ABDOMINAL HYSTERECTOMY  1972  . APPENDECTOMY  1969  . COLONOSCOPY    . cyst removed from ovary    . knee athroscopy Right   . knots removed from neck    . LEFT HEART CATH AND CORONARY ANGIOGRAPHY N/A 06/27/2019   Procedure: LEFT HEART CATH AND CORONARY ANGIOGRAPHY;  Surgeon: Martinique, Peter M, MD;  Location: [facility];  Service:  Cardiovascular;  Laterality: N/A;  . TONSILLECTOMY  1956  . TOTAL HIP ARTHROPLASTY Left 05/30/2014   Procedure: LEFT TOTAL HIP ARTHROPLASTY ANTERIOR APPROACH;  Surgeon: Hessie Dibble, MD;  Location: [facility];  Service: Orthopedics;  Laterality: Left;  . TOTAL HIP ARTHROPLASTY Right 04/03/2020   Procedure: RIGHT TOTAL HIP ARTHROPLASTY ANTERIOR APPROACH;  Surgeon: Melrose Nakayama, MD;  Location: [facility];  Service: Orthopedics;  Laterality: Right;  . tumors removed from arches in feet Bilateral     Current Medications: Current Meds  Medication Sig  . amLODipine (NORVASC) 5 MG tablet Take 5 mg by mouth daily.  Marland Kitchen aspirin EC 81 MG tablet Take 1 tablet (81 mg total) by mouth 2 (two) times daily after a meal.  . cholecalciferol (VITAMIN D3) 25 MCG (1000 UNIT) tablet Take 1,000 Units by mouth daily.  Marland Kitchen HYDROcodone-acetaminophen (NORCO/VICODIN) 5-325 MG tablet Take 1-2 tablets by mouth every 6 (six) hours as needed for moderate pain or severe pain (POST OP PAIN).  Marland Kitchen isosorbide mononitrate (IMDUR) 30 MG 24 hr tablet TAKE 1 TABLET BY MOUTH EVERY DAY  . lisinopril (ZESTRIL) 5 MG tablet Take 5 mg by mouth daily.  . metFORMIN (GLUCOPHAGE-XR) 500 MG  24 hr tablet Take 500 mg by mouth daily with breakfast.  . nitroGLYCERIN (NITROSTAT) 0.4 MG SL tablet Place 0.4 mg under the tongue every 5 (five) minutes as needed for chest pain.  . pantoprazole (PROTONIX) 40 MG tablet Take 40 mg by mouth daily.  . rosuvastatin (CRESTOR) 20 MG tablet Take 20 mg by mouth daily.  Marland Kitchen tiZANidine (ZANAFLEX) 4 MG tablet Take 1 tablet (4 mg total) by mouth every 6 (six) hours as needed for muscle spasms.     Allergies:   Azithromycin   Social History   Socioeconomic History  . Marital status: Married    Spouse name: Not on file  . Number of children: Not on file  . Years of education: Not on file  . Highest education level: Not on file  Occupational History  . Not on file  Tobacco Use  . Smoking status: Former Smoker     Packs/day: 1.00    Years: 20.00    Pack years: 20.00    Types: Cigarettes    Quit date: 02/17/2004    Years since quitting: 16.2  . Smokeless tobacco: Never Used  . Tobacco comment: no cigs in over a week  Vaping Use  . Vaping Use: Never used  Substance and Sexual Activity  . Alcohol use: No  . Drug use: No  . Sexual activity: Yes    Birth control/protection: Surgical  Other Topics Concern  . Not on file  Social History Narrative  . Not on file   Social Determinants of Health   Financial Resource Strain: Not on file  Food Insecurity: Not on file  Transportation Needs: Not on file  Physical Activity: Not on file  Stress: Not on file  Social Connections: Not on file     Family History: The patient's family history includes Cancer in her sister; Hypertension in her brother and mother; Stroke in her mother.  ROS:   Review of Systems  Constitution: Negative for decreased appetite, fever and weight gain.  HENT: Negative for congestion, ear discharge, hoarse voice and sore throat.   Eyes: Negative for discharge, redness, vision loss in right eye and visual halos.  Cardiovascular: Negative for chest pain, dyspnea on exertion, leg swelling, orthopnea and palpitations.  Respiratory: Negative for cough, hemoptysis, shortness of breath and snoring.   Endocrine: Negative for heat intolerance and polyphagia.  Hematologic/Lymphatic: Negative for bleeding problem. Does not bruise/bleed easily.  Skin: Negative for flushing, nail changes, rash and suspicious lesions.  Musculoskeletal: Negative for arthritis, joint pain, muscle cramps, myalgias, neck pain and stiffness.  Gastrointestinal: Negative for abdominal pain, bowel incontinence, diarrhea and excessive appetite.  Genitourinary: Negative for decreased libido, genital sores and incomplete emptying.  Neurological: Negative for brief paralysis, focal weakness, headaches and loss of balance.  Psychiatric/Behavioral: Negative for altered  mental status, depression and suicidal ideas.  Allergic/Immunologic: Negative for HIV exposure and persistent infections.    EKGs/Labs/Other Studies Reviewed:    The following studies were reviewed today:   EKG: None today   Left heart catheterization  Prox LAD to Mid LAD lesion is 35% stenosed.  2nd Diag lesion is 70% stenosed.  LPDA lesion is 35% stenosed.  Ost RCA lesion is 25% stenosed.  The left ventricular systolic function is normal.  LV end diastolic pressure is normal.  The left ventricular ejection fraction is 55-65% by visual estimate.  1. Nonobstructive CAD 2. Normal LV function 3. Elevated LVEDP 24 mm Hg. Plan: medical management. Patient may be a candidate for  DC later today.  Transthoracic echocardiogram IMPRESSIONS  1. Left ventricular ejection fraction, by estimation, is 60 to 65%. The left ventricle has normal function. The left ventricle has no regional wall motion abnormalities. Left ventricular diastolic parameters are indeterminate.  2. Right ventricular systolic function is normal. The right ventricular  size is normal.  3. The mitral valve is normal in structure. Trivial mitral valve  regurgitation. No evidence of mitral stenosis.  4. The aortic valve is normal in structure. Aortic valve regurgitation is  not visualized. No aortic stenosis is present.   FINDINGS  Left Ventricle: Left ventricular ejection fraction, by estimation, is 60  to 65%. The left ventricle has normal function. The left ventricle has no  regional wall motion abnormalities. The left ventricular internal cavity  size was normal in size. There is  no left ventricular hypertrophy. Left ventricular diastolic parameters  are indeterminate.   Right Ventricle: The right ventricular size is normal. No increase in  right ventricular wall thickness. Right ventricular systolic function is  normal.   Left Atrium: Left atrial size was normal in size.   Right Atrium: Right  atrial size was normal in size.   Pericardium: There is no evidence of pericardial effusion.   Mitral Valve: The mitral valve is normal in structure. Trivial mitral  valve regurgitation. No evidence of mitral valve stenosis.   Tricuspid Valve: The tricuspid valve is grossly normal. Tricuspid valve  regurgitation is trivial.   Aortic Valve: The aortic valve is normal in structure. Aortic valve  regurgitation is not visualized. No aortic stenosis is present.   Pulmonic Valve: The pulmonic valve was normal in structure. Pulmonic valve  regurgitation is not visualized. No evidence of pulmonic stenosis.   Aorta: The aortic root and ascending aorta are structurally normal, with  no evidence of dilitation.   IAS/Shunts: The atrial septum is grossly normal.    Recent Labs: 03/22/2020: BUN 21; Creatinine, Ser 0.93; Hemoglobin 11.8; Platelets 234; Potassium 4.8; Sodium 140  Recent Lipid Panel    Component Value Date/Time   CHOL 96 06/25/2019 0037   TRIG 112 06/25/2019 0037   HDL 32 (L) 06/25/2019 0037   CHOLHDL 3.0 06/25/2019 0037   VLDL 22 06/25/2019 0037   LDLCALC 42 06/25/2019 0037    Physical Exam:    VS:  BP 130/66   Pulse 78   Ht 5\' 3"  (1.6 m)   Wt 189 lb (85.7 kg)   SpO2 94%   BMI 33.48 kg/m     Wt Readings from Last 3 Encounters:  05/01/20 189 lb (85.7 kg)  04/03/20 168 lb 12.8 oz (76.6 kg)  11/28/19 175 lb (79.4 kg)     GEN: Well nourished, well developed in no acute distress HEENT: Normal NECK: No JVD; No carotid bruits LYMPHATICS: No lymphadenopathy CARDIAC: S1S2 noted,RRR, no murmurs, rubs, gallops RESPIRATORY:  Clear to auscultation without rales, wheezing or rhonchi  ABDOMEN: Soft, non-tender, non-distended, +bowel sounds, no guarding. EXTREMITIES: No edema, No cyanosis, no clubbing MUSCULOSKELETAL:  No deformity  SKIN: Warm and dry NEUROLOGIC:  Alert and oriented x 3, non-focal PSYCHIATRIC:  Normal affect, good insight  ASSESSMENT:    1. Coronary  artery disease involving native coronary artery of native heart with angina pectoris ([place])   2. Essential hypertension   3. Obstructive sleep apnea syndrome   4. Obesity (BMI 30-39.9)    PLAN:     She is doing well from a cardiovascular standpoint. I will continue her current medication management.  Her blood pressure acceptable no changes will be made here.  She continues on her BiPAP and she is happy with it.  She is recovering well from her surgery.  Hopefully once she is recovered she can be able to get back into her exercise routine.  The patient is in agreement with the above plan. The patient left the office in stable condition.  The patient will follow up in 1 year   Medication Adjustments/Labs and Tests Ordered: Current medicines are reviewed at length with the patient today.  Concerns regarding medicines are outlined above.  No orders of the defined types were placed in this encounter.  No orders of the defined types were placed in this encounter.   Patient Instructions  Medication Instructions:  Your physician recommends that you continue on your current medications as directed. Please refer to the Current Medication list given to you today.  *If you need a refill on your cardiac medications before your next appointment, please call your pharmacy*   Lab Work: None If you have labs (blood work) drawn today and your tests are completely normal, you will receive your results only by: Marland Kitchen MyChart Message (if you have MyChart) OR . A paper copy in the mail If you have any lab test that is abnormal or we need to change your treatment, we will call you to review the results.   Testing/Procedures: None   Follow-Up: At [facility], you and your health needs are our priority.  As part of our continuing mission to provide you with exceptional heart care, we have created designated Provider Care Teams.  These Care Teams include your primary Cardiologist (physician) and  Advanced Practice Providers (APPs -  Physician Assistants and Nurse Practitioners) who all work together to provide you with the care you need, when you need it.  We recommend signing up for the patient portal called "MyChart".  Sign up information is provided on this After Visit Summary.  MyChart is used to connect with patients for Virtual Visits (Telemedicine).  Patients are able to view lab/test results, encounter notes, upcoming appointments, etc.  Non-urgent messages can be sent to your provider as well.   To learn more about what you can do with MyChart, go to [email or web address].    Your next appointment:   1 year(s)  The format for your next appointment:   In Person  Provider:   Berniece Salines, DO   Other Instructions      Adopting a Healthy Lifestyle.  Know what a healthy weight is for you (roughly BMI <25) and aim to maintain this   Aim for 7+ servings of fruits and vegetables daily   65-80+ fluid ounces of water or unsweet tea for healthy kidneys   Limit to max 1 drink of alcohol per day; avoid smoking/tobacco   Limit animal fats in diet for cholesterol and heart health - choose grass fed whenever available   Avoid highly processed foods, and foods high in saturated/trans fats   Aim for low stress - take time to unwind and care for your mental health   Aim for 150 min of moderate intensity exercise weekly for heart health, and weights twice weekly for bone health   Aim for 7-9 hours of sleep daily   When it comes to diets, agreement about the perfect plan isnt easy to find, even among the experts. Experts at the [employer] developed an idea known as the Healthy Eating Plate. Just imagine a plate divided  into logical, healthy portions.   The emphasis is on diet quality:   Load up on vegetables and fruits - one-half of your plate: Aim for color and variety, and remember that potatoes dont count.   Go for whole grains - one-quarter of  your plate: Whole wheat, barley, wheat berries, quinoa, oats, brown rice, and foods made with them. If you want pasta, go with whole wheat pasta.   Protein power - one-quarter of your plate: Fish, chicken, beans, and nuts are all healthy, versatile protein sources. Limit red meat.   The diet, however, does go beyond the plate, offering a few other suggestions.   Use healthy plant oils, such as olive, canola, soy, corn, sunflower and peanut. Check the labels, and avoid partially hydrogenated oil, which have unhealthy trans fats.   If youre thirsty, drink water. Coffee and tea are good in moderation, but skip sugary drinks and limit milk and dairy products to one or two daily servings.   The type of carbohydrate in the diet is more important than the amount. Some sources of carbohydrates, such as vegetables, fruits, whole grains, and beans-are healthier than others.   Finally, stay active  Signed, Berniece Salines, DO  05/01/2020 11:56 AM    [place]

## 2020-05-01 NOTE — Patient Instructions (Signed)

## 2020-05-12 DIAGNOSIS — G4733 Obstructive sleep apnea (adult) (pediatric): Secondary | ICD-10-CM | POA: Diagnosis not present

## 2020-05-15 DIAGNOSIS — I781 Nevus, non-neoplastic: Secondary | ICD-10-CM | POA: Diagnosis not present

## 2020-05-16 DIAGNOSIS — G4733 Obstructive sleep apnea (adult) (pediatric): Secondary | ICD-10-CM | POA: Diagnosis not present

## 2020-05-27 DIAGNOSIS — Z20828 Contact with and (suspected) exposure to other viral communicable diseases: Secondary | ICD-10-CM | POA: Diagnosis not present

## 2020-05-27 DIAGNOSIS — J069 Acute upper respiratory infection, unspecified: Secondary | ICD-10-CM | POA: Diagnosis not present

## 2020-05-27 DIAGNOSIS — J0100 Acute maxillary sinusitis, unspecified: Secondary | ICD-10-CM | POA: Diagnosis not present

## 2020-06-09 DIAGNOSIS — G4733 Obstructive sleep apnea (adult) (pediatric): Secondary | ICD-10-CM | POA: Diagnosis not present

## 2020-06-14 DIAGNOSIS — E785 Hyperlipidemia, unspecified: Secondary | ICD-10-CM | POA: Diagnosis not present

## 2020-06-14 DIAGNOSIS — I700 Atherosclerosis of aorta: Secondary | ICD-10-CM | POA: Diagnosis not present

## 2020-06-14 DIAGNOSIS — K219 Gastro-esophageal reflux disease without esophagitis: Secondary | ICD-10-CM | POA: Diagnosis not present

## 2020-06-14 DIAGNOSIS — Z862 Personal history of diseases of the blood and blood-forming organs and certain disorders involving the immune mechanism: Secondary | ICD-10-CM | POA: Diagnosis not present

## 2020-06-14 DIAGNOSIS — Z6832 Body mass index (BMI) 32.0-32.9, adult: Secondary | ICD-10-CM | POA: Diagnosis not present

## 2020-06-14 DIAGNOSIS — I10 Essential (primary) hypertension: Secondary | ICD-10-CM | POA: Diagnosis not present

## 2020-06-14 DIAGNOSIS — R600 Localized edema: Secondary | ICD-10-CM | POA: Diagnosis not present

## 2020-06-14 DIAGNOSIS — E118 Type 2 diabetes mellitus with unspecified complications: Secondary | ICD-10-CM | POA: Diagnosis not present

## 2020-06-19 DIAGNOSIS — Z0000 Encounter for general adult medical examination without abnormal findings: Secondary | ICD-10-CM | POA: Diagnosis not present

## 2020-06-19 DIAGNOSIS — E785 Hyperlipidemia, unspecified: Secondary | ICD-10-CM | POA: Diagnosis not present

## 2020-06-19 DIAGNOSIS — Z9181 History of falling: Secondary | ICD-10-CM | POA: Diagnosis not present

## 2020-06-19 DIAGNOSIS — Z1331 Encounter for screening for depression: Secondary | ICD-10-CM | POA: Diagnosis not present

## 2020-06-19 DIAGNOSIS — E669 Obesity, unspecified: Secondary | ICD-10-CM | POA: Diagnosis not present

## 2020-06-21 DIAGNOSIS — Z6833 Body mass index (BMI) 33.0-33.9, adult: Secondary | ICD-10-CM | POA: Diagnosis not present

## 2020-06-21 DIAGNOSIS — Z1212 Encounter for screening for malignant neoplasm of rectum: Secondary | ICD-10-CM | POA: Diagnosis not present

## 2020-06-21 DIAGNOSIS — R93429 Abnormal radiologic findings on diagnostic imaging of unspecified kidney: Secondary | ICD-10-CM | POA: Diagnosis not present

## 2020-06-21 DIAGNOSIS — R600 Localized edema: Secondary | ICD-10-CM | POA: Diagnosis not present

## 2020-06-21 DIAGNOSIS — Z862 Personal history of diseases of the blood and blood-forming organs and certain disorders involving the immune mechanism: Secondary | ICD-10-CM | POA: Diagnosis not present

## 2020-06-26 DIAGNOSIS — K449 Diaphragmatic hernia without obstruction or gangrene: Secondary | ICD-10-CM | POA: Diagnosis not present

## 2020-06-26 DIAGNOSIS — D509 Iron deficiency anemia, unspecified: Secondary | ICD-10-CM | POA: Diagnosis not present

## 2020-06-26 DIAGNOSIS — R600 Localized edema: Secondary | ICD-10-CM | POA: Diagnosis not present

## 2020-06-26 DIAGNOSIS — Z6833 Body mass index (BMI) 33.0-33.9, adult: Secondary | ICD-10-CM | POA: Diagnosis not present

## 2020-06-28 DIAGNOSIS — I781 Nevus, non-neoplastic: Secondary | ICD-10-CM | POA: Diagnosis not present

## 2020-06-29 DIAGNOSIS — R109 Unspecified abdominal pain: Secondary | ICD-10-CM | POA: Diagnosis not present

## 2020-06-29 DIAGNOSIS — K449 Diaphragmatic hernia without obstruction or gangrene: Secondary | ICD-10-CM | POA: Diagnosis not present

## 2020-07-02 DIAGNOSIS — Z471 Aftercare following joint replacement surgery: Secondary | ICD-10-CM | POA: Diagnosis not present

## 2020-07-02 DIAGNOSIS — Z96641 Presence of right artificial hip joint: Secondary | ICD-10-CM | POA: Diagnosis not present

## 2020-07-04 DIAGNOSIS — E210 Primary hyperparathyroidism: Secondary | ICD-10-CM | POA: Diagnosis not present

## 2020-07-04 DIAGNOSIS — D351 Benign neoplasm of parathyroid gland: Secondary | ICD-10-CM | POA: Diagnosis not present

## 2020-07-06 DIAGNOSIS — K8010 Calculus of gallbladder with chronic cholecystitis without obstruction: Secondary | ICD-10-CM | POA: Diagnosis not present

## 2020-07-10 DIAGNOSIS — G4733 Obstructive sleep apnea (adult) (pediatric): Secondary | ICD-10-CM | POA: Diagnosis not present

## 2020-07-17 DIAGNOSIS — Z6833 Body mass index (BMI) 33.0-33.9, adult: Secondary | ICD-10-CM | POA: Diagnosis not present

## 2020-07-17 DIAGNOSIS — R600 Localized edema: Secondary | ICD-10-CM | POA: Diagnosis not present

## 2020-07-20 DIAGNOSIS — Z5181 Encounter for therapeutic drug level monitoring: Secondary | ICD-10-CM | POA: Diagnosis not present

## 2020-07-23 DIAGNOSIS — Z6832 Body mass index (BMI) 32.0-32.9, adult: Secondary | ICD-10-CM | POA: Diagnosis not present

## 2020-07-23 DIAGNOSIS — R600 Localized edema: Secondary | ICD-10-CM | POA: Diagnosis not present

## 2020-07-23 DIAGNOSIS — I10 Essential (primary) hypertension: Secondary | ICD-10-CM | POA: Diagnosis not present

## 2020-07-23 DIAGNOSIS — I2510 Atherosclerotic heart disease of native coronary artery without angina pectoris: Secondary | ICD-10-CM | POA: Diagnosis not present

## 2020-07-27 DIAGNOSIS — R600 Localized edema: Secondary | ICD-10-CM | POA: Diagnosis not present

## 2020-07-30 DIAGNOSIS — K8010 Calculus of gallbladder with chronic cholecystitis without obstruction: Secondary | ICD-10-CM | POA: Diagnosis not present

## 2020-07-30 DIAGNOSIS — Z79899 Other long term (current) drug therapy: Secondary | ICD-10-CM | POA: Diagnosis not present

## 2020-07-30 DIAGNOSIS — G4733 Obstructive sleep apnea (adult) (pediatric): Secondary | ICD-10-CM | POA: Diagnosis not present

## 2020-07-30 DIAGNOSIS — K219 Gastro-esophageal reflux disease without esophagitis: Secondary | ICD-10-CM | POA: Diagnosis not present

## 2020-07-30 DIAGNOSIS — Z7984 Long term (current) use of oral hypoglycemic drugs: Secondary | ICD-10-CM | POA: Diagnosis not present

## 2020-07-30 DIAGNOSIS — J449 Chronic obstructive pulmonary disease, unspecified: Secondary | ICD-10-CM | POA: Diagnosis not present

## 2020-07-30 DIAGNOSIS — I10 Essential (primary) hypertension: Secondary | ICD-10-CM | POA: Diagnosis not present

## 2020-07-30 DIAGNOSIS — E119 Type 2 diabetes mellitus without complications: Secondary | ICD-10-CM | POA: Diagnosis not present

## 2020-07-30 DIAGNOSIS — E785 Hyperlipidemia, unspecified: Secondary | ICD-10-CM | POA: Diagnosis not present

## 2020-08-08 ENCOUNTER — Ambulatory Visit: Payer: Self-pay | Admitting: Surgery

## 2020-08-08 DIAGNOSIS — E210 Primary hyperparathyroidism: Secondary | ICD-10-CM | POA: Diagnosis not present

## 2020-08-09 DIAGNOSIS — G4733 Obstructive sleep apnea (adult) (pediatric): Secondary | ICD-10-CM | POA: Diagnosis not present

## 2020-08-13 ENCOUNTER — Other Ambulatory Visit: Payer: Self-pay | Admitting: Surgery

## 2020-08-13 DIAGNOSIS — E210 Primary hyperparathyroidism: Secondary | ICD-10-CM

## 2020-08-20 DIAGNOSIS — G4733 Obstructive sleep apnea (adult) (pediatric): Secondary | ICD-10-CM | POA: Diagnosis not present

## 2020-08-23 NOTE — Progress Notes (Signed)
COVID Vaccine Completed: Date COVID Vaccine completed: Has received booster: COVID vaccine manufacturer: [employer]   Date of COVID positive in last 90 days:  PCP - Orlinda Blalock Cardiologist - Kardie Tobb  Chest x-ray - 03/22/20 Epic EKG - 06/26/19 Stress Test -  ECHO - 06/25/19 Epic Cardiac Cath - 06/27/19 Epic Pacemaker/ICD device last checked: Spinal Cord Stimulator:  Sleep Study - 08/23/19 Epic CPAP - BiPAP?  Fasting Blood Sugar -  Checks Blood Sugar _____ times a day  Blood Thinner Instructions: Aspirin Instructions: ? Last Dose:  Activity level:  Can go up a flight of stairs and perform activities of daily living without stopping and without symptoms of chest pain or shortness of breath.   Able to exercise without symptoms  Unable to go up a flight of stairs without symptoms of      Anesthesia review: hx of HTN, CAD, sleep apnea on BiPAP, dyspnea with exertion  Patient denies shortness of breath, fever, cough and chest pain at PAT appointment   Patient verbalized understanding of instructions that were given to them at the PAT appointment. Patient was also instructed that they will need to review over the PAT instructions again at home before surgery.

## 2020-08-23 NOTE — Patient Instructions (Addendum)
DUE TO COVID-19 ONLY ONE VISITOR IS ALLOWED TO COME WITH YOU AND STAY IN THE WAITING ROOM ONLY DURING PRE OP AND PROCEDURE.   **NO VISITORS ARE ALLOWED IN THE SHORT STAY AREA OR RECOVERY ROOM!!**       Your procedure is scheduled on:  09/03/20   Report to [facility] Main  Entrance    Report to admitting at 12:25 PM   Call this number if you have problems the morning of surgery [phone]   Do not eat food :After Midnight.   May have liquids until  11:25 AM  day of surgery  CLEAR LIQUID DIET  Foods Allowed                                                                     Foods Excluded  Water, Black Coffee and tea, regular and decaf             liquids that you cannot  Plain Jell-O in any flavor  (No red)                                    see through such as: Fruit ices (not with fruit pulp)                                            milk, soups, orange juice              Iced Popsicles (No red)                                               All solid food                                   Apple juices Sports drinks like Gatorade (No red) Lightly seasoned clear broth or consume(fat free) Sugar, honey syrup             Oral Hygiene is also important to reduce your risk of infection.                                    Remember - BRUSH YOUR TEETH THE MORNING OF SURGERY WITH YOUR REGULAR TOOTHPASTE   Do NOT smoke after Midnight   Take these medicines the morning of surgery with A SIP OF WATER: Alendronate, Isosorbide mononitrate, Pantoprazole, Rosuvastatin  DO NOT TAKE ANY ORAL DIABETIC MEDICATIONS DAY OF YOUR SURGERY                   How to Manage Your Diabetes Before and After Surgery  Why is it important to control my blood sugar before and after surgery? . Improving blood sugar levels before and after surgery helps healing and can limit problems. . A way of improving blood sugar control is  eating a healthy diet by: o  Eating less sugar and carbohydrates o   Increasing activity/exercise o  Talking with your doctor about reaching your blood sugar goals . High blood sugars (greater than 180 mg/dL) can raise your risk of infections and slow your recovery, so you will need to focus on controlling your diabetes during the weeks before surgery. . Make sure that the doctor who takes care of your diabetes knows about your planned surgery including the date and location.  How do I manage my blood sugar before surgery? . Check your blood sugar at least 4 times a day, starting 2 days before surgery, to make sure that the level is not too high or low. o Check your blood sugar the morning of your surgery when you wake up and every 2 hours until you get to the Short Stay unit. . If your blood sugar is less than 70 mg/dL, you will need to treat for low blood sugar: o Do not take insulin. o Treat a low blood sugar (less than 70 mg/dL) with  cup of clear juice (cranberry or apple), 4 glucose tablets, OR glucose gel. o Recheck blood sugar in 15 minutes after treatment (to make sure it is greater than 70 mg/dL). If your blood sugar is not greater than 70 mg/dL on recheck, call [phone] for further instructions. . Report your blood sugar to the short stay nurse when you get to Short Stay.  . If you are admitted to the hospital after surgery: o Your blood sugar will be checked by the staff and you will probably be given insulin after surgery (instead of oral diabetes medicines) to make sure you have good blood sugar levels. o The goal for blood sugar control after surgery is 80-180 mg/dL.   WHAT DO I DO ABOUT MY DIABETES MEDICATION?  Marland Kitchen Do not take oral diabetes medicines (pills) the morning of surgery.  . THE DAY BEFORE SURGERY, Take your Metformin as prescribed      . THE MORNING OF SURGERY:  Do not take Metformin                 You may not have any metal on your body including hair pins, jewelry, and body piercings             Do not wear make-up,  lotions, powders, perfumes, or deodorant             Do not wear nail polish.  Do not shave  48 hours prior to surgery.             Do not bring valuables to the hospital. [place].   Contacts, dentures or bridgework may not be worn into surgery.   Bring CPAP mask and tubing day of surgery    Patients discharged the day of surgery will not be allowed to drive home.   Special Instructions: Bring a copy of your healthcare power of attorney and living will documents         the day of surgery if you haven't  scanned them in before.              Please read over the following fact sheets you were given: IF YOU HAVE QUESTIONS ABOUT YOUR PRE OP INSTRUCTIONS PLEASE  CALL [place] - Preparing for Surgery Before surgery, you can play an important role.  Because skin is not sterile, your skin needs  to be as free of germs as possible.  You can reduce the number of germs on your skin by washing with CHG (chlorahexidine gluconate) soap before surgery.  CHG is an antiseptic cleaner which kills germs and bonds with the skin to continue killing germs even after washing. Please DO NOT use if you have an allergy to CHG or antibacterial soaps.  If your skin becomes reddened/irritated stop using the CHG and inform your nurse when you arrive at Short Stay. Do not shave (including legs and underarms) for at least 48 hours prior to the first CHG shower.  You may shave your face/neck.  Please follow these instructions carefully:  1.  Shower with CHG Soap the night before surgery and the  morning of surgery.  2.  If you choose to wash your hair, wash your hair first as usual with your normal  shampoo.  3.  After you shampoo, rinse your hair and body thoroughly to remove the shampoo.                             4.  Use CHG as you would any other liquid soap.  You can apply chg directly to the skin and wash.  Gently with a scrungie or clean washcloth.  5.   Apply the CHG Soap to your body ONLY FROM THE NECK DOWN.   Do   not use on face/ open                           Wound or open sores. Avoid contact with eyes, ears mouth and   genitals (private parts).                       Wash face,  Genitals (private parts) with your normal soap.             6.  Wash thoroughly, paying special attention to the area where your    surgery  will be performed.  7.  Thoroughly rinse your body with warm water from the neck down.  8.  DO NOT shower/wash with your normal soap after using and rinsing off the CHG Soap.                9.  Pat yourself dry with a clean towel.            10.  Wear clean pajamas.            11.  Place clean sheets on your bed the night of your first shower and do not  sleep with pets. Day of Surgery : Do not apply any lotions/deodorants the morning of surgery.  Please wear clean clothes to the hospital/surgery center.  FAILURE TO FOLLOW THESE INSTRUCTIONS MAY RESULT IN THE CANCELLATION OF YOUR SURGERY  PATIENT SIGNATURE_________________________________  NURSE SIGNATURE__________________________________  ________________________________________________________________________

## 2020-08-24 ENCOUNTER — Encounter (HOSPITAL_COMMUNITY)
Admission: RE | Admit: 2020-08-24 | Discharge: 2020-08-24 | Disposition: A | Discharge status: Home / Self Care | Payer: Medicare PPO | Source: Ambulatory Visit | Attending: Surgery | Admitting: Surgery

## 2020-08-24 ENCOUNTER — Other Ambulatory Visit: Payer: Self-pay

## 2020-08-24 ENCOUNTER — Encounter (HOSPITAL_COMMUNITY): Payer: Self-pay

## 2020-08-24 DIAGNOSIS — Z01818 Encounter for other preprocedural examination: Secondary | ICD-10-CM | POA: Diagnosis not present

## 2020-08-24 HISTORY — DX: Type 2 diabetes mellitus without complications: E11.9

## 2020-08-24 HISTORY — DX: COVID-19: U07.1

## 2020-08-24 LAB — BASIC METABOLIC PANEL
Anion gap: 5 (ref 5–15)
BUN: 17 mg/dL (ref 8–23)
CO2: 29 mmol/L (ref 22–32)
Calcium: 10.8 mg/dL — ABNORMAL HIGH (ref 8.9–10.3)
Chloride: 104 mmol/L (ref 98–111)
Creatinine, Ser: 1.07 mg/dL — ABNORMAL HIGH (ref 0.44–1.00)
GFR, Estimated: 55 mL/min — ABNORMAL LOW (ref >60)
Glucose, Bld: 110 mg/dL — ABNORMAL HIGH (ref 70–99)
Potassium: 4.4 mmol/L (ref 3.5–5.1)
Sodium: 138 mmol/L (ref 135–145)

## 2020-08-24 LAB — CBC
HCT: 36.1 % (ref 36.0–46.0)
Hemoglobin: 11.8 g/dL — ABNORMAL LOW (ref 12.0–15.0)
MCH: 28 pg (ref 26.0–34.0)
MCHC: 32.7 g/dL (ref 30.0–36.0)
MCV: 85.5 fL (ref 80.0–100.0)
Platelets: 225 10*3/uL (ref 150–400)
RBC: 4.22 MIL/uL (ref 3.87–5.11)
RDW: 16.8 % — ABNORMAL HIGH (ref 11.5–15.5)
WBC: 9.9 10*3/uL (ref 4.0–10.5)
nRBC: 0 % (ref 0.0–0.2)

## 2020-08-24 LAB — HEMOGLOBIN A1C
Hgb A1c MFr Bld: 6.3 % — ABNORMAL HIGH (ref 4.8–5.6)
Mean Plasma Glucose: 134 mg/dL

## 2020-08-24 LAB — GLUCOSE, CAPILLARY: Glucose-Capillary: 113 mg/dL — ABNORMAL HIGH (ref 70–99)

## 2020-08-24 NOTE — Progress Notes (Signed)
COVID Vaccine Completed: Yes x3 Date COVID Vaccine completed:  06-23-19, 07-18-19 Has received booster: 01-26-20 COVID vaccine manufacturer: [employer]      Date of COVID positive in last 90 days:  N/A  PCP - Orlinda Blalock Cardiologist - Kardie Tobb  Chest x-ray - 03/22/20 Epic EKG - 08-24-20 Epic Stress Test - N/A ECHO - 06/25/19 Epic Cardiac Cath - 06/27/19 Epic Pacemaker/ICD device last checked: Spinal Cord Stimulator:  Sleep Study - 08/23/19 Epic CPAP - BiPAP  Fasting Blood Sugar - 90 to 133 Checks Blood Sugar 1-2  times a day  Blood Thinner Instructions:N/A Aspirin Instructions: N/A Last Dose:  Activity level:   Can go up a flight of stairs and perform activities of daily living without symptoms of chest pain.  Patient stated that she does have dyspnea with exertion with some activities.  Is able to climb a flight of stairs without dyspnea but sometimes has to stop and rest when cleaning the house.  States this worsened after having Covid last year.   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                        Anesthesia review: hx of HTN, CAD, sleep apnea on BiPAP, dyspnea with exertion  Patient denies shortness of breath, fever, cough and chest pain at PAT appointment   Patient verbalized understanding of instructions that were given to them at the PAT appointment. Patient was also instructed that they will need to review over the PAT instructions again at home before surgery.

## 2020-08-29 ENCOUNTER — Ambulatory Visit
Admission: RE | Admit: 2020-08-29 | Discharge: 2020-08-29 | Disposition: A | Discharge status: Home / Self Care | Payer: Medicare PPO | Source: Ambulatory Visit | Attending: Surgery | Admitting: Surgery

## 2020-08-29 DIAGNOSIS — E041 Nontoxic single thyroid nodule: Secondary | ICD-10-CM | POA: Diagnosis not present

## 2020-08-29 DIAGNOSIS — E210 Primary hyperparathyroidism: Secondary | ICD-10-CM | POA: Diagnosis not present

## 2020-09-02 ENCOUNTER — Encounter (HOSPITAL_COMMUNITY): Payer: Self-pay | Admitting: Surgery

## 2020-09-02 DIAGNOSIS — E210 Primary hyperparathyroidism: Secondary | ICD-10-CM | POA: Diagnosis present

## 2020-09-02 HISTORY — DX: Primary hyperparathyroidism: E21.0

## 2020-09-02 NOTE — H&P (Signed)
General Surgery [facility]  Karen Baker DOB: 1950-11-27 Married / Language: English / Race: White Female   History of Present Illness   The patient is a 70 year old female who presents with primary hyperparathyroidism.  CHIEF COMPLAINT: primary hyperparathyroidism  Patient is referred by Orlinda Blalock, NP, for surgical evaluation and management of primary hyperparathyroidism. Patient was noted on routine laboratory testing to have an elevated serum calcium level. Her most recent level was 11.4. Subsequent testing revealed an uncinate press intact PTH level of 43. Vitamin D level was slightly low at 19.4 and she has been started on vitamin D supplementation. Patient underwent a nuclear medicine parathyroid scan on July 04, 2020. 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 Patient is now referred to surgery for evaluation and management. Patient has no prior history of endocrine disease. There is no family history of parathyroid disease. There is no history of other endocrine neoplasms. Patient does have significant fatigue. She denies nephrolithiasis. She does have some bone loss and has recently been started on Fosamax. Patient recently underwent gallbladder surgery by my partner, Dr. Orrin Brigham, in [place]. She has been very pleased with his care.   Problem List/Past Medical  PRIMARY HYPERPARATHYROIDISM (E21.0)   Past Surgical History  Foot Surgery  Bilateral. Gallbladder Surgery - Laparoscopic  Hip Surgery  Bilateral. Hysterectomy (not due to cancer) - Partial  Tonsillectomy   Diagnostic Studies History Colonoscopy  1-5 years ago Mammogram  within last year Pap Smear  >5 years ago  Allergies  Azithromycin *CHEMICALS*  Nitroglycerin *ANTIANGINAL AGENTS*  Allergies Reconciled   Medication History Janeann Forehand, CNA; 08/08/2020 10:29 AM) Mouthwash (Mouth/Throat) Active. Aspirin (60MG   Suppository, Rectal) Active. Isosorbide Active. Lasix (10MG /ML Solution, Injection) Active. Lisinopril (2.5MG  Tablet, Oral) Active. Pantoprazole Sodium (20MG  Tablet DR, Oral) Active. Potassium Chloride (0.1MEQ/ML Solution, Intravenous) Active. Vitamin C (500MG /5ML Syrup, Oral) Active. [employer] Firm Compression Socks Active. Crestor (5MG  Tablet, Oral) Active. Ferrous Aspartate (144MG  Tablet, Oral) Active. Fosamax (5MG  Tablet, Oral) Active. Isosorbide Dinitrate (5MG  Tablet, Oral) Active. Medications Reconciled  Social History  Alcohol use  Occasional alcohol use. Caffeine use  Carbonated beverages, Coffee. No drug use  Tobacco use  Former smoker.  Family History Arthritis  Daughter, Son. Cerebrovascular Accident  Mother. Hypertension  Mother. Ischemic Bowel Disease  Daughter, Son.  Pregnancy / Birth History  Age at menarche  51 years. Age of menopause  <43 Gravida  2 Irregular periods  Maternal age  64-20 Para  2  Other Problems  Arthritis  Cholelithiasis  Chronic Obstructive Lung Disease  Diabetes Mellitus  Gastroesophageal Reflux Disease   Review of Systems General Not Present- Appetite Loss, Chills, Fatigue, Fever, Night Sweats, Weight Gain and Weight Loss. Skin Present- Dryness. Not Present- Change in Wart/Mole, Hives, Jaundice, New Lesions, Non-Healing Wounds, Rash and Ulcer. HEENT Present- Hoarseness and Wears glasses/contact lenses. Not Present- Earache, Hearing Loss, Nose Bleed, Oral Ulcers, Ringing in the Ears, Seasonal Allergies, Sinus Pain, Sore Throat, Visual Disturbances and Yellow Eyes. Respiratory Present- Difficulty Breathing. Not Present- Bloody sputum, Chronic Cough, Snoring and Wheezing. Breast Not Present- Breast Mass, Breast Pain, Nipple Discharge and Skin Changes. Cardiovascular Present- Leg Cramps, Shortness of Breath and Swelling of Extremities. Not Present- Chest Pain, Difficulty Breathing Lying Down, Palpitations and  Rapid Heart Rate. Gastrointestinal Present- Constipation, Excessive gas and Indigestion. Not Present- Abdominal Pain, Bloating, Bloody Stool, Change in Bowel Habits, Chronic diarrhea, Difficulty Swallowing, Gets full quickly at meals, Hemorrhoids, Nausea,  Rectal Pain and Vomiting. Female Genitourinary Not Present- Frequency, Nocturia, Painful Urination, Pelvic Pain and Urgency. Musculoskeletal Present- Joint Pain and Swelling of Extremities. Not Present- Back Pain, Joint Stiffness, Muscle Pain and Muscle Weakness. Neurological Not Present- Decreased Memory, Fainting, Headaches, Numbness, Seizures, Tingling, Tremor, Trouble walking and Weakness. Psychiatric Not Present- Anxiety, Bipolar, Change in Sleep Pattern, Depression, Fearful and Frequent crying. Endocrine Present- New Diabetes. Not Present- Cold Intolerance, Excessive Hunger, Hair Changes, Heat Intolerance and Hot flashes. Hematology Not Present- Blood Thinners, Easy Bruising, Excessive bleeding, Gland problems, HIV and Persistent Infections.  Vitals Weight: 183.13 lb Height: 63in Body Surface Area: 1.86 m Body Mass Index: 32.44 kg/m  Temp.: 98.49F  Pulse: 82 (Regular)  P.OX: 98% (Room air) BP: 140/80(Sitting, Left Arm, Standard)  Physical Exam   GENERAL APPEARANCE Development: normal Nutritional status: normal Gross deformities: none  SKIN Rash, lesions, ulcers: none Induration, erythema: none Nodules: none palpable  EYES Conjunctiva and lids: normal Pupils: equal and reactive Iris: normal bilaterally  EARS, NOSE, MOUTH, THROAT External ears: no lesion or deformity External nose: no lesion or deformity Hearing: grossly normal Due to Covid-19 pandemic, patient is wearing a mask.  NECK Symmetric: yes Trachea: midline Thyroid: no palpable nodules in the thyroid bed  CHEST Respiratory effort: normal Retraction or accessory muscle use: no Breath sounds: normal bilaterally Rales, rhonchi, wheeze:  none  CARDIOVASCULAR Auscultation: regular rhythm, normal rate Murmurs: none Pulses: radial pulse 2+ palpable Lower extremity edema: none  MUSCULOSKELETAL Station and gait: normal Digits and nails: no clubbing or cyanosis Muscle strength: grossly normal all extremities Range of motion: grossly normal all extremities Deformity: none  LYMPHATIC Cervical: none palpable Supraclavicular: none palpable  PSYCHIATRIC Oriented to person, place, and time: yes Mood and affect: normal for situation Judgment and insight: appropriate for situation   Assessment & Plan   PRIMARY HYPERPARATHYROIDISM (E21.0)  Patient is referred by her primary care provider for surgical evaluation and management of newly diagnosed primary hyperparathyroidism.  Patient provided with a copy of "Parathyroid Surgery: Treatment for Your Parathyroid Gland Problem", published by [employer], 12 pages. Book reviewed and explained to patient during visit today.  Patient has biochemical evidence of primary hyperparathyroidism. Nuclear medicine parathyroid scan localized a right inferior parathyroid adenoma. We will obtain an ultrasound examination of the neck in hopes of confirming this location and to rule out concurrent thyroid disease. Today we discussed minimally invasive parathyroidectomy. We discussed the fact that 95% of patients have single gland disease. We discussed performing a minimally invasive parathyroidectomy as an outpatient surgical procedure. We discussed the size and location of the surgical incision. We discussed the risk and benefits of the procedure including the risk of recurrent laryngeal nerve injury. We discussed her postoperative recovery and return to activities. The patient understands and agrees to proceed in the near future.  The risks and benefits of the procedure have been discussed at length with the patient. The patient understands the proposed procedure, potential alternative  treatments, and the course of recovery to be expected. All of the patient's questions have been answered at this time. The patient wishes to proceed with surgery.  Armandina Gemma, MD [facility] Office: [phone]

## 2020-09-03 ENCOUNTER — Ambulatory Visit (HOSPITAL_COMMUNITY): Payer: Medicare PPO | Admitting: Physician Assistant

## 2020-09-03 ENCOUNTER — Encounter (HOSPITAL_COMMUNITY): Payer: Self-pay | Admitting: Surgery

## 2020-09-03 ENCOUNTER — Ambulatory Visit (HOSPITAL_COMMUNITY)
Admission: RE | Admit: 2020-09-03 | Discharge: 2020-09-03 | Disposition: A | Discharge status: Home / Self Care | Payer: Medicare PPO | Attending: Surgery | Admitting: Surgery

## 2020-09-03 ENCOUNTER — Ambulatory Visit (HOSPITAL_COMMUNITY): Payer: Medicare PPO | Admitting: Registered Nurse

## 2020-09-03 ENCOUNTER — Encounter (HOSPITAL_COMMUNITY)
Admission: RE | Disposition: A | Discharge status: Home / Self Care | Payer: Self-pay | Source: Home / Self Care | Attending: Surgery

## 2020-09-03 DIAGNOSIS — Z881 Allergy status to other antibiotic agents status: Secondary | ICD-10-CM | POA: Diagnosis not present

## 2020-09-03 DIAGNOSIS — E119 Type 2 diabetes mellitus without complications: Secondary | ICD-10-CM | POA: Diagnosis not present

## 2020-09-03 DIAGNOSIS — K219 Gastro-esophageal reflux disease without esophagitis: Secondary | ICD-10-CM | POA: Diagnosis not present

## 2020-09-03 DIAGNOSIS — Z823 Family history of stroke: Secondary | ICD-10-CM | POA: Insufficient documentation

## 2020-09-03 DIAGNOSIS — Z9989 Dependence on other enabling machines and devices: Secondary | ICD-10-CM | POA: Diagnosis not present

## 2020-09-03 DIAGNOSIS — Z886 Allergy status to analgesic agent status: Secondary | ICD-10-CM | POA: Diagnosis not present

## 2020-09-03 DIAGNOSIS — E210 Primary hyperparathyroidism: Secondary | ICD-10-CM | POA: Diagnosis not present

## 2020-09-03 DIAGNOSIS — D351 Benign neoplasm of parathyroid gland: Secondary | ICD-10-CM | POA: Diagnosis not present

## 2020-09-03 DIAGNOSIS — Z91048 Other nonmedicinal substance allergy status: Secondary | ICD-10-CM | POA: Insufficient documentation

## 2020-09-03 DIAGNOSIS — Z7982 Long term (current) use of aspirin: Secondary | ICD-10-CM | POA: Diagnosis not present

## 2020-09-03 DIAGNOSIS — G4733 Obstructive sleep apnea (adult) (pediatric): Secondary | ICD-10-CM | POA: Diagnosis not present

## 2020-09-03 DIAGNOSIS — Z888 Allergy status to other drugs, medicaments and biological substances status: Secondary | ICD-10-CM | POA: Diagnosis not present

## 2020-09-03 DIAGNOSIS — Z79899 Other long term (current) drug therapy: Secondary | ICD-10-CM | POA: Insufficient documentation

## 2020-09-03 DIAGNOSIS — I10 Essential (primary) hypertension: Secondary | ICD-10-CM | POA: Diagnosis not present

## 2020-09-03 DIAGNOSIS — Z87891 Personal history of nicotine dependence: Secondary | ICD-10-CM | POA: Insufficient documentation

## 2020-09-03 DIAGNOSIS — Z8261 Family history of arthritis: Secondary | ICD-10-CM | POA: Insufficient documentation

## 2020-09-03 DIAGNOSIS — Z8249 Family history of ischemic heart disease and other diseases of the circulatory system: Secondary | ICD-10-CM | POA: Diagnosis not present

## 2020-09-03 DIAGNOSIS — Z8379 Family history of other diseases of the digestive system: Secondary | ICD-10-CM | POA: Insufficient documentation

## 2020-09-03 HISTORY — PX: PARATHYROIDECTOMY: SHX19

## 2020-09-03 LAB — GLUCOSE, CAPILLARY
Glucose-Capillary: 90 mg/dL (ref 70–99)
Glucose-Capillary: 110 mg/dL — ABNORMAL HIGH (ref 70–99)

## 2020-09-03 SURGERY — PARATHYROIDECTOMY
Anesthesia: General | Site: Neck | Laterality: Right

## 2020-09-03 MED ORDER — PHENYLEPHRINE HCL-NACL 20-0.9 MG/250ML-% IV SOLN
INTRAVENOUS | Status: DC | PRN
  Administered 2020-09-03: 20 ug/min via INTRAVENOUS

## 2020-09-03 MED ORDER — ROCURONIUM BROMIDE 10 MG/ML (PF) SYRINGE
PREFILLED_SYRINGE | INTRAVENOUS | Status: AC
  Filled 2020-09-03: qty 10

## 2020-09-03 MED ORDER — BUPIVACAINE HCL 0.5 % IJ SOLN
INTRAMUSCULAR | Status: DC | PRN
  Administered 2020-09-03: 10 mL

## 2020-09-03 MED ORDER — FENTANYL CITRATE (PF) 100 MCG/2ML IJ SOLN: 25.0000 ug | INTRAMUSCULAR | Status: DC | PRN

## 2020-09-03 MED ORDER — CHLORHEXIDINE GLUCONATE CLOTH 2 % EX PADS: 6.0000 | MEDICATED_PAD | Freq: Once | CUTANEOUS | Status: DC

## 2020-09-03 MED ORDER — ORAL CARE MOUTH RINSE: 15.0000 mL | Freq: Once | OROMUCOSAL | Status: AC

## 2020-09-03 MED ORDER — ONDANSETRON HCL 4 MG/2ML IJ SOLN
INTRAMUSCULAR | Status: AC
  Filled 2020-09-03: qty 2

## 2020-09-03 MED ORDER — 0.9 % SODIUM CHLORIDE (POUR BTL) OPTIME
TOPICAL | Status: DC | PRN
  Administered 2020-09-03: 1000 mL

## 2020-09-03 MED ORDER — BUPIVACAINE HCL (PF) 0.5 % IJ SOLN
INTRAMUSCULAR | Status: AC
  Filled 2020-09-03: qty 30

## 2020-09-03 MED ORDER — TRAMADOL HCL 50 MG PO TABS: 50.0000 mg | ORAL_TABLET | Freq: Four times a day (QID) | ORAL | 0 refills | Status: DC | PRN

## 2020-09-03 MED ORDER — BUPIVACAINE HCL 0.25 % IJ SOLN
INTRAMUSCULAR | Status: AC
  Filled 2020-09-03: qty 1

## 2020-09-03 MED ORDER — FENTANYL CITRATE (PF) 100 MCG/2ML IJ SOLN
INTRAMUSCULAR | Status: AC
  Filled 2020-09-03: qty 2

## 2020-09-03 MED ORDER — PROPOFOL 10 MG/ML IV BOLUS
INTRAVENOUS | Status: DC | PRN
  Administered 2020-09-03: 150 mg via INTRAVENOUS

## 2020-09-03 MED ORDER — FENTANYL CITRATE (PF) 100 MCG/2ML IJ SOLN
INTRAMUSCULAR | Status: DC | PRN
  Administered 2020-09-03: 100 ug via INTRAVENOUS

## 2020-09-03 MED ORDER — LIDOCAINE 2% (20 MG/ML) 5 ML SYRINGE
INTRAMUSCULAR | Status: DC | PRN
  Administered 2020-09-03: 80 mg via INTRAVENOUS

## 2020-09-03 MED ORDER — CELECOXIB 200 MG PO CAPS
200.0000 mg | ORAL_CAPSULE | Freq: Once | ORAL | Status: AC
  Administered 2020-09-03: 200 mg via ORAL
  Filled 2020-09-03: qty 1

## 2020-09-03 MED ORDER — LIDOCAINE HCL (PF) 2 % IJ SOLN
INTRAMUSCULAR | Status: AC
  Filled 2020-09-03: qty 5

## 2020-09-03 MED ORDER — ROCURONIUM BROMIDE 10 MG/ML (PF) SYRINGE
PREFILLED_SYRINGE | INTRAVENOUS | Status: DC | PRN
  Administered 2020-09-03: 80 mg via INTRAVENOUS

## 2020-09-03 MED ORDER — CEFAZOLIN SODIUM-DEXTROSE 2-4 GM/100ML-% IV SOLN
2.0000 g | INTRAVENOUS | Status: AC
  Administered 2020-09-03: 2 g via INTRAVENOUS
  Filled 2020-09-03: qty 100

## 2020-09-03 MED ORDER — CHLORHEXIDINE GLUCONATE 0.12 % MT SOLN
15.0000 mL | Freq: Once | OROMUCOSAL | Status: AC
  Administered 2020-09-03: 15 mL via OROMUCOSAL

## 2020-09-03 MED ORDER — LACTATED RINGERS IV SOLN: INTRAVENOUS | Status: DC

## 2020-09-03 MED ORDER — DIPHENHYDRAMINE HCL 50 MG/ML IJ SOLN
INTRAMUSCULAR | Status: AC
  Filled 2020-09-03: qty 1

## 2020-09-03 MED ORDER — MIDAZOLAM HCL 2 MG/2ML IJ SOLN
INTRAMUSCULAR | Status: AC
  Filled 2020-09-03: qty 2

## 2020-09-03 MED ORDER — DEXAMETHASONE SODIUM PHOSPHATE 10 MG/ML IJ SOLN
INTRAMUSCULAR | Status: AC
  Filled 2020-09-03: qty 1

## 2020-09-03 MED ORDER — ACETAMINOPHEN 500 MG PO TABS
1000.0000 mg | ORAL_TABLET | Freq: Once | ORAL | Status: AC
  Administered 2020-09-03: 1000 mg via ORAL
  Filled 2020-09-03: qty 2

## 2020-09-03 MED ORDER — EPHEDRINE SULFATE-NACL 50-0.9 MG/10ML-% IV SOSY
PREFILLED_SYRINGE | INTRAVENOUS | Status: DC | PRN
  Administered 2020-09-03 (×2): 5 mg via INTRAVENOUS

## 2020-09-03 MED ORDER — DEXAMETHASONE SODIUM PHOSPHATE 10 MG/ML IJ SOLN
INTRAMUSCULAR | Status: DC | PRN
  Administered 2020-09-03: 5 mg via INTRAVENOUS

## 2020-09-03 MED ORDER — DIPHENHYDRAMINE HCL 50 MG/ML IJ SOLN
INTRAMUSCULAR | Status: DC | PRN
  Administered 2020-09-03: 6.25 mg via INTRAVENOUS

## 2020-09-03 MED ORDER — ONDANSETRON HCL 4 MG/2ML IJ SOLN
INTRAMUSCULAR | Status: DC | PRN
  Administered 2020-09-03: 4 mg via INTRAVENOUS

## 2020-09-03 MED ORDER — MIDAZOLAM HCL 5 MG/5ML IJ SOLN
INTRAMUSCULAR | Status: DC | PRN
  Administered 2020-09-03: 1 mg via INTRAVENOUS

## 2020-09-03 MED ORDER — SUGAMMADEX SODIUM 200 MG/2ML IV SOLN
INTRAVENOUS | Status: DC | PRN
  Administered 2020-09-03: 200 mg via INTRAVENOUS

## 2020-09-03 SURGICAL SUPPLY — 30 items
ATTRACTOMAT 16X20 MAGNETIC DRP (DRAPES) ×2 IMPLANT
BLADE SURG 15 STRL LF DISP TIS (BLADE) ×1 IMPLANT
BLADE SURG 15 STRL SS (BLADE) ×1
CHLORAPREP W/TINT 26 (MISCELLANEOUS) ×2 IMPLANT
CLIP VESOCCLUDE MED 6/CT (CLIP) ×4 IMPLANT
CLIP VESOCCLUDE SM WIDE 6/CT (CLIP) ×4 IMPLANT
COVER SURGICAL LIGHT HANDLE (MISCELLANEOUS) ×2 IMPLANT
COVER WAND RF STERILE (DRAPES) IMPLANT
DERMABOND ADVANCED (GAUZE/BANDAGES/DRESSINGS) ×1
DERMABOND ADVANCED .7 DNX12 (GAUZE/BANDAGES/DRESSINGS) ×1 IMPLANT
DRAPE LAPAROTOMY T 98X78 PEDS (DRAPES) ×2 IMPLANT
DRAPE UTILITY XL STRL (DRAPES) ×2 IMPLANT
ELECT REM PT RETURN 15FT ADLT (MISCELLANEOUS) ×2 IMPLANT
GAUZE 4X4 16PLY RFD (DISPOSABLE) ×2 IMPLANT
GLOVE SURG ORTHO LTX SZ8 (GLOVE) ×2 IMPLANT
GOWN STRL REUS W/TWL XL LVL3 (GOWN DISPOSABLE) ×6 IMPLANT
HEMOSTAT SURGICEL 2X4 FIBR (HEMOSTASIS) ×2 IMPLANT
ILLUMINATOR WAVEGUIDE N/F (MISCELLANEOUS) IMPLANT
KIT BASIN OR (CUSTOM PROCEDURE TRAY) ×2 IMPLANT
KIT TURNOVER KIT A (KITS) ×2 IMPLANT
NEEDLE HYPO 25X1 1.5 SAFETY (NEEDLE) ×2 IMPLANT
PACK BASIC VI WITH GOWN DISP (CUSTOM PROCEDURE TRAY) ×2 IMPLANT
PENCIL SMOKE EVACUATOR (MISCELLANEOUS) ×2 IMPLANT
SUT MNCRL AB 4-0 PS2 18 (SUTURE) ×2 IMPLANT
SUT VIC AB 3-0 SH 18 (SUTURE) ×2 IMPLANT
SYR BULB IRRIG 60ML STRL (SYRINGE) ×2 IMPLANT
SYR CONTROL 10ML LL (SYRINGE) ×2 IMPLANT
TOWEL OR 17X26 10 PK STRL BLUE (TOWEL DISPOSABLE) ×2 IMPLANT
TOWEL OR NON WOVEN STRL DISP B (DISPOSABLE) ×2 IMPLANT
TUBING CONNECTING 10 (TUBING) ×2 IMPLANT

## 2020-09-03 NOTE — Anesthesia Procedure Notes (Signed)
Procedure Name: Intubation Date/Time: 09/03/2020 2:06 PM Performed by: Victoriano Lain, CRNA Pre-anesthesia Checklist: Patient identified, Emergency Drugs available, Suction available, Patient being monitored and Timeout performed Patient Re-evaluated:Patient Re-evaluated prior to induction Oxygen Delivery Method: Circle system utilized Preoxygenation: Pre-oxygenation with 100% oxygen Induction Type: IV induction Laryngoscope Size: Mac and 4 Grade View: Grade I Tube type: Oral Tube size: 7.5 mm Number of attempts: 1 Airway Equipment and Method: Stylet Placement Confirmation: ETT inserted through vocal cords under direct vision,  positive ETCO2 and breath sounds checked- equal and bilateral Secured at: 21 cm Tube secured with: Tape Dental Injury: Teeth and Oropharynx as per pre-operative assessment

## 2020-09-03 NOTE — Op Note (Signed)
OPERATIVE REPORT - PARATHYROIDECTOMY  Preoperative diagnosis: Primary hyperparathyroidism  Postop diagnosis: Same  Procedure: Right superior minimally invasive parathyroidectomy  Surgeon:  Armandina Gemma, MD  Anesthesia: General endotracheal  Estimated blood loss: Minimal  Preparation: ChloraPrep  Indications: Patient is referred by Orlinda Blalock, NP, for surgical evaluation and management of primary hyperparathyroidism. Patient was noted on routine laboratory testing to have an elevated serum calcium level. Her most recent level was 11.4. Subsequent testing revealed an uncinate press intact PTH level of 43. Vitamin D level was slightly low at 19.4 and she has been started on vitamin D supplementation. Patient underwent a nuclear medicine parathyroid scan on July 04, 2020. 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 Patient is now referred to surgery for evaluation and management.   Procedure: The patient was prepared in the pre-operative holding area. The patient was brought to the operating room and placed in a supine position on the operating room table. Following administration of general anesthesia, the patient was positioned and then prepped and draped in the usual strict aseptic fashion. After ascertaining that an adequate level of anesthesia been achieved, a neck incision was made with a #15 blade. Dissection was carried through subcutaneous tissues and platysma. Hemostasis was obtained with the electrocautery. Skin flaps were developed circumferentially and a [place]lander retractor was placed for exposure.  Strap muscles were incised in the midline. Strap muscles were reflected laterally exposing the thyroid lobe. With gentle blunt dissection the thyroid lobe was mobilized.  Dissection was carried through adipose tissue and a normal parathyroid gland was identified in the left inferior position, just below the thyroid lobe and adjacent to the trachea.  The  right thyroid lobe was then mobilized more fully, dividing venous tributaries between small ligaclips.  Posterior to the superior pole was an enlarged parathyroid gland. It was gently mobilized. Vascular structures were divided between small ligaclips. Care was taken to avoid the recurrent laryngeal nerve and the esophagus. The parathyroid gland was completely excised. It was submitted to pathology where frozen section confirmed parathyroid tissue consistent with adenoma.  Neck was irrigated with warm saline and good hemostasis was noted. Fibrillar was placed in the operative field. Strap muscles were approximated in the midline with interrupted 3-0 Vicryl sutures. Platysma was closed with interrupted 3-0 Vicryl sutures. Marcaine was infiltrated circumferentially. Skin was closed with a running 4-0 Monocryl subcuticular suture. Wound was washed and dried and Dermabond was applied. Patient was awakened from anesthesia and brought to the recovery room. The patient tolerated the procedure well.   Armandina Gemma, MD [facility] Office: [phone]

## 2020-09-03 NOTE — Anesthesia Preprocedure Evaluation (Addendum)
Anesthesia Evaluation  Patient identified by MRN, date of birth, ID band Patient awake    Reviewed: Allergy & Precautions, NPO status , Patient's Chart, lab work & pertinent test results  History of Anesthesia Complications Negative for: history of anesthetic complications  Airway Mallampati: II  TM Distance: >3 FB Neck ROM: Full    Dental  (+) Dental Advisory Given, Partial Lower, Partial Upper   Pulmonary sleep apnea , former smoker,    Pulmonary exam normal        Cardiovascular hypertension, Pt. on medications Normal cardiovascular exam  1. Nonobstructive CAD 2. Normal LV function 3. Elevated LVEDP 24 mm Hg.  Plan: medical management. Patient may be a candidate for DC later today.     Neuro/Psych Anxiety negative neurological ROS     GI/Hepatic Neg liver ROS, GERD  ,  Endo/Other  diabetes  Renal/GU negative Renal ROS     Musculoskeletal negative musculoskeletal ROS (+)   Abdominal   Peds  Hematology negative hematology ROS (+)   Anesthesia Other Findings   Reproductive/Obstetrics                           Anesthesia Physical Anesthesia Plan  ASA: III  Anesthesia Plan: General   Post-op Pain Management:    Induction: Intravenous  PONV Risk Score and Plan: 4 or greater and Ondansetron, Dexamethasone and Diphenhydramine  Airway Management Planned: Oral ETT  Additional Equipment:   Intra-op Plan:   Post-operative Plan: Extubation in OR  Informed Consent: I have reviewed the patients History and Physical, chart, labs and discussed the procedure including the risks, benefits and alternatives for the proposed anesthesia with the patient or authorized representative who has indicated his/her understanding and acceptance.     Dental advisory given  Plan Discussed with: Anesthesiologist, CRNA and Surgeon  Anesthesia Plan Comments: (Discussed patients past experience  with eye tape causing bruising and pain post op.  Unfortunately, this procedure (surgery close to the head) requires [place] to protect her cornea with tape.  We will use specific eye tape and try to remove it carefully, but it may occur again.  )      Anesthesia Quick Evaluation

## 2020-09-03 NOTE — Anesthesia Postprocedure Evaluation (Signed)
Anesthesia Post Note  Patient: Karen Baker  Procedure(s) Performed: RIGHT INFERIOR PARATHYROIDECTOMY (Right Neck)     Patient location during evaluation: PACU Anesthesia Type: General Level of consciousness: awake and alert Pain management: pain level controlled Vital Signs Assessment: post-procedure vital signs reviewed and stable Respiratory status: spontaneous breathing, nonlabored ventilation, respiratory function stable and patient connected to nasal cannula oxygen Cardiovascular status: blood pressure returned to baseline and stable Postop Assessment: no apparent nausea or vomiting Anesthetic complications: no Comments: Pt experienced ecchymosis due to taping her eyes despite careful application and removal of special eye tape.  We discussed the potential for this and the necessity of protection of her eyes during surgery of the head and neck area.   No complications documented.  Last Vitals:  Vitals:   09/03/20 1530 09/03/20 1545  BP: 139/74 132/74  Pulse: 67 72  Resp: 15 17  Temp:  37 C  SpO2: 100% 93%    Last Pain:  Vitals:   09/03/20 1545  TempSrc:   PainSc: 0-No pain                 Hikari Tripp DANIEL

## 2020-09-03 NOTE — Transfer of Care (Signed)
Immediate Anesthesia Transfer of Care Note  Patient: [facility]  Procedure(s) Performed: RIGHT INFERIOR PARATHYROIDECTOMY (Right Neck)  Patient Location: PACU  Anesthesia Type:General  Level of Consciousness: awake, alert  and oriented  Airway & Oxygen Therapy: Patient Spontanous Breathing and Patient connected to face mask oxygen  Post-op Assessment: Report given to RN and Post -op Vital signs reviewed and stable  Post vital signs: Reviewed and stable  Last Vitals:  Vitals Value Taken Time  BP 149/76 09/03/20 1517  Temp    Pulse 73 09/03/20 1518  Resp 15 09/03/20 1518  SpO2 100 % 09/03/20 1518  Vitals shown include unvalidated device data.  Last Pain:  Vitals:   09/03/20 1254  TempSrc:   PainSc: 0-No pain         Complications: No complications documented.

## 2020-09-03 NOTE — Interval H&P Note (Signed)
History and Physical Interval Note:  09/03/2020 1:48 PM  Karen Baker  has presented today for surgery, with the diagnosis of primary hyperparathyroidism.  The various methods of treatment have been discussed with the patient and family. After consideration of risks, benefits and other options for treatment, the patient has consented to    Procedure(s): RIGHT INFERIOR PARATHYROIDECTOMY (Right) as a surgical intervention.    The patient's history has been reviewed, patient examined, no change in status, stable for surgery.  I have reviewed the patient's chart and labs.  Questions were answered to the patient's satisfaction.    Armandina Gemma, MD [facility] Office: [place]

## 2020-09-03 NOTE — Discharge Instructions (Signed)
[facility]  THYROID & PARATHYROID SURGERY:  POST-OP INSTRUCTIONS  Always review your discharge instruction sheet from the facility where your surgery was performed.  A prescription for pain medication may be given to you upon discharge.  Take your pain medication as prescribed.  If narcotic pain medicine is not needed, then you may take acetaminophen (Tylenol) or ibuprofen (Advil) as needed.  Take your usually prescribed medications unless otherwise directed.  If you need a refill on your pain medication, please contact our office during regular business hours.  Prescriptions cannot be processed by our office after 5 pm or on weekends.  Start with a light diet upon arrival home, such as soup and crackers or toast.  Be sure to drink plenty of fluids daily.  Resume your normal diet the day after surgery.  Most patients will experience some swelling and bruising on the chest and neck area.  Ice packs will help.  Swelling and bruising can take several days to resolve.   It is common to experience some constipation after surgery.  Increasing fluid intake and taking a stool softener (Colace) will usually help or prevent this problem.  A mild laxative (Milk of Magnesia or Miralax) should be taken according to package directions if there has been no bowel movement after 48 hours.  You have steri-strips and a gauze dressing over your incision.  You may remove the gauze bandage on the second day after surgery, and you may shower at that time.  Leave your steri-strips (small skin tapes) in place directly over the incision.  These strips should remain on the skin for 5-7 days and then be removed.  You may get them wet in the shower and pat them dry.  You may resume regular (light) daily activities beginning the next day (such as daily self-care, walking, climbing stairs) gradually increasing activities as tolerated.  You may have sexual intercourse when it is comfortable.  Refrain from  any heavy lifting or straining until approved by your doctor.  You may drive when you no longer are taking prescription pain medication, you can comfortably wear a seatbelt, and you can safely maneuver your car and apply brakes.  You should see your doctor in the office for a follow-up appointment approximately three weeks after your surgery.  Make sure that you call for this appointment within a day or two after you arrive home to insure a convenient appointment time.  WHEN TO CALL YOUR DOCTOR: -- Fever greater than 101.5 -- Inability to urinate -- Nausea and/or vomiting - persistent -- Extreme swelling or bruising -- Continued bleeding from incision -- Increased pain, redness, or drainage from the incision -- Difficulty swallowing or breathing -- Muscle cramping or spasms -- Numbness or tingling in hands or around lips  The clinic staff is available to answer your questions during regular business hours.  Please don't hesitate to call and ask to speak to one of the nurses if you have concerns.  Armandina Gemma, MD [facility] Office: [place]  THYROID & PARATHYROID SURGERY:  POST-OP INSTRUCTIONS  Always review your discharge instruction sheet from the facility where your surgery was performed.  A prescription for pain medication may be given to you upon discharge.  Take your pain medication as prescribed.  If narcotic pain medicine is not needed, then you may take acetaminophen (Tylenol) or ibuprofen (Advil) as needed.  Take your usually prescribed medications unless otherwise directed.  If you need a refill on  your pain medication, please contact our office during regular business hours.  Prescriptions cannot be processed by our office after 5 pm or on weekends.  Start with a light diet upon arrival home, such as soup and crackers or toast.  Be sure to drink plenty of fluids daily.  Resume your normal diet the day after  surgery.  Most patients will experience some swelling and bruising on the chest and neck area.  Ice packs will help.  Swelling and bruising can take several days to resolve.   It is common to experience some constipation after surgery.  Increasing fluid intake and taking a stool softener (Colace) will usually help or prevent this problem.  A mild laxative (Milk of Magnesia or Miralax) should be taken according to package directions if there has been no bowel movement after 48 hours.  You have steri-strips and a gauze dressing over your incision.  You may remove the gauze bandage on the second day after surgery, and you may shower at that time.  Leave your steri-strips (small skin tapes) in place directly over the incision.  These strips should remain on the skin for 5-7 days and then be removed.  You may get them wet in the shower and pat them dry.  You may resume regular (light) daily activities beginning the next day (such as daily self-care, walking, climbing stairs) gradually increasing activities as tolerated.  You may have sexual intercourse when it is comfortable.  Refrain from any heavy lifting or straining until approved by your doctor.  You may drive when you no longer are taking prescription pain medication, you can comfortably wear a seatbelt, and you can safely maneuver your car and apply brakes.  You should see your doctor in the office for a follow-up appointment approximately three weeks after your surgery.  Make sure that you call for this appointment within a day or two after you arrive home to insure a convenient appointment time.  WHEN TO CALL YOUR DOCTOR: -- Fever greater than 101.5 -- Inability to urinate -- Nausea and/or vomiting - persistent -- Extreme swelling or bruising -- Continued bleeding from incision -- Increased pain, redness, or drainage from the incision -- Difficulty swallowing or breathing -- Muscle cramping or spasms -- Numbness or tingling in hands or  around lips  The clinic staff is available to answer your questions during regular business hours.  Please don't hesitate to call and ask to speak to one of the nurses if you have concerns.  Armandina Gemma, MD [facility] Office: [phone]

## 2020-09-04 ENCOUNTER — Encounter (HOSPITAL_COMMUNITY): Payer: Self-pay | Admitting: Surgery

## 2020-09-04 LAB — SURGICAL PATHOLOGY

## 2020-09-09 DIAGNOSIS — G4733 Obstructive sleep apnea (adult) (pediatric): Secondary | ICD-10-CM | POA: Diagnosis not present

## 2020-09-13 DIAGNOSIS — I10 Essential (primary) hypertension: Secondary | ICD-10-CM | POA: Diagnosis not present

## 2020-09-13 DIAGNOSIS — M79671 Pain in right foot: Secondary | ICD-10-CM | POA: Diagnosis not present

## 2020-09-13 DIAGNOSIS — Z6833 Body mass index (BMI) 33.0-33.9, adult: Secondary | ICD-10-CM | POA: Diagnosis not present

## 2020-09-13 DIAGNOSIS — D509 Iron deficiency anemia, unspecified: Secondary | ICD-10-CM | POA: Diagnosis not present

## 2020-09-13 DIAGNOSIS — E785 Hyperlipidemia, unspecified: Secondary | ICD-10-CM | POA: Diagnosis not present

## 2020-09-13 DIAGNOSIS — E118 Type 2 diabetes mellitus with unspecified complications: Secondary | ICD-10-CM | POA: Diagnosis not present

## 2020-09-13 DIAGNOSIS — E559 Vitamin D deficiency, unspecified: Secondary | ICD-10-CM | POA: Diagnosis not present

## 2020-09-13 DIAGNOSIS — M79672 Pain in left foot: Secondary | ICD-10-CM | POA: Diagnosis not present

## 2020-09-13 DIAGNOSIS — E892 Postprocedural hypoparathyroidism: Secondary | ICD-10-CM | POA: Diagnosis not present

## 2020-09-17 DIAGNOSIS — M79672 Pain in left foot: Secondary | ICD-10-CM | POA: Diagnosis not present

## 2020-09-17 DIAGNOSIS — M79671 Pain in right foot: Secondary | ICD-10-CM | POA: Diagnosis not present

## 2020-09-18 DIAGNOSIS — D72829 Elevated white blood cell count, unspecified: Secondary | ICD-10-CM | POA: Diagnosis not present

## 2020-09-24 DIAGNOSIS — E210 Primary hyperparathyroidism: Secondary | ICD-10-CM | POA: Diagnosis not present

## 2020-10-07 ENCOUNTER — Other Ambulatory Visit: Payer: Self-pay | Admitting: Cardiology

## 2020-10-09 DIAGNOSIS — G4733 Obstructive sleep apnea (adult) (pediatric): Secondary | ICD-10-CM | POA: Diagnosis not present

## 2020-11-21 DIAGNOSIS — G4733 Obstructive sleep apnea (adult) (pediatric): Secondary | ICD-10-CM | POA: Diagnosis not present

## 2020-12-18 DIAGNOSIS — R93429 Abnormal radiologic findings on diagnostic imaging of unspecified kidney: Secondary | ICD-10-CM | POA: Diagnosis not present

## 2020-12-18 DIAGNOSIS — I10 Essential (primary) hypertension: Secondary | ICD-10-CM | POA: Diagnosis not present

## 2020-12-18 DIAGNOSIS — E538 Deficiency of other specified B group vitamins: Secondary | ICD-10-CM | POA: Diagnosis not present

## 2020-12-18 DIAGNOSIS — E118 Type 2 diabetes mellitus with unspecified complications: Secondary | ICD-10-CM | POA: Diagnosis not present

## 2020-12-18 DIAGNOSIS — E559 Vitamin D deficiency, unspecified: Secondary | ICD-10-CM | POA: Diagnosis not present

## 2020-12-18 DIAGNOSIS — G629 Polyneuropathy, unspecified: Secondary | ICD-10-CM | POA: Diagnosis not present

## 2020-12-18 DIAGNOSIS — Z6833 Body mass index (BMI) 33.0-33.9, adult: Secondary | ICD-10-CM | POA: Diagnosis not present

## 2020-12-18 DIAGNOSIS — K219 Gastro-esophageal reflux disease without esophagitis: Secondary | ICD-10-CM | POA: Diagnosis not present

## 2020-12-18 DIAGNOSIS — E785 Hyperlipidemia, unspecified: Secondary | ICD-10-CM | POA: Diagnosis not present

## 2020-12-31 DIAGNOSIS — Z9049 Acquired absence of other specified parts of digestive tract: Secondary | ICD-10-CM | POA: Diagnosis not present

## 2020-12-31 DIAGNOSIS — N281 Cyst of kidney, acquired: Secondary | ICD-10-CM | POA: Diagnosis not present

## 2020-12-31 DIAGNOSIS — K449 Diaphragmatic hernia without obstruction or gangrene: Secondary | ICD-10-CM | POA: Diagnosis not present

## 2020-12-31 DIAGNOSIS — R93429 Abnormal radiologic findings on diagnostic imaging of unspecified kidney: Secondary | ICD-10-CM | POA: Diagnosis not present

## 2021-02-01 DIAGNOSIS — Z23 Encounter for immunization: Secondary | ICD-10-CM | POA: Diagnosis not present

## 2021-03-21 DIAGNOSIS — G629 Polyneuropathy, unspecified: Secondary | ICD-10-CM | POA: Diagnosis not present

## 2021-03-21 DIAGNOSIS — Z1231 Encounter for screening mammogram for malignant neoplasm of breast: Secondary | ICD-10-CM | POA: Diagnosis not present

## 2021-03-21 DIAGNOSIS — Z6833 Body mass index (BMI) 33.0-33.9, adult: Secondary | ICD-10-CM | POA: Diagnosis not present

## 2021-03-21 DIAGNOSIS — E118 Type 2 diabetes mellitus with unspecified complications: Secondary | ICD-10-CM | POA: Diagnosis not present

## 2021-03-21 DIAGNOSIS — E559 Vitamin D deficiency, unspecified: Secondary | ICD-10-CM | POA: Diagnosis not present

## 2021-03-21 DIAGNOSIS — E785 Hyperlipidemia, unspecified: Secondary | ICD-10-CM | POA: Diagnosis not present

## 2021-03-21 DIAGNOSIS — K219 Gastro-esophageal reflux disease without esophagitis: Secondary | ICD-10-CM | POA: Diagnosis not present

## 2021-03-21 DIAGNOSIS — I10 Essential (primary) hypertension: Secondary | ICD-10-CM | POA: Diagnosis not present

## 2021-03-21 DIAGNOSIS — E538 Deficiency of other specified B group vitamins: Secondary | ICD-10-CM | POA: Diagnosis not present

## 2021-04-05 ENCOUNTER — Other Ambulatory Visit: Payer: Self-pay | Admitting: Cardiology

## 2021-04-11 IMAGING — CR DG CHEST 2V
2 series · 2 of 2 positions shown · non-contrast
Comparison: 09/27/2019

CLINICAL DATA: Preoperative evaluation for hip replacement

EXAM:
CHEST - 2 VIEW

[w chest pa]
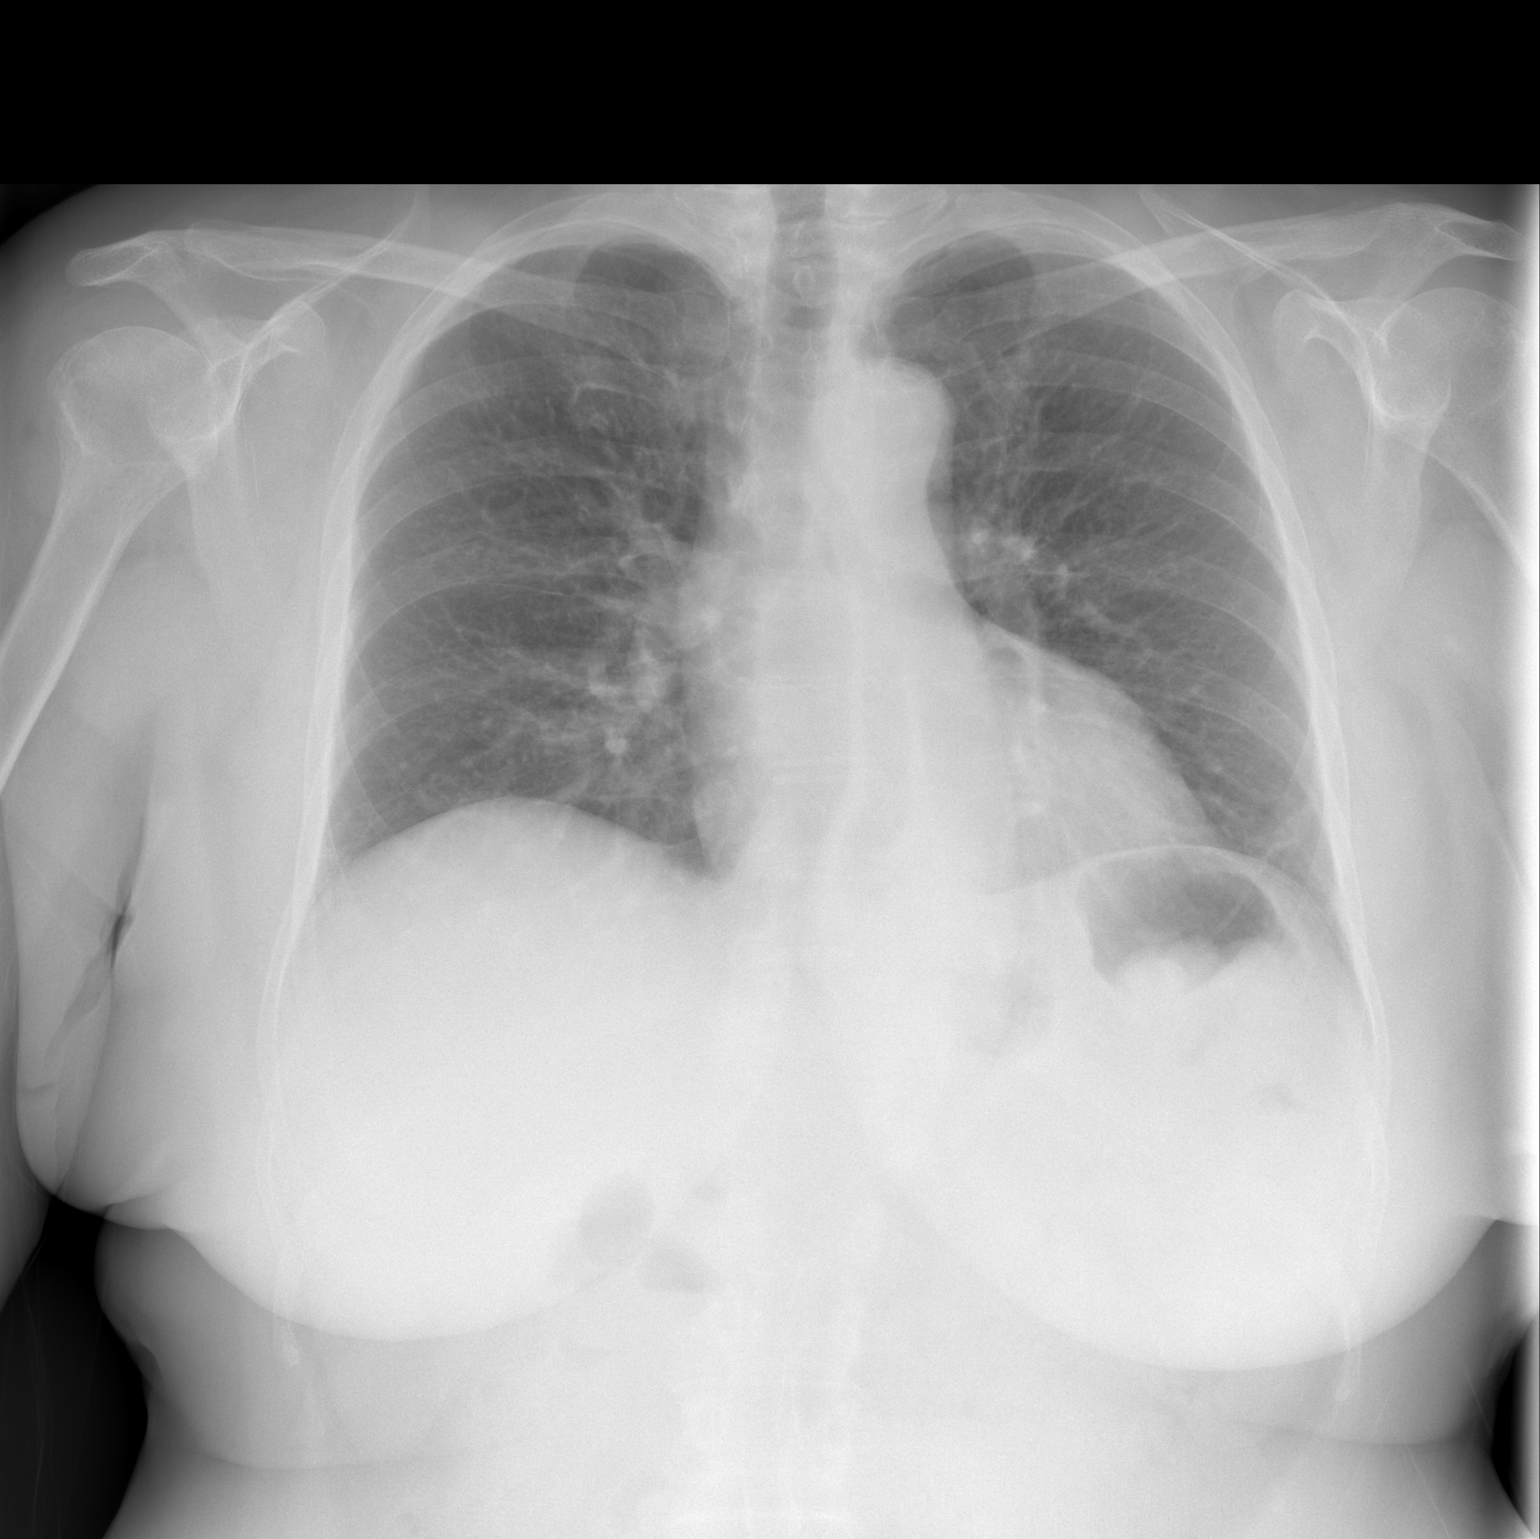

[w chest lat]
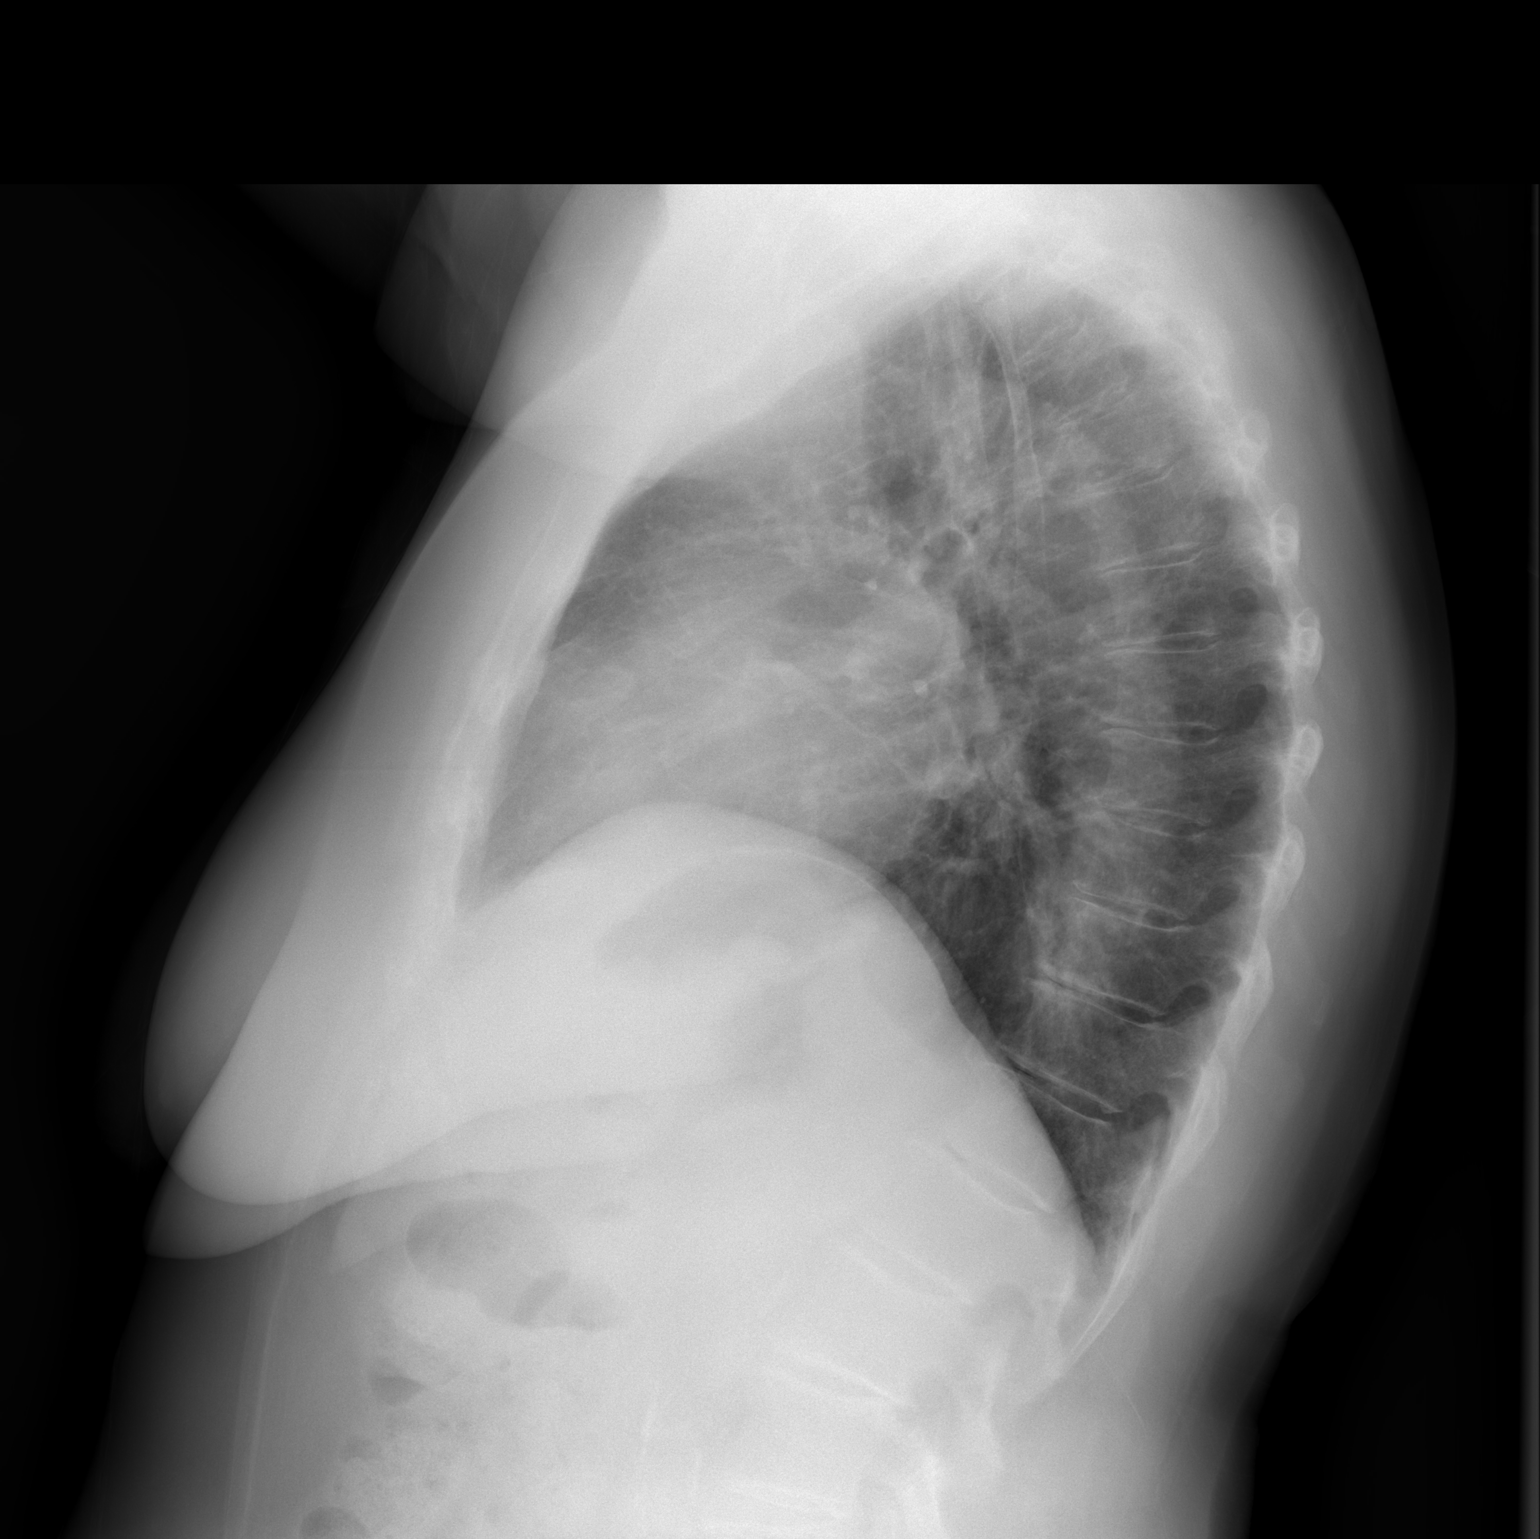

[2 of 2 positions shown; findings below may reference images not displayed]

FINDINGS: Normal heart size, mediastinal contours, and pulmonary vascularity.

Minimal LEFT basilar atelectasis versus scarring.

Lungs otherwise clear.

No infiltrate, pleural effusion, or pneumothorax.

Osseous structures unremarkable.
IMPRESSION: Minimal atelectasis versus scarring at LEFT base.

Otherwise negative exam.

## 2021-04-18 DIAGNOSIS — H52203 Unspecified astigmatism, bilateral: Secondary | ICD-10-CM | POA: Diagnosis not present

## 2021-04-18 DIAGNOSIS — H26493 Other secondary cataract, bilateral: Secondary | ICD-10-CM | POA: Diagnosis not present

## 2021-04-18 DIAGNOSIS — E119 Type 2 diabetes mellitus without complications: Secondary | ICD-10-CM | POA: Diagnosis not present

## 2021-04-18 DIAGNOSIS — H35371 Puckering of macula, right eye: Secondary | ICD-10-CM | POA: Diagnosis not present

## 2021-04-23 IMAGING — RF DG C-ARM 1-60 MIN-NO REPORT
1 series · 2 of 2 positions shown · non-contrast
Comparison: None.

CLINICAL DATA: Intraop right total hip replacement.

EXAM:
OPERATIVE RIGHT HIP (WITH PELVIS IF PERFORMED) 2 VIEWS
TECHNIQUE: Fluoroscopic spot image(s) were submitted for interpretation
post-operatively.

[Series 1: unknown protocol · 0.20mm/px · 2 of 2 slices shown]
[im 1/2]
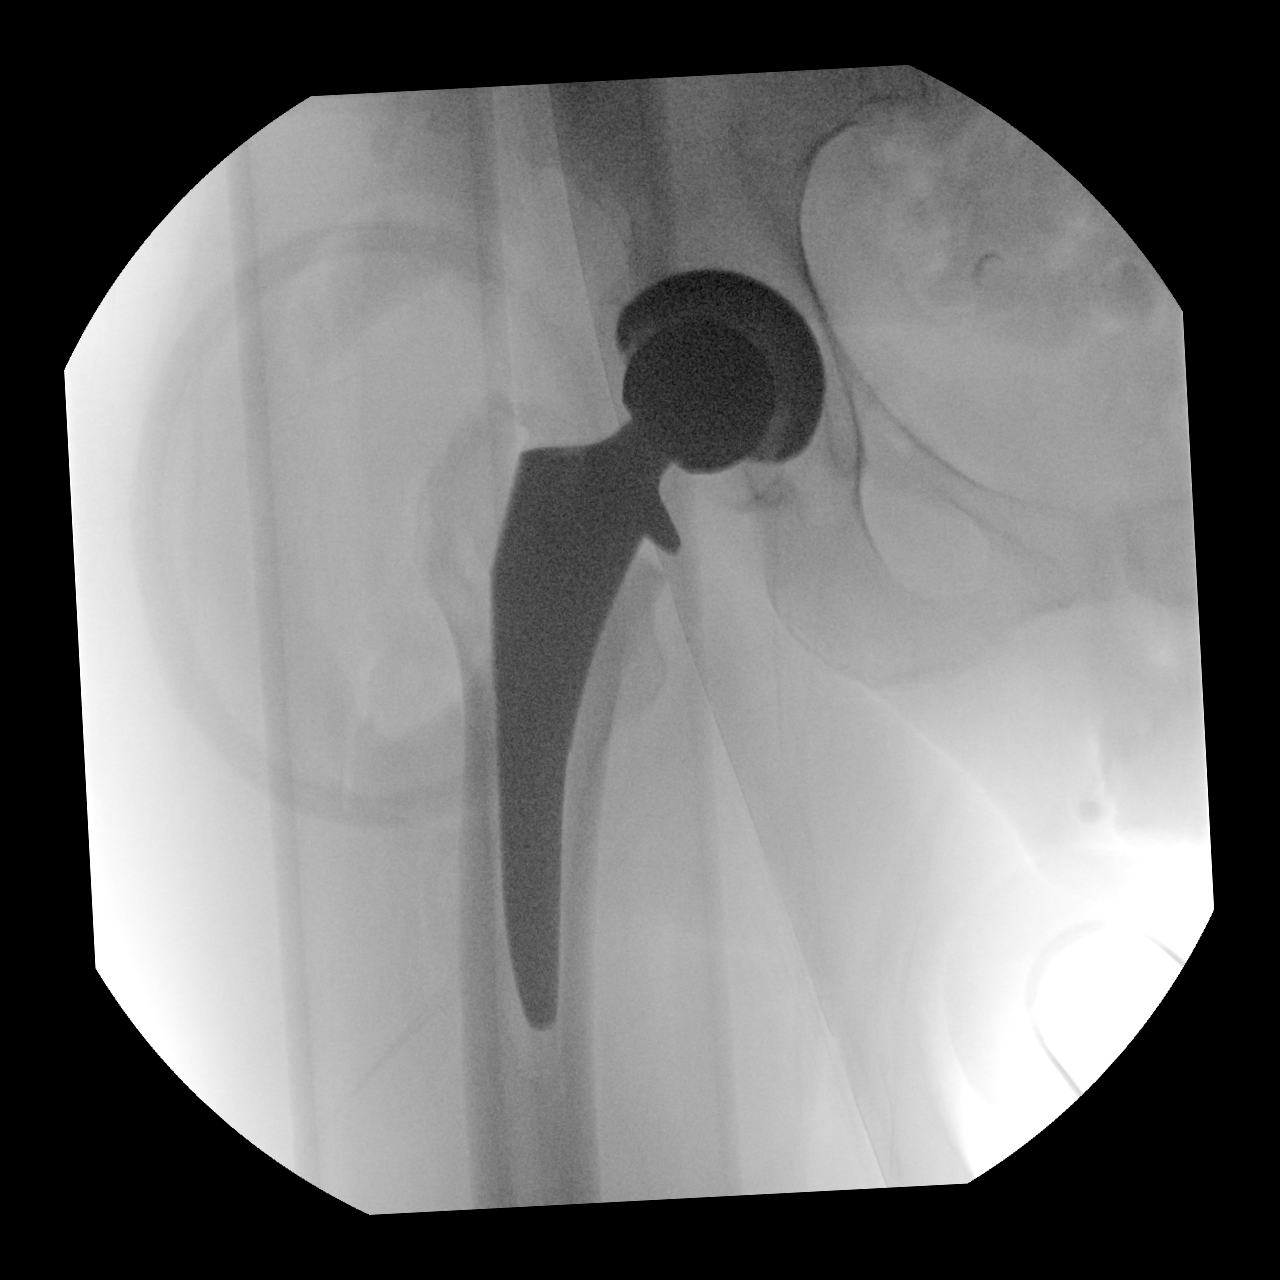
[im 2/2]
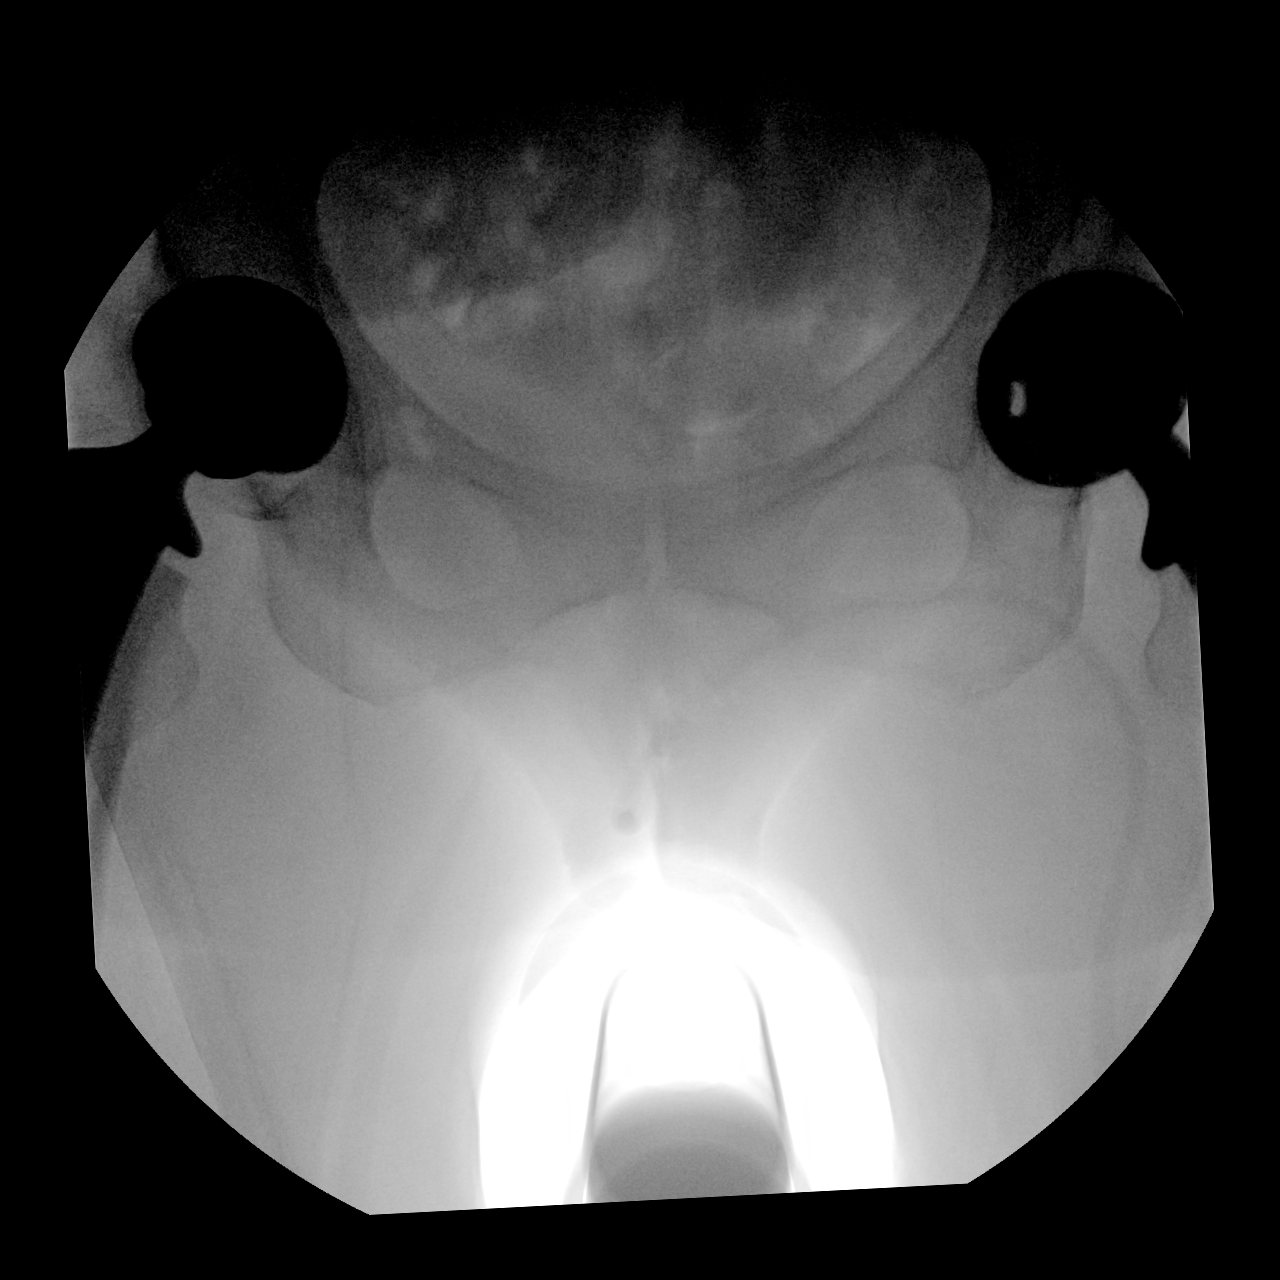

[2 of 2 positions shown; findings below may reference images not displayed]

FINDINGS: Fluoro time: 18 seconds.  Radiation: 2.27 mGy.

Two C-arm fluoroscopic images were obtained intraoperatively and
submitted for post operative interpretation. These images
demonstrate postsurgical changes of right total hip arthroplasty.
Partially imaged prior left total hip arthroplasty. Please see the
performing provider's procedural report for further detail.
IMPRESSION: Intraoperative fluoroscopic imaging, as detailed above.

## 2021-04-24 DIAGNOSIS — N814 Uterovaginal prolapse, unspecified: Secondary | ICD-10-CM | POA: Diagnosis not present

## 2021-04-24 DIAGNOSIS — R102 Pelvic and perineal pain: Secondary | ICD-10-CM | POA: Diagnosis not present

## 2021-05-01 DIAGNOSIS — Z1231 Encounter for screening mammogram for malignant neoplasm of breast: Secondary | ICD-10-CM | POA: Diagnosis not present

## 2021-05-02 DIAGNOSIS — N814 Uterovaginal prolapse, unspecified: Secondary | ICD-10-CM | POA: Diagnosis not present

## 2021-05-02 DIAGNOSIS — R102 Pelvic and perineal pain: Secondary | ICD-10-CM | POA: Diagnosis not present

## 2021-05-07 DIAGNOSIS — L209 Atopic dermatitis, unspecified: Secondary | ICD-10-CM | POA: Diagnosis not present

## 2021-06-26 DIAGNOSIS — Z1331 Encounter for screening for depression: Secondary | ICD-10-CM | POA: Diagnosis not present

## 2021-06-26 DIAGNOSIS — E118 Type 2 diabetes mellitus with unspecified complications: Secondary | ICD-10-CM | POA: Diagnosis not present

## 2021-06-26 DIAGNOSIS — Z9181 History of falling: Secondary | ICD-10-CM | POA: Diagnosis not present

## 2021-06-26 DIAGNOSIS — G629 Polyneuropathy, unspecified: Secondary | ICD-10-CM | POA: Diagnosis not present

## 2021-06-26 DIAGNOSIS — Z6834 Body mass index (BMI) 34.0-34.9, adult: Secondary | ICD-10-CM | POA: Diagnosis not present

## 2021-06-26 DIAGNOSIS — I10 Essential (primary) hypertension: Secondary | ICD-10-CM | POA: Diagnosis not present

## 2021-06-26 DIAGNOSIS — G5601 Carpal tunnel syndrome, right upper limb: Secondary | ICD-10-CM | POA: Diagnosis not present

## 2021-06-26 DIAGNOSIS — E785 Hyperlipidemia, unspecified: Secondary | ICD-10-CM | POA: Diagnosis not present

## 2021-06-26 DIAGNOSIS — K219 Gastro-esophageal reflux disease without esophagitis: Secondary | ICD-10-CM | POA: Diagnosis not present

## 2021-07-03 DIAGNOSIS — G5601 Carpal tunnel syndrome, right upper limb: Secondary | ICD-10-CM | POA: Diagnosis not present

## 2021-07-09 ENCOUNTER — Other Ambulatory Visit: Payer: Self-pay

## 2021-07-10 ENCOUNTER — Encounter: Payer: Self-pay | Admitting: Cardiology

## 2021-07-10 ENCOUNTER — Ambulatory Visit: Payer: Medicare PPO | Admitting: Cardiology

## 2021-07-10 VITALS — BP 126/70 | HR 76 | Ht 63.5 in | Wt 191.6 lb

## 2021-07-10 DIAGNOSIS — I25119 Atherosclerotic heart disease of native coronary artery with unspecified angina pectoris: Secondary | ICD-10-CM | POA: Diagnosis not present

## 2021-07-10 DIAGNOSIS — I10 Essential (primary) hypertension: Secondary | ICD-10-CM

## 2021-07-10 DIAGNOSIS — E782 Mixed hyperlipidemia: Secondary | ICD-10-CM

## 2021-07-10 DIAGNOSIS — G4733 Obstructive sleep apnea (adult) (pediatric): Secondary | ICD-10-CM | POA: Diagnosis not present

## 2021-07-10 MED ORDER — ROSUVASTATIN CALCIUM 40 MG PO TABS: 40.0000 mg | ORAL_TABLET | Freq: Every day | ORAL | 3 refills | Status: DC

## 2021-07-10 MED ORDER — NITROGLYCERIN 0.4 MG SL SUBL: 0.4000 mg | SUBLINGUAL_TABLET | SUBLINGUAL | 3 refills | Status: AC | PRN

## 2021-07-10 MED ORDER — ISOSORBIDE MONONITRATE ER 60 MG PO TB24: 60.0000 mg | ORAL_TABLET | Freq: Every day | ORAL | 3 refills | Status: DC

## 2021-07-10 NOTE — Patient Instructions (Signed)
Medication Instructions:  ?Your physician has recommended you make the following change in your medication:  ?Increase Imdur to 60 mg once daily ?Increase Crestor to 40 mg once daily ?Take 1 coated 81 mg aspirin once every day ? ?*If you need a refill on your cardiac medications before your next appointment, please call your pharmacy* ? ? ?Lab Work: ?NONE ?If you have labs (blood work) drawn today and your tests are completely normal, you will receive your results only by: ?MyChart Message (if you have MyChart) OR ?A paper copy in the mail ?If you have any lab test that is abnormal or we need to change your treatment, we will call you to review the results. ? ? ?Testing/Procedures: ?Your physician has requested that you have an echocardiogram. Echocardiography is a painless test that uses sound waves to create images of your heart. It provides your doctor with information about the size and shape of your heart and how well your heart?s chambers and valves are working. This procedure takes approximately one hour. There are no restrictions for this procedure.  ? ? ?Follow-Up: ?At [facility], you and your health needs are our priority.  As part of our continuing mission to provide you with exceptional heart care, we have created designated Provider Care Teams.  These Care Teams include your primary Cardiologist (physician) and Advanced Practice Providers (APPs -  Physician Assistants and Nurse Practitioners) who all work together to provide you with the care you need, when you need it. ? ?We recommend signing up for the patient portal called "MyChart".  Sign up information is provided on this After Visit Summary.  MyChart is used to connect with patients for Virtual Visits (Telemedicine).  Patients are able to view lab/test results, encounter notes, upcoming appointments, etc.  Non-urgent messages can be sent to your provider as well.   ?To learn more about what you can do with MyChart, go to  [email or web address].   ? ?Your next appointment:   ?2 month(s) ? ?The format for your next appointment:   ?In Person ? ?Provider:   ?Jenne Campus, MD  ? ? ?Other Instructions ? ? ?Important Information About Sugar ? ? ? ? ?  ?

## 2021-07-10 NOTE — Progress Notes (Signed)
?Cardiology Office Note:   ? ?Date:  07/10/2021  ? ?ID:  Karen Baker, DOB 1950/04/02, MRN [id number] ? ?PCP:  Earlyne Iba, NP  ?Cardiologist:  Jenne Campus, MD   ? ?Referring MD: Earlyne Iba, NP  ? ?Chief Complaint  ?Patient presents with  ? Follow-up  ?I have shortness of breath and chest pain ? ?History of Present Illness:   ? ?Karen Baker is a 71 y.o. female past medical history significant for severe obstructive sleep apnea follow-up by internal medicine team, on BiPAP, hyperlipidemia, essential hypertension, coronary artery disease.  In 2021 March she did have cardiac catheterization done which showed 35% stenosis of proximal mid LAD, second diagonal branch 70% stenosis, posterior descending artery at 35% stenosis ostial RCA 25 ejection fraction was normal.  She is being following up with's after 1 year of not being seen.  She is taking care of her husband who does have advanced Parkinson disease for about 10 years.  She is very stressed out about this.  She complained about having some shortness of breath with exertion which is worse than before also described to have some chest pain which happen when she walks a lot.  She said is getting slightly worse that she did not do before even when she got her cardiac catheterization done.  Otherwise seems to be doing fine ? ?Past Medical History:  ?Diagnosis Date  ? Anxiety   ? takes Xanax daily  ? Arthritis   ? Constipation   ? takes Colace daily  ? COVID-19   ? Diabetes mellitus without complication ([place])   ? Dyspnea   ? with exertion   ? GERD (gastroesophageal reflux disease)   ? takes Omeprazole daily  ? History of blood transfusion   ? no abnormal reaction noted  ? History of bronchitis 3-26yr ago  ? Hyperlipidemia   ? takes Lovastatin daily  ? Hypertension   ? takes Amlodipine daily  ? Joint pain   ? Joint swelling   ? 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 Pneumonia   ? hx of covid 03/2019 and then developed peumonia afterwards   ? Pre-diabetes   ? Primary  osteoarthritis of left hip 05/30/2014  ? Sleep apnea   ? bipap   ? Urinary frequency   ? Urinary urgency   ? ? ?Past Surgical History:  ?Procedure Laterality Date  ? ABDOMINAL HYSTERECTOMY  1972  ? APPENDECTOMY  1969  ? CATARACT EXTRACTION W/ INTRAOCULAR LENS IMPLANT Bilateral   ? CHOLECYSTECTOMY    ? COLONOSCOPY    ? cyst removed from ovary    ? knee athroscopy Right   ? knots removed from neck    ? LEFT HEART CATH AND CORONARY ANGIOGRAPHY N/A 06/27/2019  ? Procedure: LEFT HEART CATH AND CORONARY ANGIOGRAPHY;  Surgeon: JMartinique Peter M, MD;  [facility];  Service: Cardiovascular;  Laterality: N/A;  ? PARATHYROIDECTOMY Right 09/03/2020  ? Procedure: RIGHT INFERIOR PARATHYROIDECTOMY;  Surgeon: GArmandina Gemma MD;  Location: [facility];  Service: General;  Laterality: Right;  ? TONSILLECTOMY  1956  ? TOTAL HIP ARTHROPLASTY Left 05/30/2014  ? Procedure: LEFT TOTAL HIP ARTHROPLASTY ANTERIOR APPROACH;  Surgeon: PHessie Dibble MD;  [facility]  Service: Orthopedics;  Laterality: Left;  ? TOTAL HIP ARTHROPLASTY Right 04/03/2020  ? Procedure: RIGHT TOTAL HIP ARTHROPLASTY ANTERIOR APPROACH;  Surgeon: DMelrose Nakayama MD;  Location: [facility];  Service: Orthopedics;  Laterality: Right;  ? tumors removed from arches in feet Bilateral   ? ? ?  Current Medications: ?Current Meds  ?Medication Sig  ? alendronate (FOSAMAX) 70 MG tablet Take 70 mg by mouth every Sunday. Take with a full glass of water on an empty stomach.  ? Cholecalciferol (VITAMIN D3) 50 MCG (2000 UT) TABS Take 2,000 Units by mouth in the morning.  ? ferrous sulfate 325 (65 FE) MG tablet Take 325 mg by mouth in the morning.  ? isosorbide mononitrate (IMDUR) 30 MG 24 hr tablet TAKE 1 TABLET BY MOUTH EVERY DAY (Patient taking differently: Take 30 mg by mouth daily.)  ? metFORMIN (GLUCOPHAGE-XR) 500 MG 24 hr tablet Take 500 mg by mouth in the morning.  ? nitroGLYCERIN (NITROSTAT) 0.4 MG SL tablet Place 0.4 mg under the tongue every 5 (five) minutes x 3 doses as  needed for chest pain.  ? pantoprazole (PROTONIX) 40 MG tablet Take 40 mg by mouth in the morning.  ? rosuvastatin (CRESTOR) 20 MG tablet Take 20 mg by mouth in the morning.  ? vitamin C (ASCORBIC ACID) 500 MG tablet Take 500 mg by mouth in the morning.  ? [DISCONTINUED] traMADol (ULTRAM) 50 MG tablet Take 1-2 tablets (50-100 mg total) by mouth every 6 (six) hours as needed. (Patient taking differently: Take 50-100 mg by mouth every 6 (six) hours as needed for moderate pain or severe pain.)  ?  ? ?Allergies:   Azithromycin and Tape  ? ?Social History  ? ?Socioeconomic History  ? Marital status: Married  ?  Spouse name: Not on file  ? Number of children: Not on file  ? Years of education: Not on file  ? Highest education level: Not on file  ?Occupational History  ? Not on file  ?Tobacco Use  ? Smoking status: Former  ?  Packs/day: 1.00  ?  Years: 20.00  ?  Pack years: 20.00  ?  Types: Cigarettes  ?  Quit date: 02/17/2004  ?  Years since quitting: 17.4  ? Smokeless tobacco: Never  ? Tobacco comments:  ?  no cigs in over a week  ?Vaping Use  ? Vaping Use: Never used  ?Substance and Sexual Activity  ? Alcohol use: No  ? Drug use: No  ? Sexual activity: Yes  ?  Birth control/protection: Surgical  ?Other Topics Concern  ? Not on file  ?Social History Narrative  ? Not on file  ? ?Social Determinants of Health  ? ?Financial Resource Strain: Not on file  ?Food Insecurity: Not on file  ?Transportation Needs: Not on file  ?Physical Activity: Not on file  ?Stress: Not on file  ?Social Connections: Not on file  ?  ? ?Family History: ?The patient's family history includes Cancer in her sister; Hypertension in her brother and mother; Stroke in her mother. ?ROS:   ?Please see the history of present illness.    ?All 14 point review of systems negative except as described per history of present illness ? ?EKGs/Labs/Other Studies Reviewed:   ? ? ? ?Recent Labs: ?08/24/2020: BUN 17; Creatinine, Ser 1.07; Hemoglobin 11.8; Platelets 225;  Potassium 4.4; Sodium 138  ?Recent Lipid Panel ?   ?Component Value Date/Time  ? CHOL 96 06/25/2019 0037  ? TRIG 112 06/25/2019 0037  ? HDL 32 (L) 06/25/2019 0037  ? CHOLHDL 3.0 06/25/2019 0037  ? VLDL 22 06/25/2019 0037  ? [place] 42 06/25/2019 0037  ? ? ?Physical Exam:   ? ?VS:  BP 126/70 (BP Location: Right Arm, Patient Position: Sitting)   Pulse 76   Ht 5' 3.5" (1.613  m)   Wt 191 lb 9.6 oz (86.9 kg)   SpO2 97%   BMI 33.41 kg/m?    ? ?Wt Readings from Last 3 Encounters:  ?07/10/21 191 lb 9.6 oz (86.9 kg)  ?09/03/20 184 lb 15.5 oz (83.9 kg)  ?08/24/20 185 lb (83.9 kg)  ?  ? ?GEN:  Well nourished, well developed in no acute distress ?HEENT: Normal ?NECK: No JVD; No carotid bruits ?LYMPHATICS: No lymphadenopathy ?CARDIAC: RRR, no murmurs, no rubs, no gallops ?RESPIRATORY:  Clear to auscultation without rales, wheezing or rhonchi  ?ABDOMEN: Soft, non-tender, non-distended ?MUSCULOSKELETAL:  No edema; No deformity  ?SKIN: Warm and dry ?LOWER EXTREMITIES: no swelling ?NEUROLOGIC:  Alert and oriented x 3 ?PSYCHIATRIC:  Normal affect  ? ?ASSESSMENT:   ? ?1. Coronary artery disease involving native coronary artery of native heart with angina pectoris ([place])   ?2. Essential hypertension   ?3. Obstructive sleep apnea syndrome   ?4. Mixed hyperlipidemia   ? ?PLAN:   ? ?In order of problems listed above: ? ?Coronary disease with multiple nonobstructive lesions based on cardiac cath from 2 years ago.  She is not on aspirin and she need to be on so I asked her to start taking antiplatelet therapy, I will increase dose of the Imdur from 30 to 60 mg daily, she is also suboptimally managed for her dyslipidemia and asked her to increase Crestor from 20 to 40 mg daily review her K PN show me her LDL of 106 HDL 44 which is clearly not sufficient for somebody with coronary artery disease. ?Essential hypertension: Blood pressure seems to be well controlled continue present management. ?Obstructive sleep apnea that being followed by  internal medicine team. ?Mixed dyslipidemia: Plan as described above.  We will increase Crestor from 20-40 ? ? ?Medication Adjustments/Labs and Tests Ordered: ?Current medicines are reviewed at length with th

## 2021-07-18 ENCOUNTER — Ambulatory Visit (INDEPENDENT_AMBULATORY_CARE_PROVIDER_SITE_OTHER): Payer: Medicare PPO

## 2021-07-18 DIAGNOSIS — E782 Mixed hyperlipidemia: Secondary | ICD-10-CM

## 2021-07-18 DIAGNOSIS — I10 Essential (primary) hypertension: Secondary | ICD-10-CM | POA: Diagnosis not present

## 2021-07-18 DIAGNOSIS — I25119 Atherosclerotic heart disease of native coronary artery with unspecified angina pectoris: Secondary | ICD-10-CM | POA: Diagnosis not present

## 2021-07-18 DIAGNOSIS — G4733 Obstructive sleep apnea (adult) (pediatric): Secondary | ICD-10-CM

## 2021-07-18 LAB — ECHOCARDIOGRAM COMPLETE
Area-P 1/2: 3.6 cm2
S' Lateral: 3.1 cm

## 2021-09-12 ENCOUNTER — Ambulatory Visit: Payer: Medicare PPO | Admitting: Cardiology

## 2021-09-26 DIAGNOSIS — E118 Type 2 diabetes mellitus with unspecified complications: Secondary | ICD-10-CM | POA: Diagnosis not present

## 2021-09-26 DIAGNOSIS — I700 Atherosclerosis of aorta: Secondary | ICD-10-CM | POA: Diagnosis not present

## 2021-09-26 DIAGNOSIS — E559 Vitamin D deficiency, unspecified: Secondary | ICD-10-CM | POA: Diagnosis not present

## 2021-09-26 DIAGNOSIS — G629 Polyneuropathy, unspecified: Secondary | ICD-10-CM | POA: Diagnosis not present

## 2021-09-26 DIAGNOSIS — I10 Essential (primary) hypertension: Secondary | ICD-10-CM | POA: Diagnosis not present

## 2021-09-26 DIAGNOSIS — Z6833 Body mass index (BMI) 33.0-33.9, adult: Secondary | ICD-10-CM | POA: Diagnosis not present

## 2021-09-26 DIAGNOSIS — E538 Deficiency of other specified B group vitamins: Secondary | ICD-10-CM | POA: Diagnosis not present

## 2021-09-26 DIAGNOSIS — Z139 Encounter for screening, unspecified: Secondary | ICD-10-CM | POA: Diagnosis not present

## 2021-09-26 DIAGNOSIS — E785 Hyperlipidemia, unspecified: Secondary | ICD-10-CM | POA: Diagnosis not present

## 2021-10-22 DIAGNOSIS — F330 Major depressive disorder, recurrent, mild: Secondary | ICD-10-CM | POA: Diagnosis not present

## 2021-10-22 DIAGNOSIS — Z6833 Body mass index (BMI) 33.0-33.9, adult: Secondary | ICD-10-CM | POA: Diagnosis not present

## 2021-12-12 ENCOUNTER — Other Ambulatory Visit: Payer: Self-pay | Admitting: Cardiology

## 2021-12-12 DIAGNOSIS — E785 Hyperlipidemia, unspecified: Secondary | ICD-10-CM

## 2021-12-31 ENCOUNTER — Ambulatory Visit: Payer: Medicare PPO | Attending: Cardiology | Admitting: Cardiology

## 2021-12-31 ENCOUNTER — Encounter: Payer: Self-pay | Admitting: Cardiology

## 2021-12-31 VITALS — BP 126/78 | HR 70 | Ht 65.0 in | Wt 193.0 lb

## 2021-12-31 DIAGNOSIS — E118 Type 2 diabetes mellitus with unspecified complications: Secondary | ICD-10-CM

## 2021-12-31 DIAGNOSIS — I25119 Atherosclerotic heart disease of native coronary artery with unspecified angina pectoris: Secondary | ICD-10-CM | POA: Diagnosis not present

## 2021-12-31 DIAGNOSIS — G4733 Obstructive sleep apnea (adult) (pediatric): Secondary | ICD-10-CM

## 2021-12-31 DIAGNOSIS — I10 Essential (primary) hypertension: Secondary | ICD-10-CM

## 2021-12-31 DIAGNOSIS — E782 Mixed hyperlipidemia: Secondary | ICD-10-CM | POA: Diagnosis not present

## 2021-12-31 NOTE — Patient Instructions (Signed)

## 2021-12-31 NOTE — Progress Notes (Signed)
Cardiology Office Note:    Date:  12/31/2021   ID:  Karen Baker, DOB 12/08/1950, MRN [id number]  PCP:  Earlyne Iba, NP  Cardiologist:  Jenne Campus, MD    Referring MD: Earlyne Iba, NP   Chief Complaint  Patient presents with   Follow-up  Doing well  History of Present Illness:    Karen Baker is a 71 y.o. female  past medical history significant for severe obstructive sleep apnea follow-up by internal medicine team, on BiPAP, hyperlipidemia, essential hypertension, coronary artery disease.  In 2021 March she did have cardiac catheterization done which showed 35% stenosis of proximal mid LAD, second diagonal branch 70% stenosis, posterior descending artery at 35% stenosis ostial RCA 25 ejection fraction was normal.  Comes today to my office for ollow operable doing well.  She is taking care of her husband who does have Parkinson disease for more than 10 years.  Denies have any chest pain tightness squeezing pressure burning chest.  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   Pneumonia    hx of covid 03/2019 and then developed peumonia afterwards    Pre-diabetes    Primary osteoarthritis of left hip 05/30/2014   Sleep apnea    bipap    Urinary frequency    Urinary urgency     Past Surgical History:  Procedure Laterality Date   ABDOMINAL HYSTERECTOMY  1972   APPENDECTOMY  1969   CATARACT EXTRACTION W/ INTRAOCULAR LENS IMPLANT Bilateral    CHOLECYSTECTOMY     COLONOSCOPY     cyst removed  from ovary     knee athroscopy Right    knots removed from neck     LEFT HEART CATH AND CORONARY ANGIOGRAPHY N/A 06/27/2019   Procedure: LEFT HEART CATH AND CORONARY ANGIOGRAPHY;  Surgeon: JMartinique Peter M, MD;  [facility];  Service: Cardiovascular;  Laterality: N/A;   PARATHYROIDECTOMY Right 09/03/2020   Procedure: RIGHT INFERIOR PARATHYROIDECTOMY;  Surgeon: GArmandina Gemma MD;  Location: [facility];  Service: General;  Laterality: Right;   TONSILLECTOMY  1956   TOTAL HIP ARTHROPLASTY Left 05/30/2014   Procedure: LEFT TOTAL HIP ARTHROPLASTY ANTERIOR APPROACH;  Surgeon: PHessie Dibble MD;  [facility]  Service: Orthopedics;  Laterality: Left;   TOTAL HIP ARTHROPLASTY Right 04/03/2020   Procedure: RIGHT TOTAL HIP ARTHROPLASTY ANTERIOR APPROACH;  Surgeon: DMelrose Nakayama MD;  Location: [facility];  Service: Orthopedics;  Laterality: Right;   tumors removed from arches in feet Bilateral     Current Medications: Current Meds  Medication Sig   alendronate (FOSAMAX) 70 MG tablet Take 70 mg by mouth every Sunday. Take with a full glass of water on an empty stomach.   Cholecalciferol (VITAMIN D3) 50 MCG (2000 UT) TABS Take 2,000 Units by mouth in the morning.   escitalopram (LEXAPRO) 10 MG tablet Take 10 mg by  mouth daily.   ferrous sulfate 325 (65 FE) MG tablet Take 325 mg by mouth in the morning.   isosorbide mononitrate (IMDUR) 60 MG 24 hr tablet Take 1 tablet (60 mg total) by mouth daily.   metFORMIN (GLUCOPHAGE-XR) 500 MG 24 hr tablet Take 500 mg by mouth in the morning.   nitroGLYCERIN (NITROSTAT) 0.4 MG SL tablet Place 1 tablet (0.4 mg total) under the tongue every 5 (five) minutes x 3 doses as needed for chest pain.   pantoprazole (PROTONIX) 40 MG tablet Take 40 mg by mouth in the morning.   rosuvastatin (CRESTOR) 40 MG tablet Take 1 tablet (40 mg total) by mouth daily.   vitamin C (ASCORBIC ACID) 500 MG tablet Take 500 mg by mouth in the morning.     Allergies:   Azithromycin  and Tape   Social History   Socioeconomic History   Marital status: Married    Spouse name: Not on file   Number of children: Not on file   Years of education: Not on file   Highest education level: Not on file  Occupational History   Not on file  Tobacco Use   Smoking status: Former    Packs/day: 1.00    Years: 20.00    Total pack years: 20.00    Types: Cigarettes    Quit date: 02/17/2004    Years since quitting: 17.8   Smokeless tobacco: Never   Tobacco comments:    no cigs in over a week  Vaping Use   Vaping Use: Never used  Substance and Sexual Activity   Alcohol use: No   Drug use: No   Sexual activity: Yes    Birth control/protection: Surgical  Other Topics Concern   Not on file  Social History Narrative   Not on file   Social Determinants of Health   Financial Resource Strain: Not on file  Food Insecurity: Not on file  Transportation Needs: Not on file  Physical Activity: Not on file  Stress: Not on file  Social Connections: Not on file     Family History: The patient's family history includes Cancer in her sister; Hypertension in her brother and mother; Stroke in her mother. ROS:   Please see the history of present illness.    All 14 point review of systems negative except as described per history of present illness  EKGs/Labs/Other Studies Reviewed:      Recent Labs: No results found for requested labs within last 365 days.  Recent Lipid Panel    Component Value Date/Time   CHOL 96 06/25/2019 0037   TRIG 112 06/25/2019 0037   HDL 32 (L) 06/25/2019 0037   CHOLHDL 3.0 06/25/2019 0037   VLDL 22 06/25/2019 0037   LDLCALC 42 06/25/2019 0037    Physical Exam:    VS:  BP 126/78 (BP Location: Left Arm, Patient Position: Sitting)   Pulse 70   Ht '5\' 5"'$  (1.651 m)   Wt 193 lb (87.5 kg)   SpO2 98%   BMI 32.12 kg/m     Wt Readings from Last 3 Encounters:  12/31/21 193 lb (87.5 kg)  07/10/21 191 lb 9.6 oz (86.9 kg)  09/03/20 184 lb 15.5 oz  (83.9 kg)     GEN:  Well nourished, well developed in no acute distress HEENT: Normal NECK: No JVD; No carotid bruits LYMPHATICS: No lymphadenopathy CARDIAC: RRR, no murmurs, no rubs, no gallops RESPIRATORY:  Clear to auscultation without rales, wheezing or rhonchi  ABDOMEN: Soft, non-tender,  non-distended MUSCULOSKELETAL:  No edema; No deformity  SKIN: Warm and dry LOWER EXTREMITIES: no swelling NEUROLOGIC:  Alert and oriented x 3 PSYCHIATRIC:  Normal affect   ASSESSMENT:    1. Coronary artery disease involving native coronary artery of native heart with angina pectoris ([place])   2. Essential hypertension   3. Obstructive sleep apnea syndrome, severe   4. Type 2 diabetes mellitus with complication, without long-term current use of insulin ([place])   5. Mixed hyperlipidemia    PLAN:    In order of problems listed above:  Coronary disease stable from that point review denies have any signs and symptoms that would suggest reactivation of the problem.  We will continue antiplatelets therapy in form of aspirin. Dyslipidemia I did review her K PN which show me her LDL of 47 HDL 44 he is on Crestor 40 which I will continue. Essential hypertension: Blood pressure well controlled continue present management Diabetes.  Hemoglobin A1c 6.1 this is from summer of this year.  She is on Glucophage followed by internal medicine team.   Medication Adjustments/Labs and Tests Ordered: Current medicines are reviewed at length with the patient today.  Concerns regarding medicines are outlined above.  No orders of the defined types were placed in this encounter.  Medication changes: No orders of the defined types were placed in this encounter.   Signed, Park Liter, MD, [facility] 12/31/2021 3:44 PM    [place]

## 2022-01-23 DIAGNOSIS — E118 Type 2 diabetes mellitus with unspecified complications: Secondary | ICD-10-CM | POA: Diagnosis not present

## 2022-01-23 DIAGNOSIS — I10 Essential (primary) hypertension: Secondary | ICD-10-CM | POA: Diagnosis not present

## 2022-01-23 DIAGNOSIS — E559 Vitamin D deficiency, unspecified: Secondary | ICD-10-CM | POA: Diagnosis not present

## 2022-01-23 DIAGNOSIS — Z6833 Body mass index (BMI) 33.0-33.9, adult: Secondary | ICD-10-CM | POA: Diagnosis not present

## 2022-01-23 DIAGNOSIS — K219 Gastro-esophageal reflux disease without esophagitis: Secondary | ICD-10-CM | POA: Diagnosis not present

## 2022-01-23 DIAGNOSIS — G629 Polyneuropathy, unspecified: Secondary | ICD-10-CM | POA: Diagnosis not present

## 2022-01-23 DIAGNOSIS — E785 Hyperlipidemia, unspecified: Secondary | ICD-10-CM | POA: Diagnosis not present

## 2022-04-28 DIAGNOSIS — E119 Type 2 diabetes mellitus without complications: Secondary | ICD-10-CM | POA: Diagnosis not present

## 2022-04-28 DIAGNOSIS — Z961 Presence of intraocular lens: Secondary | ICD-10-CM | POA: Diagnosis not present

## 2022-04-28 DIAGNOSIS — H26493 Other secondary cataract, bilateral: Secondary | ICD-10-CM | POA: Diagnosis not present

## 2022-04-28 DIAGNOSIS — H524 Presbyopia: Secondary | ICD-10-CM | POA: Diagnosis not present

## 2022-04-30 DIAGNOSIS — E118 Type 2 diabetes mellitus with unspecified complications: Secondary | ICD-10-CM | POA: Diagnosis not present

## 2022-04-30 DIAGNOSIS — M810 Age-related osteoporosis without current pathological fracture: Secondary | ICD-10-CM | POA: Diagnosis not present

## 2022-04-30 DIAGNOSIS — E538 Deficiency of other specified B group vitamins: Secondary | ICD-10-CM | POA: Diagnosis not present

## 2022-04-30 DIAGNOSIS — Z1231 Encounter for screening mammogram for malignant neoplasm of breast: Secondary | ICD-10-CM | POA: Diagnosis not present

## 2022-04-30 DIAGNOSIS — E559 Vitamin D deficiency, unspecified: Secondary | ICD-10-CM | POA: Diagnosis not present

## 2022-04-30 DIAGNOSIS — K219 Gastro-esophageal reflux disease without esophagitis: Secondary | ICD-10-CM | POA: Diagnosis not present

## 2022-04-30 DIAGNOSIS — E785 Hyperlipidemia, unspecified: Secondary | ICD-10-CM | POA: Diagnosis not present

## 2022-04-30 DIAGNOSIS — Z6833 Body mass index (BMI) 33.0-33.9, adult: Secondary | ICD-10-CM | POA: Diagnosis not present

## 2022-04-30 DIAGNOSIS — G629 Polyneuropathy, unspecified: Secondary | ICD-10-CM | POA: Diagnosis not present

## 2022-04-30 DIAGNOSIS — I10 Essential (primary) hypertension: Secondary | ICD-10-CM | POA: Diagnosis not present

## 2022-05-15 DIAGNOSIS — M810 Age-related osteoporosis without current pathological fracture: Secondary | ICD-10-CM | POA: Diagnosis not present

## 2022-05-15 DIAGNOSIS — Z1231 Encounter for screening mammogram for malignant neoplasm of breast: Secondary | ICD-10-CM | POA: Diagnosis not present

## 2022-06-14 ENCOUNTER — Other Ambulatory Visit: Payer: Self-pay | Admitting: Cardiology

## 2022-06-16 NOTE — Telephone Encounter (Signed)
Rx refill sent to pharmacy. 

## 2022-08-04 DIAGNOSIS — D509 Iron deficiency anemia, unspecified: Secondary | ICD-10-CM | POA: Diagnosis not present

## 2022-08-04 DIAGNOSIS — Z9181 History of falling: Secondary | ICD-10-CM | POA: Diagnosis not present

## 2022-08-04 DIAGNOSIS — E538 Deficiency of other specified B group vitamins: Secondary | ICD-10-CM | POA: Diagnosis not present

## 2022-08-04 DIAGNOSIS — M7989 Other specified soft tissue disorders: Secondary | ICD-10-CM | POA: Diagnosis not present

## 2022-08-04 DIAGNOSIS — E118 Type 2 diabetes mellitus with unspecified complications: Secondary | ICD-10-CM | POA: Diagnosis not present

## 2022-08-04 DIAGNOSIS — S99921A Unspecified injury of right foot, initial encounter: Secondary | ICD-10-CM | POA: Diagnosis not present

## 2022-08-04 DIAGNOSIS — Z6833 Body mass index (BMI) 33.0-33.9, adult: Secondary | ICD-10-CM | POA: Diagnosis not present

## 2022-08-04 DIAGNOSIS — M79671 Pain in right foot: Secondary | ICD-10-CM | POA: Diagnosis not present

## 2022-08-04 DIAGNOSIS — E559 Vitamin D deficiency, unspecified: Secondary | ICD-10-CM | POA: Diagnosis not present

## 2022-08-04 DIAGNOSIS — G629 Polyneuropathy, unspecified: Secondary | ICD-10-CM | POA: Diagnosis not present

## 2022-08-04 DIAGNOSIS — K219 Gastro-esophageal reflux disease without esophagitis: Secondary | ICD-10-CM | POA: Diagnosis not present

## 2022-08-04 DIAGNOSIS — I10 Essential (primary) hypertension: Secondary | ICD-10-CM | POA: Diagnosis not present

## 2022-08-04 DIAGNOSIS — E785 Hyperlipidemia, unspecified: Secondary | ICD-10-CM | POA: Diagnosis not present

## 2022-08-06 DIAGNOSIS — M79671 Pain in right foot: Secondary | ICD-10-CM | POA: Diagnosis not present

## 2022-09-23 DIAGNOSIS — Z6833 Body mass index (BMI) 33.0-33.9, adult: Secondary | ICD-10-CM | POA: Diagnosis not present

## 2022-09-23 DIAGNOSIS — R059 Cough, unspecified: Secondary | ICD-10-CM | POA: Diagnosis not present

## 2022-09-23 DIAGNOSIS — J329 Chronic sinusitis, unspecified: Secondary | ICD-10-CM | POA: Diagnosis not present

## 2022-10-08 DIAGNOSIS — J029 Acute pharyngitis, unspecified: Secondary | ICD-10-CM | POA: Diagnosis not present

## 2022-10-08 DIAGNOSIS — R059 Cough, unspecified: Secondary | ICD-10-CM | POA: Diagnosis not present

## 2022-10-08 DIAGNOSIS — Z6832 Body mass index (BMI) 32.0-32.9, adult: Secondary | ICD-10-CM | POA: Diagnosis not present

## 2022-10-08 DIAGNOSIS — H6692 Otitis media, unspecified, left ear: Secondary | ICD-10-CM | POA: Diagnosis not present

## 2022-10-08 DIAGNOSIS — H6092 Unspecified otitis externa, left ear: Secondary | ICD-10-CM | POA: Diagnosis not present

## 2022-11-24 DIAGNOSIS — E785 Hyperlipidemia, unspecified: Secondary | ICD-10-CM | POA: Diagnosis not present

## 2022-11-24 DIAGNOSIS — K219 Gastro-esophageal reflux disease without esophagitis: Secondary | ICD-10-CM | POA: Diagnosis not present

## 2022-11-24 DIAGNOSIS — E559 Vitamin D deficiency, unspecified: Secondary | ICD-10-CM | POA: Diagnosis not present

## 2022-11-24 DIAGNOSIS — E118 Type 2 diabetes mellitus with unspecified complications: Secondary | ICD-10-CM | POA: Diagnosis not present

## 2022-11-24 DIAGNOSIS — D509 Iron deficiency anemia, unspecified: Secondary | ICD-10-CM | POA: Diagnosis not present

## 2022-11-24 DIAGNOSIS — I10 Essential (primary) hypertension: Secondary | ICD-10-CM | POA: Diagnosis not present

## 2022-11-24 DIAGNOSIS — E538 Deficiency of other specified B group vitamins: Secondary | ICD-10-CM | POA: Diagnosis not present

## 2022-11-24 DIAGNOSIS — G629 Polyneuropathy, unspecified: Secondary | ICD-10-CM | POA: Diagnosis not present

## 2022-12-06 DIAGNOSIS — Z7982 Long term (current) use of aspirin: Secondary | ICD-10-CM | POA: Diagnosis not present

## 2022-12-06 DIAGNOSIS — K91840 Postprocedural hemorrhage and hematoma of a digestive system organ or structure following a digestive system procedure: Secondary | ICD-10-CM | POA: Diagnosis not present

## 2022-12-06 DIAGNOSIS — K068 Other specified disorders of gingiva and edentulous alveolar ridge: Secondary | ICD-10-CM | POA: Diagnosis not present

## 2023-02-04 ENCOUNTER — Other Ambulatory Visit: Payer: Self-pay | Admitting: Cardiology

## 2023-02-25 DIAGNOSIS — M2550 Pain in unspecified joint: Secondary | ICD-10-CM | POA: Diagnosis not present

## 2023-02-25 DIAGNOSIS — F419 Anxiety disorder, unspecified: Secondary | ICD-10-CM | POA: Diagnosis not present

## 2023-02-25 DIAGNOSIS — E118 Type 2 diabetes mellitus with unspecified complications: Secondary | ICD-10-CM | POA: Diagnosis not present

## 2023-02-25 DIAGNOSIS — E785 Hyperlipidemia, unspecified: Secondary | ICD-10-CM | POA: Diagnosis not present

## 2023-02-25 DIAGNOSIS — I10 Essential (primary) hypertension: Secondary | ICD-10-CM | POA: Diagnosis not present

## 2023-02-25 DIAGNOSIS — Z23 Encounter for immunization: Secondary | ICD-10-CM | POA: Diagnosis not present

## 2023-02-25 DIAGNOSIS — Z139 Encounter for screening, unspecified: Secondary | ICD-10-CM | POA: Diagnosis not present

## 2023-02-25 DIAGNOSIS — Z6832 Body mass index (BMI) 32.0-32.9, adult: Secondary | ICD-10-CM | POA: Diagnosis not present

## 2023-02-25 DIAGNOSIS — K219 Gastro-esophageal reflux disease without esophagitis: Secondary | ICD-10-CM | POA: Diagnosis not present

## 2023-03-29 ENCOUNTER — Other Ambulatory Visit: Payer: Self-pay | Admitting: Cardiology

## 2023-04-07 ENCOUNTER — Other Ambulatory Visit: Payer: Self-pay | Admitting: Cardiology

## 2023-04-11 ENCOUNTER — Other Ambulatory Visit: Payer: Self-pay | Admitting: Cardiology

## 2023-04-18 ENCOUNTER — Other Ambulatory Visit: Payer: Self-pay | Admitting: Cardiology

## 2023-04-24 ENCOUNTER — Other Ambulatory Visit: Payer: Self-pay | Admitting: Cardiology

## 2023-04-30 DIAGNOSIS — H26493 Other secondary cataract, bilateral: Secondary | ICD-10-CM | POA: Diagnosis not present

## 2023-04-30 DIAGNOSIS — E119 Type 2 diabetes mellitus without complications: Secondary | ICD-10-CM | POA: Diagnosis not present

## 2023-04-30 DIAGNOSIS — H5203 Hypermetropia, bilateral: Secondary | ICD-10-CM | POA: Diagnosis not present

## 2023-04-30 DIAGNOSIS — Z961 Presence of intraocular lens: Secondary | ICD-10-CM | POA: Diagnosis not present

## 2023-05-01 ENCOUNTER — Other Ambulatory Visit: Payer: Self-pay | Admitting: Cardiology

## 2023-05-09 DIAGNOSIS — R509 Fever, unspecified: Secondary | ICD-10-CM | POA: Diagnosis not present

## 2023-05-09 DIAGNOSIS — R52 Pain, unspecified: Secondary | ICD-10-CM | POA: Diagnosis not present

## 2023-05-09 DIAGNOSIS — R059 Cough, unspecified: Secondary | ICD-10-CM | POA: Diagnosis not present

## 2023-06-03 DIAGNOSIS — K219 Gastro-esophageal reflux disease without esophagitis: Secondary | ICD-10-CM | POA: Diagnosis not present

## 2023-06-03 DIAGNOSIS — E559 Vitamin D deficiency, unspecified: Secondary | ICD-10-CM | POA: Diagnosis not present

## 2023-06-03 DIAGNOSIS — E785 Hyperlipidemia, unspecified: Secondary | ICD-10-CM | POA: Diagnosis not present

## 2023-06-03 DIAGNOSIS — G629 Polyneuropathy, unspecified: Secondary | ICD-10-CM | POA: Diagnosis not present

## 2023-06-03 DIAGNOSIS — R634 Abnormal weight loss: Secondary | ICD-10-CM | POA: Diagnosis not present

## 2023-06-03 DIAGNOSIS — I700 Atherosclerosis of aorta: Secondary | ICD-10-CM | POA: Diagnosis not present

## 2023-06-03 DIAGNOSIS — E118 Type 2 diabetes mellitus with unspecified complications: Secondary | ICD-10-CM | POA: Diagnosis not present

## 2023-06-03 DIAGNOSIS — I10 Essential (primary) hypertension: Secondary | ICD-10-CM | POA: Diagnosis not present

## 2023-06-03 DIAGNOSIS — E538 Deficiency of other specified B group vitamins: Secondary | ICD-10-CM | POA: Diagnosis not present

## 2023-06-03 DIAGNOSIS — F419 Anxiety disorder, unspecified: Secondary | ICD-10-CM | POA: Diagnosis not present

## 2023-06-05 ENCOUNTER — Other Ambulatory Visit: Payer: Self-pay | Admitting: Cardiology

## 2023-06-18 DIAGNOSIS — Z1231 Encounter for screening mammogram for malignant neoplasm of breast: Secondary | ICD-10-CM | POA: Diagnosis not present

## 2023-06-22 DIAGNOSIS — Z1231 Encounter for screening mammogram for malignant neoplasm of breast: Secondary | ICD-10-CM | POA: Diagnosis not present

## 2023-07-06 DIAGNOSIS — Z6830 Body mass index (BMI) 30.0-30.9, adult: Secondary | ICD-10-CM | POA: Diagnosis not present

## 2023-07-06 DIAGNOSIS — R634 Abnormal weight loss: Secondary | ICD-10-CM | POA: Diagnosis not present

## 2023-08-26 ENCOUNTER — Other Ambulatory Visit: Payer: Self-pay

## 2023-08-26 ENCOUNTER — Ambulatory Visit: Attending: Cardiology | Admitting: Cardiology

## 2023-08-26 VITALS — BP 120/64 | HR 64 | Ht 65.0 in | Wt 173.6 lb

## 2023-08-26 DIAGNOSIS — I2510 Atherosclerotic heart disease of native coronary artery without angina pectoris: Secondary | ICD-10-CM | POA: Diagnosis not present

## 2023-08-26 DIAGNOSIS — I10 Essential (primary) hypertension: Secondary | ICD-10-CM

## 2023-08-26 DIAGNOSIS — R0609 Other forms of dyspnea: Secondary | ICD-10-CM | POA: Diagnosis not present

## 2023-08-26 DIAGNOSIS — E119 Type 2 diabetes mellitus without complications: Secondary | ICD-10-CM | POA: Diagnosis not present

## 2023-08-26 DIAGNOSIS — I25119 Atherosclerotic heart disease of native coronary artery with unspecified angina pectoris: Secondary | ICD-10-CM | POA: Diagnosis not present

## 2023-08-26 DIAGNOSIS — U071 COVID-19: Secondary | ICD-10-CM | POA: Insufficient documentation

## 2023-08-26 NOTE — Patient Instructions (Signed)
 Medication Instructions:  Your physician recommends that you continue on your current medications as directed. Please refer to the Current Medication list given to you today.  *If you need a refill on your cardiac medications before your next appointment, please call your pharmacy*  Lab Work: None If you have labs (blood work) drawn today and your tests are completely normal, you will receive your results only by: MyChart Message (if you have MyChart) OR A paper copy in the mail If you have any lab test that is abnormal or we need to change your treatment, we will call you to review the results.  Testing/Procedures: Your physician has requested that you have an echocardiogram. Echocardiography is a painless test that uses sound waves to create images of your heart. It provides your doctor with information about the size and shape of your heart and how well your heart's chambers and valves are working. This procedure takes approximately one hour. There are no restrictions for this procedure. Please do NOT wear cologne, perfume, aftershave, or lotions (deodorant is allowed). Please arrive 15 minutes prior to your appointment time.  Please note: We ask at that you not bring children with you during ultrasound (echo/ vascular) testing. Due to room size and safety concerns, children are not allowed in the ultrasound rooms during exams. Our front office staff cannot provide observation of children in our lobby area while testing is being conducted. An adult accompanying a patient to their appointment will only be allowed in the ultrasound room at the discretion of the ultrasound technician under special circumstances. We apologize for any inconvenience.    [facility] Nuclear Imaging [address] [place], [place] [address] Phone:  [phone]    Please arrive 15 minutes prior to your appointment time for registration and insurance purposes.  The test will take approximately 3 to  4 hours to complete; you may bring reading material.  If someone comes with you to your appointment, they will need to remain in the main lobby due to limited space in the testing area. **If you are pregnant or breastfeeding, please notify the nuclear lab prior to your appointment**  How to prepare for your Myocardial Perfusion Test: Do not eat or drink 3 hours prior to your test, except you may have water. Do not consume products containing caffeine (regular or decaffeinated) 12 hours prior to your test. (ex: coffee, chocolate, sodas, tea). Do bring a list of your current medications with you.  If not listed below, you may take your medications as normal. HOLD diabetic medication/insulin  the morning of the test: Metformin Do wear comfortable clothes (no dresses or overalls) and walking shoes, tennis shoes preferred (No heels or open toe shoes are allowed). Do NOT wear cologne, perfume, aftershave, or lotions (deodorant is allowed). If these instructions are not followed, your test will have to be rescheduled.  Please report to [address] for your test.  If you have questions or concerns about your appointment, you can call the [facility]  Nuclear Imaging Lab at [phone].  If you cannot keep your appointment, please provide 24 hours notification to the Nuclear Lab, to avoid a possible $50 charge to your account.   Follow-Up: At [facility], you and your health needs are our priority.  As part of our continuing mission to provide you with exceptional heart care, our providers are all part of one team.  This team includes your primary Cardiologist (physician) and Advanced Practice Providers or APPs (Physician  Assistants and Nurse Practitioners) who all work together to provide you with the care you need, when you need it.  Your next appointment:   6 month(s)  Provider:   Ralene Burger, MD    We recommend signing up for the patient portal called "MyChart".   Sign up information is provided on this After Visit Summary.  MyChart is used to connect with patients for Virtual Visits (Telemedicine).  Patients are able to view lab/test results, encounter notes, upcoming appointments, etc.  Non-urgent messages can be sent to your provider as well.   To learn more about what you can do with MyChart, go to [email or web address].   Other Instructions None

## 2023-08-26 NOTE — Progress Notes (Unsigned)
 Cardiology Office Note:    Date:  08/26/2023   ID:  Karen Baker, DOB [birth year], MRN [id number]  PCP:  Barney Boozer, NP  Cardiologist:  Ralene Burger, MD    Referring MD: Barney Boozer, NP   No chief complaint on file.   History of Present Illness:    Karen Baker is a 73 y.o. female past medical history significant for obstructive sleep apnea follow-up by internal medicine team, she is on BiPAP mask, hyperlipidemia, essential hypertension, coronary artery disease.  In 2021 she had cardiac catheterization which showed 35% stenosis of proximal mid LAD, second diagonal branch had 70% stenosis, posterior descending artery 35% stenosis.  Ejection fraction was normal at that time.  Comes today to my office for follow-up she said she gets short of breath easier than before walking up stairs will be for shortness of breath sometimes chest pain.  She also describes current situation she said sometimes when she lay down at night she have to wake up because of shortness of breath she said to sit up look like she described paroxysmal nocturnal dyspnea.  Past Medical History:  Diagnosis Date   Anxiety    takes Xanax  daily   Arthritis    Constipation    takes Colace daily   COVID-19    Diabetes mellitus without complication (HCC)    Dyspnea    with exertion    Essential hypertension 04/29/2019   GERD (gastroesophageal reflux disease)    takes Omeprazole daily   History of blood transfusion    no abnormal reaction noted   History of bronchitis 3-48yrs ago   Hyperlipidemia    takes Lovastatin daily   Hyperparathyroidism, primary (HCC) 09/02/2020   Hypertension    takes Amlodipine  daily   Joint pain    Joint swelling    Mixed hyperlipidemia 04/29/2019   Neoplasm, 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   Obstructive sleep apnea syndrome, severe 10/11/2019   Pneumonia    hx of covid 03/2019 and then developed peumonia afterwards    Pre-diabetes    Primary osteoarthritis of left hip 05/30/2014    Primary osteoarthritis of right hip 04/03/2020   Sleep apnea    bipap    Type 2 diabetes mellitus with complication, without long-term current use of insulin  (HCC) 06/25/2019   Unstable angina (HCC) 06/25/2019   Urinary frequency    Urinary urgency     Past Surgical History:  Procedure Laterality Date   ABDOMINAL HYSTERECTOMY  1972   APPENDECTOMY  1969   CATARACT EXTRACTION W/ INTRAOCULAR LENS IMPLANT Bilateral    CHOLECYSTECTOMY     COLONOSCOPY     cyst removed from ovary     knee athroscopy Right    knots removed from neck     LEFT HEART CATH AND CORONARY ANGIOGRAPHY N/A 06/27/2019   Procedure: LEFT HEART CATH AND CORONARY ANGIOGRAPHY;  Surgeon: [place], Peter M, MD;  [facility];  Service: Cardiovascular;  Laterality: N/A;   PARATHYROIDECTOMY Right 09/03/2020   Procedure: RIGHT INFERIOR PARATHYROIDECTOMY;  Surgeon: Oralee Billow, MD;  Location: [facility];  Service: General;  Laterality: Right;   TONSILLECTOMY  1956   TOTAL HIP ARTHROPLASTY Left 05/30/2014   Procedure: LEFT TOTAL HIP ARTHROPLASTY ANTERIOR APPROACH;  Surgeon: Alphonzo Ask, MD;  Location: [facility];  Service: Orthopedics;  Laterality: Left;   TOTAL HIP ARTHROPLASTY Right 04/03/2020   Procedure: RIGHT TOTAL HIP ARTHROPLASTY ANTERIOR APPROACH;  Surgeon: Dayne Even, MD;  Location: [facility];  Service: Orthopedics;  Laterality: Right;  tumors removed from arches in feet Bilateral     Current Medications: Current Meds  Medication Sig   alendronate (FOSAMAX) 70 MG tablet Take 70 mg by mouth every Sunday. Take with a full glass of water on an empty stomach.   Cholecalciferol (VITAMIN D3) 50 MCG (2000 UT) TABS Take 2,000 Units by mouth in the morning.   escitalopram (LEXAPRO) 10 MG tablet Take 10 mg by mouth daily.   ferrous sulfate 325 (65 FE) MG tablet Take 325 mg by mouth in the morning.   gabapentin (NEURONTIN) 300 MG capsule Take 300 mg by mouth at bedtime.   loratadine (CLARITIN) 10 MG tablet Take 10 mg  by mouth daily.   metFORMIN (GLUCOPHAGE-XR) 500 MG 24 hr tablet Take 500 mg by mouth in the morning.   nitroGLYCERIN  (NITROSTAT ) 0.4 MG SL tablet Place 1 tablet (0.4 mg total) under the tongue every 5 (five) minutes x 3 doses as needed for chest pain.   pantoprazole  (PROTONIX ) 40 MG tablet Take 40 mg by mouth in the morning.     Allergies:   Azithromycin and Tape   Social History   Socioeconomic History   Marital status: Married    Spouse name: Not on file   Number of children: Not on file   Years of education: Not on file   Highest education level: Not on file  Occupational History   Not on file  Tobacco Use   Smoking status: Former    Current packs/day: 0.00    Average packs/day: 1 pack/day for 20.0 years (20.0 ttl pk-yrs)    Types: Cigarettes    Start date: 02/17/1984    Quit date: 02/17/2004    Years since quitting: 19.5   Smokeless tobacco: Never   Tobacco comments:    no cigs in over a week  Vaping Use   Vaping status: Never Used  Substance and Sexual Activity   Alcohol use: No   Drug use: No   Sexual activity: Yes    Birth control/protection: Surgical  Other Topics Concern   Not on file  Social History Narrative   Not on file   Social Drivers of Health   Financial Resource Strain: Not on file  Food Insecurity: Not on file  Transportation Needs: Not on file  Physical Activity: Not on file  Stress: Not on file  Social Connections: Not on file     Family History: The patient's family history includes Cancer in her sister; Hypertension in her brother and mother; Stroke in her mother. ROS:   Please see the history of present illness.    All 14 point review of systems negative except as described per history of present illness  EKGs/Labs/Other Studies Reviewed:    EKG Interpretation Date/Time:  Wednesday Aug 26 2023 16:02:01 EDT Ventricular Rate:  64 PR Interval:  170 QRS Duration:  132 QT Interval:  478 QTC Calculation: 493 R Axis:   -59  Text  Interpretation: Normal sinus rhythm Right bundle branch block Left anterior fascicular block Bifascicular block Moderate voltage criteria for LVH, may be normal variant Abnormal ECG When compared with ECG of 24-Aug-2020 10:03, No significant change was found Confirmed by Ralene Burger [phone]) on 08/26/2023 4:05:38 PM    Recent Labs: No results found for requested labs within last 365 days.  Recent Lipid Panel    Component Value Date/Time   CHOL 96 06/25/2019 0037   TRIG 112 06/25/2019 0037   HDL 32 (L) 06/25/2019 0037   CHOLHDL 3.0 06/25/2019  0037   VLDL 22 06/25/2019 0037   LDLCALC 42 06/25/2019 0037    Physical Exam:    VS:  BP 120/64   Pulse 64   Ht 5\' 5"  (1.651 m)   Wt 173 lb 9.6 oz (78.7 kg)   SpO2 95%   BMI 28.89 kg/m     Wt Readings from Last 3 Encounters:  08/26/23 173 lb 9.6 oz (78.7 kg)  12/31/21 193 lb (87.5 kg)  07/10/21 191 lb 9.6 oz (86.9 kg)     GEN:  Well nourished, well developed in no acute distress HEENT: Normal NECK: No JVD; No carotid bruits LYMPHATICS: No lymphadenopathy CARDIAC: RRR, no murmurs, no rubs, no gallops RESPIRATORY:  Clear to auscultation without rales, wheezing or rhonchi  ABDOMEN: Soft, non-tender, non-distended MUSCULOSKELETAL:  No edema; No deformity  SKIN: Warm and dry LOWER EXTREMITIES: no swelling NEUROLOGIC:  Alert and oriented x 3 PSYCHIATRIC:  Normal affect   ASSESSMENT:    1. Coronary artery disease involving native coronary artery of native heart with angina pectoris (HCC)   2. Essential hypertension   3. Coronary artery disease involving native coronary artery of native heart without angina pectoris   4. Diabetes mellitus without complication (HCC)   5. Dyspnea on exertion    PLAN:    In order of problems listed above:  Coronary disease with some worrisome symptoms.  Will schedule her to have a stress test.  In the meantime I discovered that she stopped taking aspirin  because of bruises that she got in her arm  ask her to go back on it. Dyspnea on exertion with some symptoms that suggest paroxysmal nocturnal dyspnea, I will schedule her to have echocardiogram to assess left ventricular ejection fraction Lipidemia that is followed by internal medicine team I did review K PN and which show me LDL of 51 HDL 47 good cholesterol control continue present management   Medication Adjustments/Labs and Tests Ordered: Current medicines are reviewed at length with the patient today.  Concerns regarding medicines are outlined above.  Orders Placed This Encounter  Procedures   EKG 12-Lead   Medication changes: No orders of the defined types were placed in this encounter.   Signed, Manfred Seed, MD, [facility] 08/26/2023 4:16 PM    [facility]

## 2023-09-07 DIAGNOSIS — Z79899 Other long term (current) drug therapy: Secondary | ICD-10-CM | POA: Diagnosis not present

## 2023-09-07 DIAGNOSIS — I2510 Atherosclerotic heart disease of native coronary artery without angina pectoris: Secondary | ICD-10-CM | POA: Diagnosis not present

## 2023-09-07 DIAGNOSIS — I480 Paroxysmal atrial fibrillation: Secondary | ICD-10-CM | POA: Diagnosis not present

## 2023-09-07 DIAGNOSIS — K219 Gastro-esophageal reflux disease without esophagitis: Secondary | ICD-10-CM | POA: Diagnosis not present

## 2023-09-07 DIAGNOSIS — E785 Hyperlipidemia, unspecified: Secondary | ICD-10-CM | POA: Diagnosis not present

## 2023-09-07 DIAGNOSIS — R0789 Other chest pain: Secondary | ICD-10-CM | POA: Diagnosis not present

## 2023-09-07 DIAGNOSIS — E119 Type 2 diabetes mellitus without complications: Secondary | ICD-10-CM | POA: Diagnosis not present

## 2023-09-07 DIAGNOSIS — R5383 Other fatigue: Secondary | ICD-10-CM | POA: Diagnosis not present

## 2023-09-07 DIAGNOSIS — I10 Essential (primary) hypertension: Secondary | ICD-10-CM | POA: Diagnosis not present

## 2023-09-07 DIAGNOSIS — R0600 Dyspnea, unspecified: Secondary | ICD-10-CM | POA: Diagnosis not present

## 2023-09-07 DIAGNOSIS — J449 Chronic obstructive pulmonary disease, unspecified: Secondary | ICD-10-CM | POA: Diagnosis not present

## 2023-09-07 DIAGNOSIS — Z7984 Long term (current) use of oral hypoglycemic drugs: Secondary | ICD-10-CM | POA: Diagnosis not present

## 2023-09-07 DIAGNOSIS — I272 Pulmonary hypertension, unspecified: Secondary | ICD-10-CM | POA: Diagnosis not present

## 2023-09-07 DIAGNOSIS — R079 Chest pain, unspecified: Secondary | ICD-10-CM | POA: Diagnosis not present

## 2023-09-08 DIAGNOSIS — R001 Bradycardia, unspecified: Secondary | ICD-10-CM | POA: Diagnosis not present

## 2023-09-08 DIAGNOSIS — I4510 Unspecified right bundle-branch block: Secondary | ICD-10-CM | POA: Diagnosis not present

## 2023-09-08 DIAGNOSIS — E119 Type 2 diabetes mellitus without complications: Secondary | ICD-10-CM | POA: Diagnosis not present

## 2023-09-08 DIAGNOSIS — R079 Chest pain, unspecified: Secondary | ICD-10-CM | POA: Diagnosis not present

## 2023-09-08 DIAGNOSIS — I2510 Atherosclerotic heart disease of native coronary artery without angina pectoris: Secondary | ICD-10-CM | POA: Diagnosis not present

## 2023-09-09 ENCOUNTER — Telehealth: Payer: Self-pay | Admitting: Cardiology

## 2023-09-09 NOTE — Telephone Encounter (Signed)
 Pt calling saying she was in  [facility] from 6/9-6/10 with chest pain. She said they did her stress test and Echo while she was in the hospital so she cx the ones she had scheduled with us . She wants doctor to review those.

## 2023-09-10 NOTE — Telephone Encounter (Signed)
 Patient's stress test and echo printed and placed in MD box for review/kbl 09/10/23

## 2023-09-18 ENCOUNTER — Encounter: Payer: Self-pay | Admitting: Internal Medicine

## 2023-09-20 ENCOUNTER — Ambulatory Visit: Payer: Self-pay | Admitting: Cardiology

## 2023-09-22 DIAGNOSIS — R0789 Other chest pain: Secondary | ICD-10-CM | POA: Diagnosis not present

## 2023-09-22 DIAGNOSIS — Z6831 Body mass index (BMI) 31.0-31.9, adult: Secondary | ICD-10-CM | POA: Diagnosis not present

## 2023-09-22 DIAGNOSIS — Z79899 Other long term (current) drug therapy: Secondary | ICD-10-CM | POA: Diagnosis not present

## 2023-09-22 DIAGNOSIS — Z09 Encounter for follow-up examination after completed treatment for conditions other than malignant neoplasm: Secondary | ICD-10-CM | POA: Diagnosis not present

## 2023-09-22 DIAGNOSIS — Z9181 History of falling: Secondary | ICD-10-CM | POA: Diagnosis not present

## 2023-09-28 NOTE — Progress Notes (Unsigned)
 Cardiology Office Note   Date:  09/30/2023  ID:  Karen Baker, DOB 06/14/1950, MRN [id number] PCP: Pandora Therisa RAMAN, NP  [facility] Providers Cardiologist:  Lamar Fitch, MD     History of Present Illness Karen Baker is a 73 y.o. female with a past medical history of non-obstructive CAD, hypertension, OSA on BiPAP, GERD, DM2, dyslipidemia.  09/08/2023 echo EF 55 to 60% 09/08/2023 Lexiscan  uneventful 07/18/2021 echo EF 55 to 60%, grade 1 DD 06/27/2019 cardiac cath nonobstructive CAD 06/25/2019 echo EF 60 to 65% 02/18/2019 coronary CTA severe CAD in the proximal PDA moderate stenosis in the mid LAD, calcium  score 1003, 97th percentile, FFR showed hemodynamically significant lesion in the proximal PDA  [facility] with [facility] 2020 at the behest of her PCP for evaluation of an abnormal EKG.  She underwent a coronary CTA revealing a calcium  score 1003, FFR showed hemodynamically significant lesion in the proximal PDA, she ultimately underwent left heart cath in 2021 revealing nonobstructive CAD.  Most recently evaluated by Dr. Krasowski on 08/26/2023 for shortness of breath, and orthopnea.  Admitted to [facility] 09/07/2023 for chest pain, lab work significant for leukocytosis, proBNP 1500 troponin 0.03 >>0.01 >>0.22 >>0.23 >>0.16, echo was unrevealing, Lexiscan  was negative for myocardial ischemia and it was felt her symptoms of chest pain were likely musculoskeletal in nature.  She presents today for follow up after her most recent ED evaluation. She apparently ran out of her Imdur  and was restarted by her PCP. She has been doing ok, feels back to her baseline, still has some SOB, her proBNP was elevated. She denies chest pain, palpitations, dyspnea, pnd, orthopnea, n, v, dizziness, syncope, edema, weight gain, or early satiety.    ROS: Review of Systems  Respiratory:  Positive for shortness of breath.   All other systems reviewed and are negative.    Studies Reviewed       Cardiac Studies & Procedures   ______________________________________________________________________________________________ CARDIAC CATHETERIZATION  CARDIAC CATHETERIZATION 06/27/2019  Conclusion  Prox LAD to Mid LAD lesion is 35% stenosed.  2nd Diag lesion is 70% stenosed.  LPDA lesion is 35% stenosed.  Ost RCA lesion is 25% stenosed.  The left ventricular systolic function is normal.  LV end diastolic pressure is normal.  The left ventricular ejection fraction is 55-65% by visual estimate.  1. Nonobstructive CAD 2. Normal LV function 3. Elevated LVEDP 24 mm Hg.  Plan: medical management. Patient may be a candidate for DC later today.  Findings Coronary Findings Diagnostic  Dominance: Left  Left Main Vessel was injected. Vessel is normal in caliber. Vessel is angiographically normal.  Left Anterior Descending Prox LAD to Mid LAD lesion is 35% stenosed.  Second Diagonal Branch 2nd Diag lesion is 70% stenosed.  Left Circumflex Vessel was injected. Vessel is large. There is mild diffuse disease throughout the vessel.  Left Posterior Descending Artery LPDA lesion is 35% stenosed.  Right Coronary Artery Ost RCA lesion is 25% stenosed.  Intervention  No interventions have been documented.     ECHOCARDIOGRAM  ECHOCARDIOGRAM COMPLETE 07/18/2021  Narrative ECHOCARDIOGRAM REPORT    Patient Name:   Karen Baker Date of Exam: 07/18/2021 Medical Rec #:  [id number]     Height:       63.5 in Accession #:    [id number]    Weight:       191.6 lb Date of Birth:  1951-02-21     BSA:          1.910 m  Patient Age:    70 years      BP:           126/70 mmHg Patient Gender: F             HR:           77 bpm. Exam Location:  [facility]  Procedure: 2D Echo, Cardiac Doppler, Color Doppler and Strain Analysis  Indications:    Coronary artery disease involving native coronary artery of native heart with angina pectoris (HCC) [I25.119 (ICD-10-CM)]; Essential  hypertension [I10 (ICD-10-CM)]; Obstructive sleep apnea syndrome [G47.33 (ICD-10-CM)]; Mixed hyperlipidemia [E78.2 (ICD-10-CM)]  History:        Patient has prior history of Echocardiogram examinations, most recent 06/25/2019. CAD; Risk Factors:Hypertension and Dyslipidemia.  Sonographer:    Charlie Jointer RDCS Referring Phys: [id number] ROBERT J KRASOWSKI  IMPRESSIONS   1. GLS -20.8. Left ventricular ejection fraction, by estimation, is 55 to 60%. The left ventricle has normal function. The left ventricle has no regional wall motion abnormalities. Left ventricular diastolic parameters are consistent with Grade I diastolic dysfunction (impaired relaxation). 2. Right ventricular systolic function is normal. The right ventricular size is normal. 3. The mitral valve is normal in structure. No evidence of mitral valve regurgitation. No evidence of mitral stenosis. 4. The aortic valve is normal in structure. Aortic valve regurgitation is not visualized. No aortic stenosis is present. 5. The inferior vena cava is normal in size with greater than 50% respiratory variability, suggesting right atrial pressure of 3 mmHg.  FINDINGS Left Ventricle: GLS -20.8. Left ventricular ejection fraction, by estimation, is 55 to 60%. The left ventricle has normal function. The left ventricle has no regional wall motion abnormalities. The left ventricular internal cavity size was normal in size. There is no left ventricular hypertrophy. Left ventricular diastolic parameters are consistent with Grade I diastolic dysfunction (impaired relaxation).  Right Ventricle: The right ventricular size is normal. No increase in right ventricular wall thickness. Right ventricular systolic function is normal.  Left Atrium: Left atrial size was normal in size.  Right Atrium: Right atrial size was normal in size.  Pericardium: There is no evidence of pericardial effusion.  Mitral Valve: The mitral valve is normal in structure.  No evidence of mitral valve regurgitation. No evidence of mitral valve stenosis.  Tricuspid Valve: The tricuspid valve is normal in structure. Tricuspid valve regurgitation is not demonstrated. No evidence of tricuspid stenosis.  Aortic Valve: The aortic valve is normal in structure. Aortic valve regurgitation is not visualized. No aortic stenosis is present.  Pulmonic Valve: The pulmonic valve was normal in structure. Pulmonic valve regurgitation is not visualized. No evidence of pulmonic stenosis.  Aorta: The aortic root is normal in size and structure.  Venous: The inferior vena cava is normal in size with greater than 50% respiratory variability, suggesting right atrial pressure of 3 mmHg.  IAS/Shunts: No atrial level shunt detected by color flow Doppler.   LEFT VENTRICLE PLAX 2D LVIDd:         4.20 cm   Diastology LVIDs:         3.10 cm   LV e' medial:    5.11 cm/s LV PW:         0.90 cm   LV E/e' medial:  20.5 LV IVS:        1.30 cm   LV e' lateral:   7.07 cm/s LVOT diam:     1.90 cm   LV E/e' lateral: 14.9 LV SV:  71 LV SV Index:   37 LVOT Area:     2.84 cm   RIGHT VENTRICLE RV Basal diam:  2.40 cm RV S prime:     12.60 cm/s TAPSE (M-mode): 2.6 cm  LEFT ATRIUM             Index        RIGHT ATRIUM           Index LA diam:        3.70 cm 1.94 cm/m   RA Area:     11.10 cm LA Vol (A2C):   37.0 ml 19.37 ml/m  RA Volume:   19.10 ml  10.00 ml/m LA Vol (A4C):   43.9 ml 22.99 ml/m LA Biplane Vol: 42.6 ml 22.31 ml/m AORTIC VALVE LVOT Vmax:   132.00 cm/s LVOT Vmean:  93.800 cm/s LVOT VTI:    0.249 m  AORTA Ao Root diam: 3.30 cm Ao Asc diam:  3.00 cm  MITRAL VALVE MV Area (PHT): 3.60 cm     SHUNTS MV Decel Time: 211 msec     Systemic VTI:  0.25 m MV E velocity: 105.00 cm/s  Systemic Diam: 1.90 cm MV A velocity: 135.00 cm/s MV E/A ratio:  0.78  Lamar Fitch MD Electronically signed by Lamar Fitch MD Signature Date/Time: 07/18/2021/6:04:39  PM    Final      CT SCANS  CT CORONARY MORPH W/CTA COR W/SCORE 04/26/2019  Addendum 04/27/2019 10:12 PM ADDENDUM REPORT: 04/27/2019 22:10  HISTORY: Chest pain  EXAM: Cardiac/Coronary  CT  TECHNIQUE: The patient was scanned on a [employer] scanner.  PROTOCOL: A 120 kV prospective scan was triggered in the descending thoracic aorta at 111 HU's. Axial non-contrast 3 mm slices were carried out through the heart. The data set was analyzed on a dedicated work station and scored using the Agatson method. Gantry rotation speed was 250 msecs and collimation was .6 mm. Beta blockade and 0.8 mg of sl NTG was given. The 3D data set was reconstructed in 5% intervals of the 67-82 % of the R-R cycle. Diastolic phases were analyzed on a dedicated work station using MPR, MIP and VRT modes. The patient received 80mL OMNIPAQUE  IOHEXOL  350 MG/ML SOLN of contrast.  FINDINGS: Coronary calcium  score: The patient's coronary artery calcium  score is 1003, which places the patient in the 97th percentile.  Coronary arteries: Normal coronary origins.  Left dominance.  Right Coronary Artery: Diminutive, nondominant RCA with mild mixed atherosclerotic plaque in the proximal RCA, 25-49% stenosis.  Left Main Coronary Artery: No detectable plaque or stenosis.  Left Anterior Descending Coronary Artery: Tubular mild mixed atherosclerotic plaque in the proximal LAD, 25-49% stenosis. Mild focal mixed atherosclerotic plaque in the mid LAD (25-49% stenosis) at the bifurcation of the first diagonal. Moderate ostial first diagonal mixed atherosclerotic plaque 50-69% stenosis. Moderate focal mixed atherosclerotic plaque in the mid LAD50-69% stenosis.  Left Circumflex Artery: Mild mixed atherosclerotic plaque in the proximal circumflex artery, 25-49% stenosis. Moderate mixed atherosclerotic plaque in the distal left circumflex 50-69% stenosis. The proximal posterior interventricular branch/PDA has  a severe atherosclerotic plaque with 70-99% stenosis.  Aorta: Normal size, 29 mm at the mid ascending aorta (level of the PA bifurcation) measured double oblique. Mild mixed atherosclerotic plaque in the descending thoracic aorta. No dissection.  Aortic Valve: Trivial annular calcifications.  Other findings:  Normal pulmonary vein drainage into the left atrium.  Normal left atrial appendage without a thrombus.  Normal size of the pulmonary artery.  Moderate  mitral annular calcification  IMPRESSION: 1. 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 Moderate stenosis in the mid LAD, ostial first diagonal, and distal left circumflex. CT FFR will be performed and reported separately.  2. The patient's coronary artery calcium  score is 1003, which places the patient in the 97th percentile.  3. Normal coronary origin with left dominance.   Electronically Signed By: Soyla Merck On: 04/27/2019 22:10  Narrative EXAM: OVER-READ INTERPRETATION  CT CHEST  The following report is an over-read performed by radiologist Dr. Rockey Kilts of [facility] on 04/26/2019. This over-read does not include interpretation of cardiac or coronary anatomy or pathology. The coronary CTA interpretation by the cardiologist is attached.  COMPARISON:  Chest radiograph 05/19/2014.  FINDINGS: Vascular: Aortic atherosclerosis. No dissection or aneurysm. No central pulmonary embolism, on this non-dedicated study.  Mediastinum/Nodes: No imaged thoracic adenopathy. Small hiatal hernia.  Lungs/Pleura: No pleural fluid.  Clear imaged lungs.  Upper Abdomen: Normal imaged portions of the liver, spleen.  Musculoskeletal: Mild thoracic spondylosis.  IMPRESSION: 1.  No acute findings in the imaged extracardiac chest. 2.  Aortic Atherosclerosis (ICD10-I70.0). 3. Small hiatal hernia.  Electronically Signed: By: Rockey Kilts M.D. On: 04/26/2019 17:01   CT CORONARY FRACTIONAL FLOW  RESERVE DATA PREP 04/26/2019  Narrative EXAM: CT FFR ANALYSIS  CLINICAL DATA:  Chest pain  FINDINGS: FFRct analysis was performed on the original cardiac CT angiogram dataset. Diagrammatic representation of the FFRct analysis is provided in a separate PDF document in PACS. This dictation was created using the PDF document and an interactive 3D model of the results. 3D model is not available in the EMR/PACS. Normal FFR range is >0.80.  1. Left Main:  No significant stenosis. FFR = 0.98  2. LAD: No significant stenosis. Proximal FFR = 0.94, Mid FFR = 0.89, Distal FFR = could not be mapped 3. LCX: No significant stenosis. Proximal FFR = 0.97, Distal FFR = 0.82, PDA FFR = 0.71, distally 0.66 4. RCA: No significant stenosis. Proximal FFR = 0.93, remainder of vessel could not be mapped.  IMPRESSION: 1. CT FFR showed hemodynamically significant lesion in the proximal PDA arising from the large, dominant left circumflex artery. PDA measures 2.3 mm proximal to the stenosis and 2 mm distal to the stenosis.   Electronically Signed By: Soyla Merck On: 04/27/2019 22:16     ______________________________________________________________________________________________      Risk Assessment/Calculations           Physical Exam VS:  BP 110/62 (BP Location: Left Arm, Patient Position: Sitting)   Pulse 67   Ht 5' 5 (1.651 m)   Wt 177 lb 9.6 oz (80.6 kg)   SpO2 92%   BMI 29.55 kg/m        Wt Readings from Last 3 Encounters:  09/30/23 177 lb 9.6 oz (80.6 kg)  08/26/23 173 lb 9.6 oz (78.7 kg)  12/31/21 193 lb (87.5 kg)    GEN: Well nourished, well developed in no acute distress NECK: No JVD; No carotid bruits CARDIAC: RRR, no murmurs, rubs, gallops RESPIRATORY:  Clear to auscultation without rales, wheezing or rhonchi  ABDOMEN: Soft, non-tender, non-distended EXTREMITIES:  No edema; No deformity   ASSESSMENT AND PLAN CAD - non-obstructive per cCTA >> ischemic  evaluation during her hospitalization was normal, low risk. Stable with no anginal symptoms. No indication for ischemic evaluation.  Continue aspirin  81 mg daily, nitroglycerin  PRN, Imdur  60 mg daily.   DOE - will repeat proBNP and BMET.  OSA on BiPAP - compliant.   Hypertension - BP today is controlled 110/62, not on any hypertensive agents.        Dispo: ProBNP, BMET, follow up in 3 months.   Signed, Delon JAYSON Hoover, NP

## 2023-09-29 ENCOUNTER — Encounter

## 2023-09-29 ENCOUNTER — Other Ambulatory Visit

## 2023-09-30 ENCOUNTER — Encounter: Payer: Self-pay | Admitting: Cardiology

## 2023-09-30 ENCOUNTER — Ambulatory Visit: Attending: Cardiology | Admitting: Cardiology

## 2023-09-30 VITALS — BP 110/62 | HR 67 | Ht 65.0 in | Wt 177.6 lb

## 2023-09-30 DIAGNOSIS — I10 Essential (primary) hypertension: Secondary | ICD-10-CM | POA: Diagnosis not present

## 2023-09-30 DIAGNOSIS — I2510 Atherosclerotic heart disease of native coronary artery without angina pectoris: Secondary | ICD-10-CM

## 2023-09-30 DIAGNOSIS — R0609 Other forms of dyspnea: Secondary | ICD-10-CM

## 2023-09-30 DIAGNOSIS — G4733 Obstructive sleep apnea (adult) (pediatric): Secondary | ICD-10-CM | POA: Diagnosis not present

## 2023-09-30 DIAGNOSIS — E782 Mixed hyperlipidemia: Secondary | ICD-10-CM | POA: Diagnosis not present

## 2023-09-30 MED ORDER — ISOSORBIDE MONONITRATE ER 60 MG PO TB24: 60.0000 mg | ORAL_TABLET | Freq: Every day | ORAL | 3 refills | Status: AC

## 2023-09-30 NOTE — Patient Instructions (Signed)
 Medication Instructions:  Your physician recommends that you continue on your current medications as directed. Please refer to the Current Medication list given to you today.  *If you need a refill on your cardiac medications before your next appointment, please call your pharmacy*   Lab Work: BMP, ProBNP- today If you have labs (blood work) drawn today and your tests are completely normal, you will receive your results only by: MyChart Message (if you have MyChart) OR A paper copy in the mail If you have any lab test that is abnormal or we need to change your treatment, we will call you to review the results.   Testing/Procedures: None Ordered   Follow-Up: At [facility], you and your health needs are our priority.  As part of our continuing mission to provide you with exceptional heart care, we have created designated Provider Care Teams.  These Care Teams include your primary Cardiologist (physician) and Advanced Practice Providers (APPs -  Physician Assistants and Nurse Practitioners) who all work together to provide you with the care you need, when you need it.  We recommend signing up for the patient portal called MyChart.  Sign up information is provided on this After Visit Summary.  MyChart is used to connect with patients for Virtual Visits (Telemedicine).  Patients are able to view lab/test results, encounter notes, upcoming appointments, etc.  Non-urgent messages can be sent to your provider as well.   To learn more about what you can do with MyChart, go to [email or web address].    Your next appointment:   3 month(s) with Dr. Krasowski  The format for your next appointment:   In Person  Provider:   Delon Hoover, NP   Other Instructions NA

## 2023-10-01 ENCOUNTER — Ambulatory Visit: Payer: Self-pay | Admitting: Cardiology

## 2023-10-01 DIAGNOSIS — I10 Essential (primary) hypertension: Secondary | ICD-10-CM

## 2023-10-01 LAB — BASIC METABOLIC PANEL WITH GFR
BUN/Creatinine Ratio: 18 (ref 12–28)
BUN: 17 mg/dL (ref 8–27)
CO2: 22 mmol/L (ref 20–29)
Calcium: 10.4 mg/dL — ABNORMAL HIGH (ref 8.7–10.3)
Chloride: 101 mmol/L (ref 96–106)
Creatinine, Ser: 0.97 mg/dL (ref 0.57–1.00)
Glucose: 71 mg/dL (ref 70–99)
Potassium: 5 mmol/L (ref 3.5–5.2)
Sodium: 138 mmol/L (ref 134–144)
eGFR: 61 mL/min/1.73

## 2023-10-01 LAB — PRO B NATRIURETIC PEPTIDE: NT-Pro BNP: 822 pg/mL — ABNORMAL HIGH (ref 0–738)

## 2023-10-05 ENCOUNTER — Telehealth: Payer: Self-pay

## 2023-10-05 MED ORDER — FUROSEMIDE 20 MG PO TABS: 20.0000 mg | ORAL_TABLET | Freq: Every day | ORAL | 3 refills | Status: AC

## 2023-10-05 NOTE — Telephone Encounter (Signed)
 Lasix  20mg  1 tablet daily per Delon Hoover, NP note. Sent to [facility]

## 2023-10-06 NOTE — Telephone Encounter (Signed)
-----   Message from Delon JAYSON Hoover sent at 10/01/2023  3:41 PM EDT ----- Some evidence she is holding on to too much fluid.  Start lasix  20 mg once in the am daily. Potassium is good so should not need supplementation.  Return in 2 weeks for BMET.  ----- Message ----- From: Interface, [facility] Lab Results In Sent: 10/01/2023   5:37 AM EDT To: Delon JAYSON Hoover, NP

## 2023-10-22 DIAGNOSIS — K219 Gastro-esophageal reflux disease without esophagitis: Secondary | ICD-10-CM | POA: Diagnosis not present

## 2023-10-22 DIAGNOSIS — Z6831 Body mass index (BMI) 31.0-31.9, adult: Secondary | ICD-10-CM | POA: Diagnosis not present

## 2023-10-22 DIAGNOSIS — I10 Essential (primary) hypertension: Secondary | ICD-10-CM | POA: Diagnosis not present

## 2023-10-22 DIAGNOSIS — E118 Type 2 diabetes mellitus with unspecified complications: Secondary | ICD-10-CM | POA: Diagnosis not present

## 2023-10-22 DIAGNOSIS — G629 Polyneuropathy, unspecified: Secondary | ICD-10-CM | POA: Diagnosis not present

## 2023-10-22 DIAGNOSIS — E785 Hyperlipidemia, unspecified: Secondary | ICD-10-CM | POA: Diagnosis not present

## 2023-10-22 DIAGNOSIS — F419 Anxiety disorder, unspecified: Secondary | ICD-10-CM | POA: Diagnosis not present

## 2023-10-22 DIAGNOSIS — F32A Depression, unspecified: Secondary | ICD-10-CM | POA: Diagnosis not present

## 2023-10-22 DIAGNOSIS — E559 Vitamin D deficiency, unspecified: Secondary | ICD-10-CM | POA: Diagnosis not present

## 2023-10-30 DIAGNOSIS — L728 Other follicular cysts of the skin and subcutaneous tissue: Secondary | ICD-10-CM | POA: Diagnosis not present

## 2023-10-30 DIAGNOSIS — R233 Spontaneous ecchymoses: Secondary | ICD-10-CM | POA: Diagnosis not present

## 2023-11-06 ENCOUNTER — Ambulatory Visit: Admitting: Cardiology

## 2023-11-16 DIAGNOSIS — N289 Disorder of kidney and ureter, unspecified: Secondary | ICD-10-CM | POA: Diagnosis not present

## 2023-12-10 ENCOUNTER — Encounter: Payer: Self-pay | Admitting: Cardiology

## 2023-12-10 ENCOUNTER — Ambulatory Visit: Attending: Cardiology | Admitting: Cardiology

## 2023-12-10 VITALS — BP 110/62 | HR 58 | Ht 65.0 in | Wt 178.0 lb

## 2023-12-10 DIAGNOSIS — I10 Essential (primary) hypertension: Secondary | ICD-10-CM | POA: Diagnosis not present

## 2023-12-10 DIAGNOSIS — I2510 Atherosclerotic heart disease of native coronary artery without angina pectoris: Secondary | ICD-10-CM

## 2023-12-10 DIAGNOSIS — E210 Primary hyperparathyroidism: Secondary | ICD-10-CM

## 2023-12-10 DIAGNOSIS — E782 Mixed hyperlipidemia: Secondary | ICD-10-CM

## 2023-12-10 DIAGNOSIS — G4733 Obstructive sleep apnea (adult) (pediatric): Secondary | ICD-10-CM | POA: Diagnosis not present

## 2023-12-10 MED ORDER — ATORVASTATIN CALCIUM 10 MG PO TABS: 10.0000 mg | ORAL_TABLET | Freq: Every day | ORAL | 3 refills | Status: AC

## 2023-12-10 NOTE — Patient Instructions (Signed)
 Medication Instructions:   START: Lipitor 10mg  1 tablet daily   Lab Work: Your physician recommends that you return for lab work in: 6 weeks You need to have labs done when you are fasting.  You can come Monday through Friday 8:30 am to 12:00 pm and 1:15 to 4:30. You do not need to make an appointment as the order has already been placed.     Testing/Procedures: None Ordered   Follow-Up: At [facility], you and your health needs are our priority.  As part of our continuing mission to provide you with exceptional heart care, we have created designated Provider Care Teams.  These Care Teams include your primary Cardiologist (physician) and Advanced Practice Providers (APPs -  Physician Assistants and Nurse Practitioners) who all work together to provide you with the care you need, when you need it.  We recommend signing up for the patient portal called MyChart.  Sign up information is provided on this After Visit Summary.  MyChart is used to connect with patients for Virtual Visits (Telemedicine).  Patients are able to view lab/test results, encounter notes, upcoming appointments, etc.  Non-urgent messages can be sent to your provider as well.   To learn more about what you can do with MyChart, go to [email or web address].    Your next appointment:   6 month(s)  The format for your next appointment:   In Person  Provider:   Lamar Fitch, MD    Other Instructions NA

## 2023-12-10 NOTE — Progress Notes (Signed)
 Cardiology Office Note:    Date:  12/10/2023   ID:  Karen Baker, DOB [birth year], MRN [id number]  PCP:  Pandora Therisa RAMAN, NP  Cardiologist:  Lamar Fitch, MD    Referring MD: Pandora Therisa RAMAN, NP   No chief complaint on file. I am doing fine  History of Present Illness:    Karen Baker is a 73 y.o. female past medical history significant for coronary artery disease in 2012 with 1 she did have coronary CT angio done which showed possibility of hemodynamically significant lesion and PDA after bradycardic catheterization showed nonobstructive disease recently she was in the hospital proBNP was mildly elevated troponins were minimally elevated Lexiscan  done thereafter showed no evidence of ischemia and pain was felt to be related to musculoskeletal issues.  She comes today to my office for follow-up overall cardiac wise doing well she complained of having weakness fatigue and tiredness but otherwise doing well also described to have some chest pain that happen at night.  She said she wakes up she had to sit up and her chest pain goes away.  She does not have an exertional chest pain.  Past Medical History:  Diagnosis Date   Anxiety    takes Xanax  daily   Arthritis    Constipation    takes Colace daily   Coronary artery disease 04/29/2019   COVID-19    Diabetes mellitus without complication (HCC)    Dyspnea    with exertion    Essential hypertension 04/29/2019   GERD (gastroesophageal reflux disease)    takes Omeprazole daily   History of blood transfusion    no abnormal reaction noted   History of bronchitis 3-69yrs ago   Hyperlipidemia    takes Lovastatin daily   Hyperparathyroidism, primary (HCC) 09/02/2020   Hypertension    takes Amlodipine  daily   Joint pain    Joint swelling    Mixed hyperlipidemia 04/29/2019   Neoplasm, 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   Obstructive sleep apnea syndrome, severe 10/11/2019   Pneumonia    hx of covid 03/2019 and then developed peumonia afterwards     Pre-diabetes    Primary osteoarthritis of left hip 05/30/2014   Primary osteoarthritis of right hip 04/03/2020   Sleep apnea    bipap    Type 2 diabetes mellitus with complication, without long-term current use of insulin  (HCC) 06/25/2019   Unstable angina (HCC) 06/25/2019   Urinary frequency    Urinary urgency     Past Surgical History:  Procedure Laterality Date   ABDOMINAL HYSTERECTOMY  1972   APPENDECTOMY  1969   CATARACT EXTRACTION W/ INTRAOCULAR LENS IMPLANT Bilateral    CHOLECYSTECTOMY     COLONOSCOPY     cyst removed from ovary     knee athroscopy Right    knots removed from neck     LEFT HEART CATH AND CORONARY ANGIOGRAPHY N/A 06/27/2019   Procedure: LEFT HEART CATH AND CORONARY ANGIOGRAPHY;  Surgeon: Swaziland, Peter M, MD;  Location: [facility];  Service: Cardiovascular;  Laterality: N/A;   PARATHYROIDECTOMY Right 09/03/2020   Procedure: RIGHT INFERIOR PARATHYROIDECTOMY;  Surgeon: Eletha Boas, MD;  Location: [facility];  Service: General;  Laterality: Right;   TONSILLECTOMY  1956   TOTAL HIP ARTHROPLASTY Left 05/30/2014   Procedure: LEFT TOTAL HIP ARTHROPLASTY ANTERIOR APPROACH;  Surgeon: Maude KANDICE Herald, MD;  Location: [facility];  Service: Orthopedics;  Laterality: Left;   TOTAL HIP ARTHROPLASTY Right 04/03/2020   Procedure: RIGHT TOTAL HIP ARTHROPLASTY ANTERIOR APPROACH;  Surgeon:  Dalldorf, Peter, MD;  Location: [facility];  Service: Orthopedics;  Laterality: Right;   tumors removed from arches in feet Bilateral     Current Medications: Current Meds  Medication Sig   alendronate (FOSAMAX) 70 MG tablet Take 70 mg by mouth every Sunday. Take with a full glass of water on an empty stomach.   aspirin  EC 81 MG tablet Take 81 mg by mouth daily. Swallow whole.   Cholecalciferol (VITAMIN D3) 50 MCG (2000 UT) TABS Take 2,000 Units by mouth in the morning.   escitalopram (LEXAPRO) 20 MG tablet Take 20 mg by mouth daily.   ferrous sulfate 325 (65 FE) MG tablet Take 325 mg by mouth in  the morning.   furosemide  (LASIX ) 20 MG tablet Take 1 tablet (20 mg total) by mouth daily.   gabapentin (NEURONTIN) 300 MG capsule Take 300 mg by mouth at bedtime.   isosorbide  mononitrate (IMDUR ) 60 MG 24 hr tablet Take 1 tablet (60 mg total) by mouth daily.   loratadine (CLARITIN) 10 MG tablet Take 10 mg by mouth daily.   metFORMIN (GLUCOPHAGE-XR) 500 MG 24 hr tablet Take 500 mg by mouth in the morning.   nitroGLYCERIN  (NITROSTAT ) 0.4 MG SL tablet Place 1 tablet (0.4 mg total) under the tongue every 5 (five) minutes x 3 doses as needed for chest pain.   pantoprazole  (PROTONIX ) 40 MG tablet Take 40 mg by mouth in the morning.   [DISCONTINUED] escitalopram (LEXAPRO) 10 MG tablet Take 10 mg by mouth daily.     Allergies:   Azithromycin and Tape   Social History   Socioeconomic History   Marital status: Married    Spouse name: Not on file   Number of children: Not on file   Years of education: Not on file   Highest education level: Not on file  Occupational History   Not on file  Tobacco Use   Smoking status: Former    Current packs/day: 0.00    Average packs/day: 1 pack/day for 20.0 years (20.0 ttl pk-yrs)    Types: Cigarettes    Start date: 02/17/1984    Quit date: 02/17/2004    Years since quitting: 19.8   Smokeless tobacco: Never   Tobacco comments:    no cigs in over a week  Vaping Use   Vaping status: Never Used  Substance and Sexual Activity   Alcohol use: No   Drug use: No   Sexual activity: Yes    Birth control/protection: Surgical  Other Topics Concern   Not on file  Social History Narrative   Not on file   Social Drivers of Health   Financial Resource Strain: Not on file  Food Insecurity: Not on file  Transportation Needs: Not on file  Physical Activity: Not on file  Stress: Not on file  Social Connections: Not on file     Family History: The patient's family history includes Cancer in her sister; Hypertension in her brother and mother; Stroke in her  mother. ROS:   Please see the history of present illness.    All 14 point review of systems negative except as described per history of present illness  EKGs/Labs/Other Studies Reviewed:         Recent Labs: 09/30/2023: BUN 17; Creatinine, Ser 0.97; NT-Pro BNP 822; Potassium 5.0; Sodium 138  Recent Lipid Panel    Component Value Date/Time   CHOL 96 06/25/2019 0037   TRIG 112 06/25/2019 0037   HDL 32 (L) 06/25/2019 0037   CHOLHDL  3.0 06/25/2019 0037   VLDL 22 06/25/2019 0037   LDLCALC 42 06/25/2019 0037    Physical Exam:    VS:  BP 110/62   Pulse (!) 58   Ht 5' 5 (1.651 m)   Wt 178 lb (80.7 kg)   SpO2 97%   BMI 29.62 kg/m     Wt Readings from Last 3 Encounters:  12/10/23 178 lb (80.7 kg)  09/30/23 177 lb 9.6 oz (80.6 kg)  08/26/23 173 lb 9.6 oz (78.7 kg)     GEN:  Well nourished, well developed in no acute distress HEENT: Normal NECK: No JVD; No carotid bruits LYMPHATICS: No lymphadenopathy CARDIAC: RRR, no murmurs, no rubs, no gallops RESPIRATORY:  Clear to auscultation without rales, wheezing or rhonchi  ABDOMEN: Soft, non-tender, non-distended MUSCULOSKELETAL:  No edema; No deformity  SKIN: Warm and dry LOWER EXTREMITIES: no swelling NEUROLOGIC:  Alert and oriented x 3 PSYCHIATRIC:  Normal affect   ASSESSMENT:    1. Coronary artery disease involving native coronary artery of native heart without angina pectoris   2. Essential hypertension   3. Obstructive sleep apnea syndrome, severe   4. Hyperparathyroidism, primary (HCC)   5. Mixed hyperlipidemia    PLAN:    In order of problems listed above:  Chest pain somewhat atypical I worry about potential GERD/reflux disease.  I recommend to give Maalox Mylanta and try it when she got the symptoms to see if it helps. Essential hypertension blood pressure seems to be well-controlled continue present management. Obstructive sleep apnea: Documented by internal medicine team. Mixed dyslipidemia I did review her  K PN which show me LDL 118 HDL 57 we will start Lipitor 10   Medication Adjustments/Labs and Tests Ordered: Current medicines are reviewed at length with the patient today.  Concerns regarding medicines are outlined above.  No orders of the defined types were placed in this encounter.  Medication changes: No orders of the defined types were placed in this encounter.   Signed, Lamar DOROTHA Fitch, MD, [facility] 12/10/2023 4:13 PM    [facility]

## 2024-01-26 DIAGNOSIS — E118 Type 2 diabetes mellitus with unspecified complications: Secondary | ICD-10-CM | POA: Diagnosis not present

## 2024-01-26 DIAGNOSIS — I10 Essential (primary) hypertension: Secondary | ICD-10-CM | POA: Diagnosis not present

## 2024-01-26 DIAGNOSIS — F419 Anxiety disorder, unspecified: Secondary | ICD-10-CM | POA: Diagnosis not present

## 2024-01-26 DIAGNOSIS — Z23 Encounter for immunization: Secondary | ICD-10-CM | POA: Diagnosis not present

## 2024-01-26 DIAGNOSIS — Z6831 Body mass index (BMI) 31.0-31.9, adult: Secondary | ICD-10-CM | POA: Diagnosis not present

## 2024-01-26 DIAGNOSIS — G629 Polyneuropathy, unspecified: Secondary | ICD-10-CM | POA: Diagnosis not present

## 2024-01-26 DIAGNOSIS — E538 Deficiency of other specified B group vitamins: Secondary | ICD-10-CM | POA: Diagnosis not present

## 2024-01-26 DIAGNOSIS — K219 Gastro-esophageal reflux disease without esophagitis: Secondary | ICD-10-CM | POA: Diagnosis not present

## 2024-01-26 DIAGNOSIS — D509 Iron deficiency anemia, unspecified: Secondary | ICD-10-CM | POA: Diagnosis not present

## 2024-01-26 DIAGNOSIS — E559 Vitamin D deficiency, unspecified: Secondary | ICD-10-CM | POA: Diagnosis not present

## 2024-01-26 DIAGNOSIS — E785 Hyperlipidemia, unspecified: Secondary | ICD-10-CM | POA: Diagnosis not present

## 2024-01-26 DIAGNOSIS — F32A Depression, unspecified: Secondary | ICD-10-CM | POA: Diagnosis not present

## 2024-01-26 LAB — LAB REPORT - SCANNED
A1c: 5.6
EGFR: 68

## 2024-02-22 DIAGNOSIS — L219 Seborrheic dermatitis, unspecified: Secondary | ICD-10-CM | POA: Diagnosis not present

## 2024-02-22 DIAGNOSIS — R233 Spontaneous ecchymoses: Secondary | ICD-10-CM | POA: Diagnosis not present

## 2024-02-22 DIAGNOSIS — L300 Nummular dermatitis: Secondary | ICD-10-CM | POA: Diagnosis not present

## 2024-02-22 DIAGNOSIS — L299 Pruritus, unspecified: Secondary | ICD-10-CM | POA: Diagnosis not present

## 2024-06-15 ENCOUNTER — Ambulatory Visit: Admitting: Cardiology
